# Patient Record
Sex: Female | Born: 1985 | Race: White | Hispanic: No | Marital: Married | Smoking: Never smoker
Health system: Southern US, Community
[De-identification: ages and names within clinical notes are randomized; demographics above are authoritative.]

## PROBLEM LIST (undated history)

## (undated) ENCOUNTER — Inpatient Hospital Stay (HOSPITAL_COMMUNITY): Payer: Self-pay

## (undated) DIAGNOSIS — Z8639 Personal history of other endocrine, nutritional and metabolic disease: Secondary | ICD-10-CM

## (undated) DIAGNOSIS — Z9882 Breast implant status: Secondary | ICD-10-CM

## (undated) DIAGNOSIS — Z9089 Acquired absence of other organs: Secondary | ICD-10-CM

## (undated) DIAGNOSIS — Z8249 Family history of ischemic heart disease and other diseases of the circulatory system: Secondary | ICD-10-CM

## (undated) DIAGNOSIS — I7300 Raynaud's syndrome without gangrene: Secondary | ICD-10-CM

## (undated) DIAGNOSIS — Z818 Family history of other mental and behavioral disorders: Secondary | ICD-10-CM

## (undated) DIAGNOSIS — Z833 Family history of diabetes mellitus: Secondary | ICD-10-CM

## (undated) DIAGNOSIS — R7303 Prediabetes: Secondary | ICD-10-CM

## (undated) DIAGNOSIS — Z9049 Acquired absence of other specified parts of digestive tract: Secondary | ICD-10-CM

## (undated) DIAGNOSIS — F419 Anxiety disorder, unspecified: Secondary | ICD-10-CM

## (undated) DIAGNOSIS — Z8742 Personal history of other diseases of the female genital tract: Secondary | ICD-10-CM

## (undated) DIAGNOSIS — Z124 Encounter for screening for malignant neoplasm of cervix: Secondary | ICD-10-CM

## (undated) DIAGNOSIS — M109 Gout, unspecified: Secondary | ICD-10-CM

## (undated) DIAGNOSIS — Z9889 Other specified postprocedural states: Secondary | ICD-10-CM

## (undated) HISTORY — DX: Acquired absence of other specified parts of digestive tract: Z90.49

## (undated) HISTORY — DX: Family history of ischemic heart disease and other diseases of the circulatory system: Z82.49

## (undated) HISTORY — PX: BREAST ENHANCEMENT SURGERY: SHX7

## (undated) HISTORY — DX: Raynaud's syndrome without gangrene: I73.00

## (undated) HISTORY — DX: Encounter for screening for malignant neoplasm of cervix: Z12.4

## (undated) HISTORY — DX: Prediabetes: R73.03

## (undated) HISTORY — DX: Gout, unspecified: M10.9

## (undated) HISTORY — DX: Acquired absence of other organs: Z90.89

## (undated) HISTORY — PX: AUGMENTATION MAMMAPLASTY: SUR837

## (undated) HISTORY — DX: Personal history of other endocrine, nutritional and metabolic disease: Z86.39

## (undated) HISTORY — PX: OTHER SURGICAL HISTORY: SHX169

## (undated) HISTORY — PX: CHOLECYSTECTOMY: SHX55

## (undated) HISTORY — DX: Family history of diabetes mellitus: Z83.3

## (undated) HISTORY — DX: Anxiety disorder, unspecified: F41.9

## (undated) HISTORY — DX: Breast implant status: Z98.82

## (undated) HISTORY — DX: Personal history of other diseases of the female genital tract: Z87.42

## (undated) HISTORY — DX: Other specified postprocedural states: Z98.890

## (undated) HISTORY — DX: Family history of other mental and behavioral disorders: Z81.8

## (undated) HISTORY — PX: TONSILLECTOMY: SUR1361

---

## 2015-04-10 ENCOUNTER — Encounter: Payer: Self-pay | Admitting: Osteopathic Medicine

## 2015-04-10 ENCOUNTER — Ambulatory Visit (INDEPENDENT_AMBULATORY_CARE_PROVIDER_SITE_OTHER): Payer: 59 | Admitting: Osteopathic Medicine

## 2015-04-10 VITALS — BP 121/85 | HR 86 | Ht 70.0 in | Wt 253.0 lb

## 2015-04-10 DIAGNOSIS — Z9089 Acquired absence of other organs: Secondary | ICD-10-CM

## 2015-04-10 DIAGNOSIS — Z9049 Acquired absence of other specified parts of digestive tract: Secondary | ICD-10-CM

## 2015-04-10 DIAGNOSIS — Z8639 Personal history of other endocrine, nutritional and metabolic disease: Secondary | ICD-10-CM

## 2015-04-10 DIAGNOSIS — Z818 Family history of other mental and behavioral disorders: Secondary | ICD-10-CM

## 2015-04-10 DIAGNOSIS — F321 Major depressive disorder, single episode, moderate: Secondary | ICD-10-CM

## 2015-04-10 DIAGNOSIS — Z9882 Breast implant status: Secondary | ICD-10-CM

## 2015-04-10 DIAGNOSIS — Z833 Family history of diabetes mellitus: Secondary | ICD-10-CM

## 2015-04-10 DIAGNOSIS — F411 Generalized anxiety disorder: Secondary | ICD-10-CM

## 2015-04-10 DIAGNOSIS — Z124 Encounter for screening for malignant neoplasm of cervix: Secondary | ICD-10-CM

## 2015-04-10 DIAGNOSIS — Z9889 Other specified postprocedural states: Secondary | ICD-10-CM

## 2015-04-10 DIAGNOSIS — Z8742 Personal history of other diseases of the female genital tract: Secondary | ICD-10-CM

## 2015-04-10 DIAGNOSIS — Z8249 Family history of ischemic heart disease and other diseases of the circulatory system: Secondary | ICD-10-CM

## 2015-04-10 HISTORY — DX: Breast implant status: Z98.82

## 2015-04-10 HISTORY — DX: Encounter for screening for malignant neoplasm of cervix: Z12.4

## 2015-04-10 HISTORY — DX: Acquired absence of other organs: Z90.89

## 2015-04-10 HISTORY — DX: Personal history of other diseases of the female genital tract: Z87.42

## 2015-04-10 HISTORY — DX: Personal history of other endocrine, nutritional and metabolic disease: Z86.39

## 2015-04-10 HISTORY — DX: Other specified postprocedural states: Z98.890

## 2015-04-10 HISTORY — DX: Acquired absence of other specified parts of digestive tract: Z90.49

## 2015-04-10 HISTORY — DX: Family history of diabetes mellitus: Z83.3

## 2015-04-10 HISTORY — DX: Family history of other mental and behavioral disorders: Z81.8

## 2015-04-10 HISTORY — DX: Family history of ischemic heart disease and other diseases of the circulatory system: Z82.49

## 2015-04-10 MED ORDER — HYDROXYZINE HCL 25 MG PO TABS: 25.0000 mg | ORAL_TABLET | Freq: Three times a day (TID) | ORAL | Status: DC | PRN

## 2015-04-10 MED ORDER — PAROXETINE HCL 30 MG PO TABS: 15.0000 mg | ORAL_TABLET | Freq: Every day | ORAL | Status: DC

## 2015-04-10 NOTE — Progress Notes (Signed)
HPI: Regina Andrews is a 30 y.o. female who presents to [facility] today for chief complaint of:  Chief Complaint  Patient presents with  . Establish Care  . Anxiety     New patient here to establish care, seeking medication to help with significant anxiety issues.  Previous medications - Wellbutrin a year and a half ago. Has tried [employer] Wort, GABA, herbal tea, aromatherapy, breathing techniques. Also taking Melatonin or Z-quil tohelp sleep. Possible "slight ADHD" diagnosis as a child. Agoraphobia. Patient unable to perform daily tasks such as going to the grocery store, taking her son to doctor's appointments.  Recently moved from [place]. Had better support system with family in [place], has been now is supportive and encouraging.  Generalized Anxiety Evaluation:  Excessive anxiety/worry 6 mos+: Yes  Home and work/school: Yes  Difficulty to control: Yes  Associated symptoms (3/adult, 1/child):     Restlessness/on edge Yes      Fatigue Yes      Concentration Yes      Irritable Yes      Muscle tension No      Sleep disturbance Yes  Imparled social/occupational functioning: Yes   Any secondary cause or other concerns?   Major depression Concern  Personality d/o No concern  Social anxiety d/o Concern  Obsessive Compulsive d/o No concern  PTSD No concern  Eating d/o or Body Dysmorphic d/o No concern   Major Depression Evaluation: One of the following for 2wk, most of the day on most days?  Depression: Yes    Anhedonia/reduced interest: Yes  Four of the following, most of the day on most days?  Weight change unintentional: No   Sleep pattern change: Yes   Psychomotor symptom: Yes   Fatigue: Yes   Worthlessness/guilt: Yes   Concentration problems: Yes   Thoughts of death/suicide: No  Concern for other causes?  Drug-induced problems: No   Bipolar disorder: No   Delusional: No   Hallucinations: No   Bereavement/grief: No    Hypothyroid: await labs  Alcohol: No    Past medical, social and family history reviewed: Past Medical History  Diagnosis Date  . Cervical cancer screening 04/10/2015    Hx abn Pap 05/2014   . Family history of depression 04/10/2015    both parents   . Family history of diabetes mellitus in first degree relative 04/10/2015    mother   . Family history of heart disease 04/10/2015  . History of abdominoplasty 04/10/2015  . History of breast augmentation 04/10/2015  . History of cholecystectomy 04/10/2015  . History of goiter 04/10/2015  . History of PCOS 04/10/2015  . History of tonsillectomy 04/10/2015   No past surgical history on file. Social History  Substance Use Topics  . Smoking status: Never Smoker   . Smokeless tobacco: Not on file  . Alcohol Use: Not on file   No family history on file.  No current outpatient prescriptions on file.   No current facility-administered medications for this visit.   Not on File    Review of Systems: CONSTITUTIONAL:  No  fever, no chills, No  unintentional weight changes HEAD/EYES/EARS/NOSE/THROAT: (+) headache, no vision change, no hearing change, No  sore throat, No  sinus pressure, (+) ringing in ears/difficulty hearing  CARDIAC: No  chest pain, No  pressure, No palpitations, No  orthopnea RESPIRATORY: No  cough, No  shortness of breath/wheeze GASTROINTESTINAL: (+) nausea, no vomiting, no abdominal pain, (+) bright blood in stool, no  diarrhea, No  constipation  MUSCULOSKELETAL: No  myalgia/arthralgia GENITOURINARY: No  incontinence, No  abnormal genital bleeding/discharge SKIN: No  rash/wounds/concerning lesions HEM/ONC: No  easy bruising/bleeding, No  abnormal lymph node ENDOCRINE: No polyuria/polydipsia/polyphagia, No  heat/cold intolerance  NEUROLOGIC: No  weakness, No  dizziness, No  slurred speech PSYCHIATRIC: (+) concerns with depression, (+) concerns with anxiety, (+) sleep problems  Exam:  BP 121/85 mmHg  Pulse 86  Ht 5'  10" (1.778 m)  Wt 253 lb (114.76 kg)  BMI 36.30 kg/m2 Constitutional: VS see above. General Appearance: alert, well-developed, well-nourished, NAD Eyes: Normal lids and conjunctive, non-icteric sclera,  Ears, Nose, Mouth, Throat: MMM, Normal external inspection ears/nares/mouth/lips/gums Neck: No masses, trachea midline. No thyroid enlargement/tenderness/mass appreciated. No lymphadenopathy Respiratory: Normal respiratory effort. no wheeze, no rhonchi, no rales Cardiovascular: S1/S2 normal, no murmur, no rub/gallop auscultated. RRR.  No lower extremity edema. Skin: warm, dry, intact. No rash/ulcer. No concerning nevi or subq nodules on limited exam.   Psychiatric: Normal judgment/insight. Anxious mood and affect. Oriented x3.     ASSESSMENT/PLAN: Generalized anxiety disorder with significant impaired social and personal functioning. Her depressive disorder. Panic attacks. We'll start low-dose medicine as below, patient to call/message in 2 weeks and we can discuss increasing doses if she is tolerating the medicines well, versus switching if not. Call sooner if any concerning side effects. She is advised to stop St. John's wort and over-the-counter diet pill. ER/RTC precautions reviewed.   At next visit, will further address history of abnormal Pap once a reviewed records from GYN, also address other GI issues.   Generalized anxiety disorder - Plan: PARoxetine (PAXIL) 30 MG tablet, hydrOXYzine (ATARAX/VISTARIL) 25 MG tablet, Ambulatory referral to Psychology  Major depressive disorder, single episode, moderate (HCC)  Cervical cancer screening - Hx abn Pap 05/2014  History of PCOS  History of goiter  Family history of depression - both parents  Family history of diabetes mellitus in first degree relative - mother  Family history of heart disease  History of breast augmentation  History of abdominoplasty  History of cholecystectomy  History of tonsillectomy   Return in about 4  weeks (around 05/08/2015) for MEDICATION MANAGEMENT - OV30.

## 2015-04-10 NOTE — Patient Instructions (Signed)
You should expect a call within one week about the following:  After Dr. Lyn Hollingshead has reviewed records from previous PCP and OB/GYN, we'll see if we need to update any labs, will also give further instructions about follow-up for abnormal Pap.  You should also receive a call about scheduling an appointment with behavioral health for counseling  Please let us know right away, either by calling the office or sending a secure message through my chart, if you are experiencing any side effects that you are unable to tolerate, keeping in mind that most side effects from the new medicine will go away on their own within a week or 2. The most common problems are nausea or abdominal discomfort. If you are experiencing thoughts of hurting herself or others, call 911 or go to an emergency room right away.  Please let us know, either by calling the office or sending a secure message through my chart, after 2 weeks on the medicines to let us know how you're doing. At that point we can talk about possibly increasing the dose or switching to a different medication if you're having problems with side effects. Again, please let us know ASAP if you have a serious concern about side effect, do not wait for 2 weeks.   Please see below for further information on the medications. The list of possible side effects is provided with each, please let us know if you have any concerns about these, recognizing that is because a side effect is listed as a possibility does not guarantee that it will happen. Every patient needs to work with her doctor to discuss side effects versus benefits of any medicine.   Take care, and let us know if there is anything else we can do for you! If everything is going okay, let's plan to meet up again in the office in 4 weeks!  -Dr. Mervyn Skeeters.      Paroxetine tablets What is this medicine? PAROXETINE (pa ROX e teen) is used to treat depression. It may also be used to treat anxiety disorders,  obsessive compulsive disorder, panic attacks, post traumatic stress, and premenstrual dysphoric disorder (PMDD). This medicine may be used for other purposes; ask your health care provider or pharmacist if you have questions. How should I use this medicine? Take this medicine by mouth with a glass of water. Follow the directions on the prescription label. You can take it with or without food. Take your medicine at regular intervals. Do not take your medicine more often than directed. Do not stop taking this medicine suddenly except upon the advice of your doctor. Stopping this medicine too quickly may cause serious side effects or your condition may worsen. A special MedGuide will be given to you by the pharmacist with each prescription and refill. Be sure to read this information carefully each time. Talk to your pediatrician regarding the use of this medicine in children. Special care may be needed. Overdosage: If you think you have taken too much of this medicine contact a poison control center or emergency room at once. NOTE: This medicine is only for you. Do not share this medicine with others. What if I miss a dose? If you miss a dose, take it as soon as you can. If it is almost time for your next dose, take only that dose. Do not take double or extra doses. What may interact with this medicine? Do not take this medicine with any of the following medications: -linezolid -MAOIs like Carbex, Eldepryl,  Marplan, Nardil, and Parnate -methylene blue (injected into a vein) -pimozide -thioridazine This medicine may also interact with the following medications: -alcohol -aspirin and aspirin-like medicines -atomoxetine -certain medicines for depression, anxiety, or psychotic disturbances -certain medicines for irregular heart beat like propafenone, flecainide, encainide, and quinidine -certain medicines for migraine headache like almotriptan, eletriptan, frovatriptan, naratriptan, rizatriptan,  sumatriptan, zolmitriptan -cimetidine -digoxin -diuretics -fentanyl -fosamprenavir/ritonavir -furazolidone -isoniazid -lithium -medicines that treat or prevent blood clots like warfarin, enoxaparin, and dalteparin -medicines for sleep -metoprolol -NSAIDs, medicines for pain and inflammation, like ibuprofen or naproxen -phenobarbital -phenytoin -procarbazine -procyclidine -rasagiline -supplements like St. John's wort, kava kava, valerian -tamoxifen -theophylline -tramadol -tryptophan This list may not describe all possible interactions. Give your health care provider a list of all the medicines, herbs, non-prescription drugs, or dietary supplements you use. Also tell them if you smoke, drink alcohol, or use illegal drugs. Some items may interact with your medicine. What should I watch for while using this medicine? Tell your doctor if your symptoms do not get better or if they get worse. Visit your doctor or health care professional for regular checks on your progress. Because it may take several weeks to see the full effects of this medicine, it is important to continue your treatment as prescribed by your doctor. Patients and their families should watch out for new or worsening thoughts of suicide or depression. Also watch out for sudden changes in feelings such as feeling anxious, agitated, panicky, irritable, hostile, aggressive, impulsive, severely restless, overly excited and hyperactive, or not being able to sleep. If this happens, especially at the beginning of treatment or after a change in dose, call your health care professional. Bonita Quin may get drowsy or dizzy. Do not drive, use machinery, or do anything that needs mental alertness until you know how this medicine affects you. Do not stand or sit up quickly, especially if you are an older patient. This reduces the risk of dizzy or fainting spells. Alcohol may interfere with the effect of this medicine. Avoid alcoholic drinks. Your  mouth may get dry. Chewing sugarless gum or sucking hard candy, and drinking plenty of water will help. Contact your doctor if the problem does not go away or is severe. What side effects may I notice from receiving this medicine? Side effects that you should report to your doctor or health care professional as soon as possible: -allergic reactions like skin rash, itching or hives, swelling of the face, lips, or tongue -black or bloody stools, blood in the urine or vomit -fast, irregular heartbeat -hallucination, loss of contact with reality -painful or prolonged erection (men) -seizures -suicidal thoughts or other mood changes -trouble passing urine or change in the amount of urine -unusual bleeding or bruising -unusually weak or tired -vomiting Side effects that usually do not require medical attention (report to your doctor or health care professional if they continue or are bothersome): -change in appetite, weight -change in sex drive or performance -constipation or diarrhea -difficulty sleeping -drowsy -headache -increased sweating -muscle pain or weakness -tremors This list may not describe all possible side effects. Call your doctor for medical advice about side effects. You may report side effects to FDA at 1-800-FDA-1088. Where should I keep my medicine? Keep out of the reach of children. Store at room temperature between 15 and 30 degrees C (59 and 86 degrees F). Keep container tightly closed. Throw away any unused medicine after the expiration date. NOTE: This sheet is a summary. It may not cover all possible  information. If you have questions about this medicine, talk to your doctor, pharmacist, or health care provider.    2016, Elsevier/Gold Standard. (2011-08-27 18:10:02)   Hydroxyzine capsules or tablets What is this medicine? HYDROXYZINE (hye DROX i zeen) is an antihistamine. This medicine is used to treat allergy symptoms. It is also used to treat anxiety and  tension. This medicine can be used with other medicines to induce sleep before surgery. This medicine may be used for other purposes; ask your health care provider or pharmacist if you have questions. How should I use this medicine? Take this medicine by mouth with a full glass of water. Follow the directions on the prescription label. You may take this medicine with food or on an empty stomach. Take your medicine at regular intervals. Do not take your medicine more often than directed. Overdosage: If you think you have taken too much of this medicine contact a poison control center or emergency room at once. NOTE: This medicine is only for you. Do not share this medicine with others. What if I miss a dose? If you miss a dose, take it as soon as you can. If it is almost time for your next dose, take only that dose. Do not take double or extra doses. What may interact with this medicine? -alcohol -barbiturate medicines for sleep or seizures -medicines for colds, allergies -medicines for depression, anxiety, or emotional disturbances -medicines for pain -medicines for sleep -muscle relaxants This list may not describe all possible interactions. Give your health care provider a list of all the medicines, herbs, non-prescription drugs, or dietary supplements you use. Also tell them if you smoke, drink alcohol, or use illegal drugs. Some items may interact with your medicine. What should I watch for while using this medicine? Tell your doctor or health care professional if your symptoms do not improve. You may get drowsy or dizzy. Do not drive, use machinery, or do anything that needs mental alertness until you know how this medicine affects you. Do not stand or sit up quickly, especially if you are an older patient. This reduces the risk of dizzy or fainting spells. Alcohol may interfere with the effect of this medicine. Avoid alcoholic drinks. Your mouth may get dry. Chewing sugarless gum or sucking  hard candy, and drinking plenty of water may help. Contact your doctor if the problem does not go away or is severe. This medicine may cause dry eyes and blurred vision. If you wear contact lenses you may feel some discomfort. Lubricating drops may help. See your eye doctor if the problem does not go away or is severe. If you are receiving skin tests for allergies, tell your doctor you are using this medicine. What side effects may I notice from receiving this medicine? Side effects that you should report to your doctor or health care professional as soon as possible: -fast or irregular heartbeat -difficulty passing urine -seizures -slurred speech or confusion -tremor Side effects that usually do not require medical attention (report to your doctor or health care professional if they continue or are bothersome): -constipation -drowsiness -fatigue -headache -stomach upset This list may not describe all possible side effects. Call your doctor for medical advice about side effects. You may report side effects to FDA at 1-800-FDA-1088. Where should I keep my medicine? Keep out of the reach of children. Store at room temperature between 15 and 30 degrees C (59 and 86 degrees F). Keep container tightly closed. Throw away any unused medicine after the  expiration date. NOTE: This sheet is a summary. It may not cover all possible information. If you have questions about this medicine, talk to your doctor, pharmacist, or health care provider.    2016, Elsevier/Gold Standard. (2007-05-27 14:50:59)

## 2015-04-10 NOTE — Progress Notes (Signed)
GAD 7 : Generalized Anxiety Score 04/10/2015  Nervous, Anxious, on Edge 3  Control/stop worrying 3  Worry too much - different things 3  Trouble relaxing 3  Restless 3  Easily annoyed or irritable 3  Afraid - awful might happen 3  Total GAD 7 Score 21  Anxiety Difficulty Extremely difficult     Depression screen PHQ 2/9 04/10/2015  Decreased Interest 3  Down, Depressed, Hopeless 3  PHQ - 2 Score 6  Altered sleeping 3  Tired, decreased energy 3  Change in appetite 3  Feeling bad or failure about yourself  3  Trouble concentrating 2  Moving slowly or fidgety/restless 2  Suicidal thoughts 0  PHQ-9 Score 22  Difficult doing work/chores Very difficult

## 2015-04-16 ENCOUNTER — Encounter: Payer: Self-pay | Admitting: Osteopathic Medicine

## 2015-04-16 ENCOUNTER — Ambulatory Visit (INDEPENDENT_AMBULATORY_CARE_PROVIDER_SITE_OTHER): Payer: 59 | Admitting: Osteopathic Medicine

## 2015-04-16 VITALS — BP 109/75 | HR 75 | Ht 70.0 in | Wt 257.0 lb

## 2015-04-16 DIAGNOSIS — N926 Irregular menstruation, unspecified: Secondary | ICD-10-CM | POA: Diagnosis not present

## 2015-04-16 DIAGNOSIS — Z8742 Personal history of other diseases of the female genital tract: Secondary | ICD-10-CM

## 2015-04-16 DIAGNOSIS — F411 Generalized anxiety disorder: Secondary | ICD-10-CM | POA: Diagnosis not present

## 2015-04-16 LAB — POCT URINE PREGNANCY: PREG TEST UR: NEGATIVE

## 2015-04-16 NOTE — Progress Notes (Signed)
HPI: Regina Andrews is a 30 y.o. female who [facility] to [facility] today for chief complaint of:  Chief Complaint  Patient presents with  . (+) pregnancy test at home    Recently seen in office, Dx GAD and started Paxil and Hydroxyzine, referred to counseling. Pt had (+) pregnancy test at home and is concerned that the new medications caused this to happen. Patient's last menstrual period was 03/13/2015 (exact date). Last had sex 04/01/15. Periods fairly regular since losing some weight. Hx PCOS "borderline" in the past.   Past medical, social and family history reviewed: Past Medical History  Diagnosis Date  . Cervical cancer screening 04/10/2015    Hx abn Pap 05/2014   . Family history of depression 04/10/2015    both parents   . Family history of diabetes mellitus in first degree relative 04/10/2015    mother   . Family history of heart disease 04/10/2015  . History of abdominoplasty 04/10/2015  . History of breast augmentation 04/10/2015  . History of cholecystectomy 04/10/2015  . History of goiter 04/10/2015  . History of PCOS 04/10/2015  . History of tonsillectomy 04/10/2015   Past Surgical History  Procedure Laterality Date  . Breast enhancement surgery    . Tummy tuck    . Cholecystectomy    . Tonsillectomy     Social History  Substance Use Topics  . Smoking status: Never Smoker   . Smokeless tobacco: Not on file  . Alcohol Use: Not on file   Family History  Problem Relation Age of Onset  . Depression Mother   . Diabetes Mother   . Depression Father   . Hyperlipidemia Father   . Hypertension Father   . Cancer Paternal Aunt     Current Outpatient Prescriptions  Medication Sig Dispense Refill  . 5-Hydroxytryptophan (5-HTP MAXIMUM STRENGTH) 200 MG CAPS Take 200 mg by mouth daily.    . Calcium Carb-Cholecalciferol (CALCIUM + D3) 600-200 MG-UNIT TABS Take by mouth daily. Take 1 tablet daily    . Cholecalciferol (VITAMIN D3) 2000 units TABS  Take by mouth daily.    . Coenzyme Q10 (CO Q-10) 100 MG CAPS Take 100 mg by mouth daily.    . Ferrous Sulfate (IRON) 325 (65 Fe) MG TABS Take by mouth daily.    . folic acid (FOLVITE) 800 MCG tablet Take 400 mcg by mouth daily.    . hydrOXYzine (ATARAX/VISTARIL) 25 MG tablet Take 1-2 tablets (25-50 mg total) by mouth 3 (three) times daily as needed for anxiety (insomnia). 90 tablet 1  . L-Glutamine 500 MG TABS Take by mouth.    . Lutein 20 MG TABS Take 20 mg by mouth daily. TAKE 1 TABLET DAILY    . Magnesium 400 MG TABS Take 400 mg by mouth daily.    . Melatonin 5 MG TABS Take 5 mg by mouth daily.    . Omega-3 Fatty Acids (FISH OIL) 1000 MG CAPS Take 1,000 mg by mouth daily.    [name]. PARoxetine (PAXIL) 30 MG tablet Take 0.5 tablets (15 mg total) by mouth daily. Can increase to 1 tablet (30 mg total) after 2 (two) weeks if you are tolerating the medicine well but not getting much relief of symptoms 30 tablet 1  . Selenium (SELENICAPS-200 PO) Take 200 mg by mouth daily.    . Zinc 50 MG TABS Take 50 mg by mouth daily.     No current facility-administered medications for this visit.   No  Known Allergies    Review of Systems: CONSTITUTIONAL:  No  fever, no chills, No  unintentional weight changes CARDIAC: No  chest pain GASTROINTESTINAL: No  nausea, No  vomiting, No  abdominal pain,  GENITOURINARY: No  incontinence, No  abnormal genital bleeding/discharge, period a bit last we per HPI PSYCHIATRIC: No  concerns with depression, (+) concerns with anxiety, No sleep problems  Exam:  BP 109/75 mmHg  Pulse 75  Ht  (1.778 m)  Wt 257 lb (116.574 kg)  BMI 36.88 kg/m2 Constitutional: VS see above. General Appearance: alert, well-developed, well-nourished, NAD Psychiatric: Normal judgment/insight. Normal mood and affect. Oriented x3.    Results for orders placed or performed in visit on 04/16/15 (from the past 72 hour(s))  POCT urine pregnancy     Status: None   Collection Time: 04/16/15   9:47 AM  Result Value Ref Range   Preg Test, Ur Negative Negative      ASSESSMENT/PLAN: UPT neg, pt reassured, if no period in one week will get serum hcg, advised current meds relatively safe for pregnancy but I cannot guarantee safety of herbal OTC meds. Birth control importance discussed - pt opts for condom use/abstinence, will think about pills.   Missed period - Plan: POCT urine pregnancy  Generalized anxiety disorder  History of PCOS     No Follow-up on file.

## 2015-04-23 ENCOUNTER — Ambulatory Visit (INDEPENDENT_AMBULATORY_CARE_PROVIDER_SITE_OTHER): Payer: 59 | Admitting: Osteopathic Medicine

## 2015-04-23 ENCOUNTER — Encounter: Payer: Self-pay | Admitting: Osteopathic Medicine

## 2015-04-23 ENCOUNTER — Telehealth: Payer: Self-pay | Admitting: Osteopathic Medicine

## 2015-04-23 VITALS — BP 117/80 | HR 103 | Ht 70.0 in | Wt 257.0 lb

## 2015-04-23 DIAGNOSIS — N926 Irregular menstruation, unspecified: Secondary | ICD-10-CM | POA: Diagnosis not present

## 2015-04-23 DIAGNOSIS — F411 Generalized anxiety disorder: Secondary | ICD-10-CM | POA: Diagnosis not present

## 2015-04-23 DIAGNOSIS — Z3481 Encounter for supervision of other normal pregnancy, first trimester: Secondary | ICD-10-CM

## 2015-04-23 LAB — POCT URINE PREGNANCY: PREG TEST UR: POSITIVE — AB

## 2015-04-23 MED ORDER — SERTRALINE HCL 50 MG PO TABS: 50.0000 mg | ORAL_TABLET | Freq: Every day | ORAL | Status: DC

## 2015-04-23 NOTE — Patient Instructions (Signed)
We will set up referral for OB/GYN in this building - you can walk in to the office and schedule an appointment if you would like.  We will look into the referral for behavioral health/mental health counseling - there are offices on the first floor of this building, he can also walk into that office and inquire about the referral today if you would like.   We have placed orders for blood tests to confirm pregnancy hormone levels and ultrasound for accurate dating of pregnancy. We can get this blood work done at [facility]. Ultrasound will call you to schedule an appointment.   We are switching to Zoloft, discontinue the Paxil. Start Zoloft at one half tablet daily for a week before going up to a full tablet. Any problems with this medicine, please let us know right away. Any thoughts of hurting yourself or others, call 911 or go to the nearest emergency department.  Any questions or concerns, please let us know! Left plan to follow-up in another month or so to address medications, sooner if needed or if any problems.

## 2015-04-23 NOTE — Telephone Encounter (Signed)
Received message patient is scheduled with Regina Andrews [facility] and is aware of the appointment . - CF

## 2015-04-23 NOTE — Progress Notes (Signed)
HPI: Regina Andrews is a 30 y.o. female who presents to [facility] today for chief complaint of: No chief complaint on file.   Preg test was neg alst week, plan was to get serum hcg if still no periods, pt came in today and preg test here (+). Plans to continue w/ pregnancy.   ANXIETY -    Past medical, social and family history reviewed: Past Medical History  Diagnosis Date  . Cervical cancer screening 04/10/2015    Hx abn Pap 05/2014   . Family history of depression 04/10/2015    both parents   . Family history of diabetes mellitus in first degree relative 04/10/2015    mother   . Family history of heart disease 04/10/2015  . History of abdominoplasty 04/10/2015  . History of breast augmentation 04/10/2015  . History of cholecystectomy 04/10/2015  . History of goiter 04/10/2015  . History of PCOS 04/10/2015  . History of tonsillectomy 04/10/2015   Past Surgical History  Procedure Laterality Date  . Breast enhancement surgery    . Tummy tuck    . Cholecystectomy    . Tonsillectomy     Social History  Substance Use Topics  . Smoking status: Never Smoker   . Smokeless tobacco: Not on file  . Alcohol Use: Not on file   Family History  Problem Relation Age of Onset  . Depression Mother   . Diabetes Mother   . Depression Father   . Hyperlipidemia Father   . Hypertension Father   . Cancer Paternal Aunt     Current Outpatient Prescriptions  Medication Sig Dispense Refill  . 5-Hydroxytryptophan (5-HTP MAXIMUM STRENGTH) 200 MG CAPS Take 200 mg by mouth daily.    . Calcium Carb-Cholecalciferol (CALCIUM + D3) 600-200 MG-UNIT TABS Take by mouth daily. Take 1 tablet daily    . Cholecalciferol (VITAMIN D3) 2000 units TABS Take by mouth daily.    . Coenzyme Q10 (CO Q-10) 100 MG CAPS Take 100 mg by mouth daily.    . Ferrous Sulfate (IRON) 325 (65 Fe) MG TABS Take by mouth daily.    . folic acid (FOLVITE) 800 MCG tablet Take 400 mcg by mouth daily.    .  hydrOXYzine (ATARAX/VISTARIL) 25 MG tablet Take 1-2 tablets (25-50 mg total) by mouth 3 (three) times daily as needed for anxiety (insomnia). 90 tablet 1  . L-Glutamine 500 MG TABS Take by mouth.    . Lutein 20 MG TABS Take 20 mg by mouth daily. TAKE 1 TABLET DAILY    . Magnesium 400 MG TABS Take 400 mg by mouth daily.    . Melatonin 5 MG TABS Take 5 mg by mouth daily.    . Omega-3 Fatty Acids (FISH OIL) 1000 MG CAPS Take 1,000 mg by mouth daily.    [name] PARoxetine (PAXIL) 30 MG tablet Take 0.5 tablets (15 mg total) by mouth daily. Can increase to 1 tablet (30 mg total) after 2 (two) weeks if you are tolerating the medicine well but not getting much relief of symptoms 30 tablet 1  . Selenium (SELENICAPS-200 PO) Take 200 mg by mouth daily.    . Zinc 50 MG TABS Take 50 mg by mouth daily.     No current facility-administered medications for this visit.   No Known Allergies    Review of Systems: CONSTITUTIONAL:  No  fever, no chills, No  unintentional weight changes GENITOURINARY:  No  abnormal genital bleeding/discharge PSYCHIATRIC: No  concerns with  depression, (+) concerns with anxiety, (+) sleep problems  Exam:  BP 117/80 mmHg  Pulse 103  Ht 5\' 10"  (1.778 m)  Wt 257 lb (116.574 kg)  BMI 36.88 kg/m2  LMP 03/13/2015 (Exact Date) Constitutional: VS see above. General Appearance: alert, well-developed, well-nourished, NAD Psychiatric: Normal judgment/insight. Anxiousmood and affect. Oriented x3.    Results for orders placed or performed in visit on 04/23/15 (from the past 72 hour(s))  POCT urine pregnancy     Status: Abnormal   Collection Time: 04/23/15  9:18 AM  Result Value Ref Range   Preg Test, Ur Positive (A) Negative      ASSESSMENT/PLAN:  Missed period - Plan: POCT urine pregnancy  Generalized anxiety disorder - Plan: sertraline (ZOLOFT) 50 MG tablet  Encounter for supervision of other normal pregnancy in first trimester - Plan: Ambulatory referral to Obstetrics /  Gynecology, hCG, serum, qualitative, US OB Comp Less 14 Wks   Return in about 4 weeks (around 05/21/2015), or sooner if needed, for MEDICATION MANAGEMENT.

## 2015-04-24 ENCOUNTER — Encounter: Payer: Self-pay | Admitting: Osteopathic Medicine

## 2015-04-24 ENCOUNTER — Telehealth: Payer: Self-pay | Admitting: Osteopathic Medicine

## 2015-04-24 LAB — HCG, SERUM, QUALITATIVE: Preg, Serum: POSITIVE — ABNORMAL HIGH

## 2015-04-24 NOTE — Telephone Encounter (Signed)
error 

## 2015-04-25 ENCOUNTER — Ambulatory Visit: Payer: 59 | Admitting: Licensed Clinical Social Worker

## 2015-04-26 ENCOUNTER — Other Ambulatory Visit: Payer: Self-pay | Admitting: Osteopathic Medicine

## 2015-04-26 ENCOUNTER — Ambulatory Visit (INDEPENDENT_AMBULATORY_CARE_PROVIDER_SITE_OTHER): Payer: 59

## 2015-04-26 DIAGNOSIS — Z3490 Encounter for supervision of normal pregnancy, unspecified, unspecified trimester: Secondary | ICD-10-CM

## 2015-04-26 DIAGNOSIS — Z3481 Encounter for supervision of other normal pregnancy, first trimester: Secondary | ICD-10-CM

## 2015-04-26 DIAGNOSIS — O3481 Maternal care for other abnormalities of pelvic organs, first trimester: Secondary | ICD-10-CM | POA: Diagnosis not present

## 2015-04-26 DIAGNOSIS — Z3A01 Less than 8 weeks gestation of pregnancy: Secondary | ICD-10-CM

## 2015-04-26 DIAGNOSIS — R58 Hemorrhage, not elsewhere classified: Secondary | ICD-10-CM | POA: Diagnosis not present

## 2015-05-08 ENCOUNTER — Ambulatory Visit: Payer: 59 | Admitting: Osteopathic Medicine

## 2015-05-09 ENCOUNTER — Ambulatory Visit (INDEPENDENT_AMBULATORY_CARE_PROVIDER_SITE_OTHER): Payer: 59 | Admitting: Licensed Clinical Social Worker

## 2015-05-09 DIAGNOSIS — F410 Panic disorder [episodic paroxysmal anxiety] without agoraphobia: Secondary | ICD-10-CM

## 2015-05-13 ENCOUNTER — Ambulatory Visit (INDEPENDENT_AMBULATORY_CARE_PROVIDER_SITE_OTHER): Payer: 59 | Admitting: Advanced Practice Midwife

## 2015-05-13 ENCOUNTER — Encounter: Payer: Self-pay | Admitting: Advanced Practice Midwife

## 2015-05-13 VITALS — BP 112/75 | HR 82 | Wt 249.0 lb

## 2015-05-13 DIAGNOSIS — Z3481 Encounter for supervision of other normal pregnancy, first trimester: Secondary | ICD-10-CM

## 2015-05-13 DIAGNOSIS — Z124 Encounter for screening for malignant neoplasm of cervix: Secondary | ICD-10-CM | POA: Diagnosis not present

## 2015-05-13 DIAGNOSIS — Z1151 Encounter for screening for human papillomavirus (HPV): Secondary | ICD-10-CM | POA: Diagnosis not present

## 2015-05-13 DIAGNOSIS — Z113 Encounter for screening for infections with a predominantly sexual mode of transmission: Secondary | ICD-10-CM

## 2015-05-13 DIAGNOSIS — Z36 Encounter for antenatal screening of mother: Secondary | ICD-10-CM | POA: Diagnosis not present

## 2015-05-13 DIAGNOSIS — E049 Nontoxic goiter, unspecified: Secondary | ICD-10-CM

## 2015-05-13 DIAGNOSIS — Z3480 Encounter for supervision of other normal pregnancy, unspecified trimester: Secondary | ICD-10-CM

## 2015-05-13 DIAGNOSIS — Z3491 Encounter for supervision of normal pregnancy, unspecified, first trimester: Secondary | ICD-10-CM

## 2015-05-13 HISTORY — DX: Encounter for supervision of other normal pregnancy, unspecified trimester: Z34.80

## 2015-05-13 NOTE — Patient Instructions (Signed)
First Trimester of Pregnancy The first trimester of pregnancy is from week 1 until the end of week 12 (months 1 through 3). A week after a sperm fertilizes an egg, the egg will implant on the wall of the uterus. This embryo will begin to develop into a baby. Genes from you and your partner are forming the baby. The female genes determine whether the baby is a boy or a girl. At 6-8 weeks, the eyes and face are formed, and the heartbeat can be seen on ultrasound. At the end of 12 weeks, all the baby's organs are formed.  Now that you are pregnant, you will want to do everything you can to have a healthy baby. Two of the most important things are to get good prenatal care and to follow your health care provider's instructions. Prenatal care is all the medical care you receive before the baby's birth. This care will help prevent, find, and treat any problems during the pregnancy and childbirth. BODY CHANGES Your body goes through many changes during pregnancy. The changes vary from woman to woman.   You may gain or lose a couple of pounds at first.  You may feel sick to your stomach (nauseous) and throw up (vomit). If the vomiting is uncontrollable, call your health care provider.  You may tire easily.  You may develop headaches that can be relieved by medicines approved by your health care provider.  You may urinate more often. Painful urination may mean you have a bladder infection.  You may develop heartburn as a result of your pregnancy.  You may develop constipation because certain hormones are causing the muscles that push waste through your intestines to slow down.  You may develop hemorrhoids or swollen, bulging veins (varicose veins).  Your breasts may begin to grow larger and become tender. Your nipples may stick out more, and the tissue that surrounds them (areola) may become darker.  Your gums may bleed and may be sensitive to brushing and flossing.  Dark spots or blotches (chloasma,  mask of pregnancy) may develop on your face. This will likely fade after the baby is born.  Your menstrual periods will stop.  You may have a loss of appetite.  You may develop cravings for certain kinds of food.  You may have changes in your emotions from day to day, such as being excited to be pregnant or being concerned that something may go wrong with the pregnancy and baby.  You may have more vivid and strange dreams.  You may have changes in your hair. These can include thickening of your hair, rapid growth, and changes in texture. Some women also have hair loss during or after pregnancy, or hair that feels dry or thin. Your hair will most likely return to normal after your baby is born. WHAT TO EXPECT AT YOUR PRENATAL VISITS During a routine prenatal visit:  You will be weighed to make sure you and the baby are growing normally.  Your blood pressure will be taken.  Your abdomen will be measured to track your baby's growth.  The fetal heartbeat will be listened to starting around week 10 or 12 of your pregnancy.  Test results from any previous visits will be discussed. Your health care provider may ask you:  How you are feeling.  If you are feeling the baby move.  If you have had any abnormal symptoms, such as leaking fluid, bleeding, severe headaches, or abdominal cramping.  If you are using any tobacco products,   including cigarettes, chewing tobacco, and electronic cigarettes.  If you have any questions. Other tests that may be performed during your first trimester include:  Blood tests to find your blood type and to check for the presence of any previous infections. They will also be used to check for low iron levels (anemia) and Rh antibodies. Later in the pregnancy, blood tests for diabetes will be done along with other tests if problems develop.  Urine tests to check for infections, diabetes, or protein in the urine.  An ultrasound to confirm the proper growth  and development of the baby.  An amniocentesis to check for possible genetic problems.  Fetal screens for spina bifida and Down syndrome.  You may need other tests to make sure you and the baby are doing well.  HIV (human immunodeficiency virus) testing. Routine prenatal testing includes screening for HIV, unless you choose not to have this test. HOME CARE INSTRUCTIONS  Medicines  Follow your health care provider's instructions regarding medicine use. Specific medicines may be either safe or unsafe to take during pregnancy.  Take your prenatal vitamins as directed.  If you develop constipation, try taking a stool softener if your health care provider approves. Diet  Eat regular, well-balanced meals. Choose a variety of foods, such as meat or vegetable-based protein, fish, milk and low-fat dairy products, vegetables, fruits, and whole grain breads and cereals. Your health care provider will help you determine the amount of weight gain that is right for you.  Avoid raw meat and uncooked cheese. These carry germs that can cause birth defects in the baby.  Eating four or five small meals rather than three large meals a day may help relieve nausea and vomiting. If you start to feel nauseous, eating a few soda crackers can be helpful. Drinking liquids between meals instead of during meals also seems to help nausea and vomiting.  If you develop constipation, eat more high-fiber foods, such as fresh vegetables or fruit and whole grains. Drink enough fluids to keep your urine clear or pale yellow. Activity and Exercise  Exercise only as directed by your health care provider. Exercising will help you:  Control your weight.  Stay in shape.  Be prepared for labor and delivery.  Experiencing pain or cramping in the lower abdomen or low back is a good sign that you should stop exercising. Check with your health care provider before continuing normal exercises.  Try to avoid standing for long  periods of time. Move your legs often if you must stand in one place for a long time.  Avoid heavy lifting.  Wear low-heeled shoes, and practice good posture.  You may continue to have sex unless your health care provider directs you otherwise. Relief of Pain or Discomfort  Wear a good support bra for breast tenderness.   Take warm sitz baths to soothe any pain or discomfort caused by hemorrhoids. Use hemorrhoid cream if your health care provider approves.   Rest with your legs elevated if you have leg cramps or low back pain.  If you develop varicose veins in your legs, wear support hose. Elevate your feet for 15 minutes, 3-4 times a day. Limit salt in your diet. Prenatal Care  Schedule your prenatal visits by the twelfth week of pregnancy. They are usually scheduled monthly at first, then more often in the last 2 months before delivery.  Write down your questions. Take them to your prenatal visits.  Keep all your prenatal visits as directed by your   health care provider. Safety  Wear your seat belt at all times when driving.  Make a list of emergency phone numbers, including numbers for family, friends, the hospital, and police and fire departments. General Tips  Ask your health care provider for a referral to a local prenatal education class. Begin classes no later than at the beginning of month 6 of your pregnancy.  Ask for help if you have counseling or nutritional needs during pregnancy. Your health care provider can offer advice or refer you to specialists for help with various needs.  Do not use hot tubs, steam rooms, or saunas.  Do not douche or use tampons or scented sanitary pads.  Do not cross your legs for long periods of time.  Avoid cat litter boxes and soil used by cats. These carry germs that can cause birth defects in the baby and possibly loss of the fetus by miscarriage or stillbirth.  Avoid all smoking, herbs, alcohol, and medicines not prescribed by  your health care provider. Chemicals in these affect the formation and growth of the baby.  Do not use any tobacco products, including cigarettes, chewing tobacco, and electronic cigarettes. If you need help quitting, ask your health care provider. You may receive counseling support and other resources to help you quit.  Schedule a dentist appointment. At home, brush your teeth with a soft toothbrush and be gentle when you floss. SEEK MEDICAL CARE IF:   You have dizziness.  You have mild pelvic cramps, pelvic pressure, or nagging pain in the abdominal area.  You have persistent nausea, vomiting, or diarrhea.  You have a bad smelling vaginal discharge.  You have pain with urination.  You notice increased swelling in your face, hands, legs, or ankles. SEEK IMMEDIATE MEDICAL CARE IF:   You have a fever.  You are leaking fluid from your vagina.  You have spotting or bleeding from your vagina.  You have severe abdominal cramping or pain.  You have rapid weight gain or loss.  You vomit blood or material that looks like coffee grounds.  You are exposed to German measles and have never had them.  You are exposed to fifth disease or chickenpox.  You develop a severe headache.  You have shortness of breath.  You have any kind of trauma, such as from a fall or a car accident.   This information is not intended to replace advice given to you by your health care provider. Make sure you discuss any questions you have with your health care provider.   Document Released: 01/06/2001 Document Revised: 02/02/2014 Document Reviewed: 11/22/2012 Elsevier Interactive Patient Education 2016 [employer]  

## 2015-05-13 NOTE — Progress Notes (Signed)
New OB   See SmartSet  Subjective:    Regina Andrews is a G2P1001 7225w5d being seen today for her first obstetrical visit.  Her obstetrical history is significant for obesity and Hx abnormal pap, Von Willebrands Type 2.  Also has goiter and anxiety (on Zoloft)   Patient does intend to breast feed. Pregnancy history fully reviewed.  Patient reports no complaints.  Filed Vitals:   05/13/15 1007  BP: 112/75  Pulse: 82  Weight: 112.946 kg (249 lb)    HISTORY: OB History  Gravida Para Term Preterm AB SAB TAB Ectopic Multiple Living  2 1 1       1     # Outcome Date GA Lbr Len/2nd Weight Sex Delivery Anes PTL Lv  2 Current           1 Term 10/03/09 202w0d   M Vag-Spont   Y     Past Medical History  Diagnosis Date  . Cervical cancer screening 04/10/2015    Hx abn Pap 05/2014   . Family history of depression 04/10/2015    both parents   . Family history of diabetes mellitus in first degree relative 04/10/2015    mother   . Family history of heart disease 04/10/2015  . History of abdominoplasty 04/10/2015  . History of breast augmentation 04/10/2015  . History of cholecystectomy 04/10/2015  . History of goiter 04/10/2015  . History of PCOS 04/10/2015  . History of tonsillectomy 04/10/2015  . 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 Anxiety    Past Surgical History  Procedure Laterality Date  . Breast enhancement surgery    . Tummy tuck    . Cholecystectomy    . Tonsillectomy     Family History  Problem Relation Age of Onset  . Depression Mother   . Diabetes Mother   . Depression Father   . Hyperlipidemia Father   . Hypertension Father   . Cancer Paternal Aunt      Exam    Uterus:  Fundal Height: 8 cm  Pelvic Exam:    Perineum: No Hemorrhoids, Normal Perineum   Vulva: Bartholin's, Urethra, Skene's normal   Vagina:  normal mucosa, normal discharge    pH:    Cervix: multiparous appearance and no lesions   Adnexa: no mass, fullness, tenderness   Bony Pelvis: gynecoid  System: Breast:  normal appearance, no masses or tenderness   Skin: normal coloration and turgor, no rashes    Neurologic: oriented, grossly non-focal   Extremities: normal strength, tone, and muscle mass   HEENT neck supple with midline trachea   Mouth/Teeth mucous membranes moist, pharynx normal without lesions   Neck supple   Cardiovascular: regular rate and rhythm   Respiratory:  appears well, vitals normal, no respiratory distress, acyanotic, normal RR, ear and throat exam is normal, neck free of mass or lymphadenopathy, chest clear, no wheezing, crepitations, rhonchi, normal symmetric air entry   Abdomen: soft, non-tender; bowel sounds normal; no masses,  no organomegaly   Urinary: urethral meatus normal      Assessment:    Pregnancy: G2P1001 Patient Active Problem List   Diagnosis Date Noted  . Supervision of normal subsequent pregnancy 05/13/2015  . Generalized anxiety disorder 04/16/2015  . Anxiety state 04/10/2015  . Cervical cancer screening 04/10/2015  . History of PCOS 04/10/2015  . History of goiter 04/10/2015  .  Family history of depression 04/10/2015  . Family history of diabetes mellitus in first degree relative 04/10/2015  . Family history of heart disease 04/10/2015  . History of breast augmentation 04/10/2015  . History of abdominoplasty 04/10/2015  . History of cholecystectomy 04/10/2015  . History of tonsillectomy 04/10/2015        Plan:     Initial labs drawn. Prenatal vitamins. Problem list reviewed and updated. Genetic Screening discussed First Screen: ordered.  Ultrasound discussed; fetal survey: requested.  Follow up in 4 weeks. 50% of 30 min visit spent on counseling and coordination of care.   Consulted Dr Adrian Blackwater, no tests needed for Von Willebrands, may need consult Heme TSH for hx goiter Routines  reviewed   [facility] 05/13/2015

## 2015-05-13 NOTE — Progress Notes (Signed)
Von willebrand Type 2 per pt Bedside [place] shows single IUP measuring 8024w3d and FHR 176bpm

## 2015-05-14 LAB — PRENATAL PROFILE (SOLSTAS)
Antibody Screen: NEGATIVE
BASOS PCT: 0 %
Basophils Absolute: 0 cells/uL (ref 0–200)
EOS ABS: 86 {cells}/uL (ref 15–500)
Eosinophils Relative: 1 %
HEMATOCRIT: 38.8 % (ref 35.0–45.0)
HEP B S AG: NEGATIVE
HIV: NONREACTIVE
Hemoglobin: 13 g/dL (ref 11.7–15.5)
LYMPHS ABS: 1290 {cells}/uL (ref 850–3900)
LYMPHS PCT: 15 %
MCH: 31 pg (ref 27.0–33.0)
MCHC: 33.5 g/dL (ref 32.0–36.0)
MCV: 92.4 fL (ref 80.0–100.0)
MPV: 10 fL (ref 7.5–12.5)
Monocytes Absolute: 602 cells/uL (ref 200–950)
Monocytes Relative: 7 %
NEUTROS ABS: 6622 {cells}/uL (ref 1500–7800)
Neutrophils Relative %: 77 %
PLATELETS: 305 10*3/uL (ref 140–400)
RBC: 4.2 MIL/uL (ref 3.80–5.10)
RDW: 12.5 % (ref 11.0–15.0)
RH TYPE: POSITIVE
Rubella: 2.05 Index — ABNORMAL HIGH (ref <0.90)
WBC: 8.6 10*3/uL (ref 3.8–10.8)

## 2015-05-14 LAB — CYTOLOGY - PAP

## 2015-05-14 LAB — TSH: TSH: 1.09 m[IU]/L

## 2015-05-14 LAB — GC/CHLAMYDIA PROBE AMP (~~LOC~~) NOT AT ARMC
Chlamydia: NEGATIVE
Neisseria Gonorrhea: NEGATIVE

## 2015-05-15 ENCOUNTER — Ambulatory Visit (INDEPENDENT_AMBULATORY_CARE_PROVIDER_SITE_OTHER): Payer: 59 | Admitting: Osteopathic Medicine

## 2015-05-15 ENCOUNTER — Encounter: Payer: Self-pay | Admitting: Osteopathic Medicine

## 2015-05-15 VITALS — BP 122/79 | HR 86 | Ht 70.0 in | Wt 254.0 lb

## 2015-05-15 DIAGNOSIS — F411 Generalized anxiety disorder: Secondary | ICD-10-CM

## 2015-05-15 LAB — HEMOGLOBINOPATHY EVALUATION
HEMOGLOBIN OTHER: 0 %
HGB A: 97.2 % (ref 96.8–97.8)
HGB S QUANTITAION: 0 %
Hgb A2 Quant: 2.8 % (ref 2.2–3.2)
Hgb F Quant: 0 % (ref 0.0–2.0)

## 2015-05-15 LAB — CULTURE, OB URINE

## 2015-05-15 MED ORDER — SERTRALINE HCL 50 MG PO TABS: 50.0000 mg | ORAL_TABLET | Freq: Every day | ORAL | Status: DC

## 2015-05-15 NOTE — Progress Notes (Signed)
HPI: Regina Andrews is a 30 y.o. female who presents to [facility] today for chief complaint of:  Chief Complaint  Patient presents with  . Anxiety    ANXIETY . Quality: waking up feeling panic, falling feeling. She is worried about the side effects possible in pregnancy.  . Severity: not getting worse, but not much better  . Duration: many years . Modifying factors: Zoloft recently, paxil prior to pregnancy, hydroxyzine prn but she isn't taking this, she is going to counseling, has had 1 appt so far . Assoc signs/symptoms: no SI/HI   Past medical, social and family history reviewed:no changes needed .   Current Outpatient Prescriptions  Medication Sig Dispense Refill  . Ascorbic Acid (VITAMIN C) 100 MG tablet Take 100 mg by mouth daily.    . Calcium Carb-Cholecalciferol (CALCIUM + D3) 600-200 MG-UNIT TABS Take by mouth daily. Take 1 tablet daily    . Cholecalciferol (VITAMIN D3) 2000 units TABS Take by mouth daily.    [name] doxylamine, Sleep, (UNISOM) 25 MG tablet Take 25 mg by mouth at bedtime as needed.    . Ferrous Sulfate (IRON) 325 (65 Fe) MG TABS Take by mouth daily.    . folic acid (FOLVITE) 400 MCG tablet Take 400 mcg by mouth daily.    . hydrOXYzine (ATARAX/VISTARIL) 25 MG tablet Take 1-2 tablets (25-50 mg total) by mouth 3 (three) times daily as needed for anxiety (insomnia). 90 tablet 1  . Prenatal Vit-Fe Fumarate-FA (MULTIVITAMIN-PRENATAL) 27-0.8 MG TABS tablet Take 1 tablet by mouth daily at 12 noon.    . pyridoxine (B-6) 100 MG tablet Take 100 mg by mouth daily.    . sertraline (ZOLOFT) 50 MG tablet Take 1 tablet (50 mg total) by mouth daily. Start with 1/2 tablet daily (25 mg total) for first week, then increase to 1 tablet. 30 tablet 1  . Zinc 50 MG TABS Take 50 mg by mouth daily.     No current facility-administered medications for this visit.   No Known Allergies    Review of Systems: CONSTITUTIONAL: no recet  illness HEAD/EYES/EARS/NOSE/THROAT: No  headache, no vision change, CARDIAC: No  chest pain, No  pressure, No palpitations RESPIRATORY: No  cough, No  shortness of breath/wheeze PSYCHIATRIC: No  concerns with depression, (+) concerns with anxiety, No sleep problems  Exam:  BP 122/79 mmHg  Pulse 86  Ht '5\' 10"'  (1.778 m)  Wt 254 lb (115.214 kg)  BMI 36.45 kg/m2  LMP 03/13/2015 (Exact Date) Constitutional: VS see above. General Appearance: alert, well-developed, well-nourished, NAD Psychiatric: Normal judgment/insight. Anxious mood and affect. Oriented x3. No SI/HI, no thought disorder.    Results for orders placed or performed in visit on 05/13/15 (from the past 72 hour(s))  Cytology - PAP     Status: None   Collection Time: 05/13/15 12:00 AM  Result Value Ref Range   CYTOLOGY - PAP PAP RESULT   GC/Chlamydia probe amp ([place])not at [facility]     Status: None   Collection Time: 05/13/15 12:00 AM  Result Value Ref Range   Chlamydia Negative     Comment: Normal Reference Range - Negative   Neisseria gonorrhea Negative     Comment: Normal Reference Range - Negative  Prenatal Profile     Status: Abnormal   Collection Time: 05/13/15 11:06 AM  Result Value Ref Range   WBC 8.6 3.8 - 10.8 K/uL   RBC 4.20 3.80 - 5.10 MIL/uL   Hemoglobin 13.0 11.7 -  15.5 g/dL   HCT 38.8 35.0 - 45.0 %   MCV 92.4 80.0 - 100.0 fL   MCH 31.0 27.0 - 33.0 pg   MCHC 33.5 32.0 - 36.0 g/dL   RDW 12.5 11.0 - 15.0 %   Platelets 305 140 - 400 K/uL   MPV 10.0 7.5 - 12.5 fL   Neutro Abs 6622 1500 - 7800 cells/uL   Lymphs Abs 1290 850 - 3900 cells/uL   Monocytes Absolute 602 200 - 950 cells/uL   Eosinophils Absolute 86 15 - 500 cells/uL   Basophils Absolute 0 0 - 200 cells/uL   Neutrophils Relative % 77 %   Lymphocytes Relative 15 %   Monocytes Relative 7 %   Eosinophils Relative 1 %   Basophils Relative 0 %   Smear Review Criteria for review not met    Hepatitis B Surface Ag NEGATIVE NEGATIVE   HIV 1&2 Ab,  4th Generation NONREACTIVE NONREACTIVE    Comment:   HIV-1 antigen and HIV-1/HIV-2 antibodies were not detected.  There is no laboratory evidence of HIV infection.   HIV-1/2 Antibody Diff        Not indicated. HIV-1 RNA, Qual TMA          Not indicated.      RPR Ser Ql NON REAC NON REAC   Rubella 2.05 (H) <0.90 Index    Comment:                  Index          Interpretation                =====          ==============                <0.90          Not consistent with immunity                0.90-0.99      Equivocal                >=1.00         Consistent with immunity   The presence of Rubella IgG antibody suggests immunization or past or current infection with Rubella virus.    ABO Grouping O    Rh Type POS    Antibody Screen NEG NEGATIVE    Comment:   PLEASE NOTE: This information has been disclosed to you from records whose confidentiality may be protected by state law. If your state requires such protection, then the state law prohibits you from making any further disclosure of the information without the specific written consent of the person to whom it pertains, or as otherwise permitted by law. A general authorization for the release of medical or other information is NOT sufficient for this purpose.   The performance of this assay has not been clinically validated in patients less than 14 years old.   For additional information please refer to http://education.quest[employer].com/faq/FAQ106.  (This link is being provided for informational/educational purposes only.)   ** Please note change in unit of measure and reference range(s). **   Culture, OB Urine     Status: None   Collection Time: 05/13/15 11:06 AM  Result Value Ref Range   Colony Count 15,000 COLONIES/ML    Organism ID, Bacteria Multiple bacterial morphotypes present, none    Organism ID, Bacteria predominant. Suggest appropriate recollection if     Organism ID, Bacteria clinically indicated.  Hemoglobinopathy evaluation     Status: None (Preliminary result)   Collection Time: 05/13/15 11:06 AM  Result Value Ref Range   Hgb A  96.8 - 97.8 %   Hgb A2 Quant  2.2 - 3.2 %   Hgb F Quant  0.0 - 2.0 %   Hgb S Quant  0.0 %   Hemoglobin Other  0.0 %  TSH     Status: None   Collection Time: 05/13/15 12:25 PM  Result Value Ref Range   TSH 1.09 mIU/L    Comment:   Reference Range   > or = 20 Years  0.40-4.50   Pregnancy Range First trimester  0.26-2.66 Second trimester 0.55-2.73 Third trimester  0.43-2.91         ASSESSMENT/PLAN: Would continue Zoloft to allow 6 weeksof therapy and hpoefully will note improvement/stabilization, at that point we can think abotu switching Rx or increasing dose, use Hydroxyzine in the meantime as bridge, continue counseling. If she is really worried about the risks of the medicine, which were reviewed to her as small but not nonexistent and more concerning in 3rd TM use, she can stop medications or have a discussion with her OBGYN for second opinion - this is her decision. She opts to conitnue and recheck in office here. ER/RTC precautions reviewed.   Generalized anxiety disorder - Plan: sertraline (ZOLOFT) 50 MG tablet   Return in about 4 weeks (around 06/11/2015), or soonerif needed, for ANXIETY FOLLOWUP.  Total time spent 25 minutes, greater than 50% of the visit was face-to-face counseling and coordinating care for diagnosis of anxiety and medication management.

## 2015-05-21 ENCOUNTER — Ambulatory Visit: Payer: 59 | Admitting: Osteopathic Medicine

## 2015-05-23 ENCOUNTER — Ambulatory Visit (INDEPENDENT_AMBULATORY_CARE_PROVIDER_SITE_OTHER): Payer: 59 | Admitting: Licensed Clinical Social Worker

## 2015-05-23 DIAGNOSIS — F410 Panic disorder [episodic paroxysmal anxiety] without agoraphobia: Secondary | ICD-10-CM

## 2015-05-28 ENCOUNTER — Encounter (HOSPITAL_COMMUNITY): Payer: Self-pay | Admitting: Advanced Practice Midwife

## 2015-06-06 ENCOUNTER — Ambulatory Visit (INDEPENDENT_AMBULATORY_CARE_PROVIDER_SITE_OTHER): Payer: 59 | Admitting: Licensed Clinical Social Worker

## 2015-06-06 DIAGNOSIS — F410 Panic disorder [episodic paroxysmal anxiety] without agoraphobia: Secondary | ICD-10-CM | POA: Diagnosis not present

## 2015-06-10 ENCOUNTER — Encounter: Payer: Self-pay | Admitting: Advanced Practice Midwife

## 2015-06-11 ENCOUNTER — Encounter (HOSPITAL_COMMUNITY): Payer: Self-pay

## 2015-06-11 ENCOUNTER — Ambulatory Visit (HOSPITAL_COMMUNITY)
Admission: RE | Admit: 2015-06-11 | Discharge: 2015-06-11 | Disposition: A | Discharge status: Home / Self Care | Payer: 59 | Source: Ambulatory Visit | Attending: Family Medicine | Admitting: Family Medicine

## 2015-06-11 ENCOUNTER — Encounter: Payer: Self-pay | Admitting: Osteopathic Medicine

## 2015-06-11 ENCOUNTER — Ambulatory Visit (INDEPENDENT_AMBULATORY_CARE_PROVIDER_SITE_OTHER): Payer: 59 | Admitting: Osteopathic Medicine

## 2015-06-11 ENCOUNTER — Other Ambulatory Visit: Payer: Self-pay | Admitting: Advanced Practice Midwife

## 2015-06-11 VITALS — BP 116/81 | HR 90 | Ht 70.0 in | Wt 255.0 lb

## 2015-06-11 DIAGNOSIS — Z369 Encounter for antenatal screening, unspecified: Secondary | ICD-10-CM

## 2015-06-11 DIAGNOSIS — R110 Nausea: Secondary | ICD-10-CM

## 2015-06-11 DIAGNOSIS — F411 Generalized anxiety disorder: Secondary | ICD-10-CM | POA: Diagnosis not present

## 2015-06-11 DIAGNOSIS — Z3A12 12 weeks gestation of pregnancy: Secondary | ICD-10-CM

## 2015-06-11 DIAGNOSIS — O99211 Obesity complicating pregnancy, first trimester: Secondary | ICD-10-CM | POA: Insufficient documentation

## 2015-06-11 DIAGNOSIS — F321 Major depressive disorder, single episode, moderate: Secondary | ICD-10-CM | POA: Diagnosis not present

## 2015-06-11 DIAGNOSIS — Z36 Encounter for antenatal screening of mother: Secondary | ICD-10-CM | POA: Insufficient documentation

## 2015-06-11 DIAGNOSIS — R55 Syncope and collapse: Secondary | ICD-10-CM

## 2015-06-11 DIAGNOSIS — Z3491 Encounter for supervision of normal pregnancy, unspecified, first trimester: Secondary | ICD-10-CM

## 2015-06-11 MED ORDER — ONDANSETRON 8 MG PO TBDP: 8.0000 mg | ORAL_TABLET | Freq: Three times a day (TID) | ORAL | Status: DC | PRN

## 2015-06-11 NOTE — Progress Notes (Signed)
HPI: Regina Andrews is a 30 y.o. female who presents to [facility] today for chief complaint of:  No chief complaint on file.   ANXIETY . Quality: anxiety overall better but still present  . Duration: many years . Modifying factors: At last visit, opted to continue Zoloft to allow 6 weeks of therapy to hopefully note improvement/stabilization, feels like sometimes it's helping, able to leave the house more often, using Hydroxyzine in the meantime as bridge, continuing counseling, has had 3 appt so far.  . Assoc signs/symptoms: no SI/HI  DENTAL WORK RECENTLY - given hydrocodone/acetaminophen and penicillin, asks if these are okay to take with pregnancy. Dentist did notice she was  'PASSED OUT'  . Quality: Lightheaded, no fall . Timing: See below . Context: Has history of similar vasovagal reactions, particularly with blood work. This passing out occurrence happened once at the dentist office, happened once yesterday when home from dentist office - felt like was going to vomit and when she bent down she felt like she was going to pass out again so ran to bedroom and lied down  . Assoc signs/symptoms: No head trauma, no fall  Past medical, social and family history reviewed:no changes needed .   Current Outpatient Prescriptions  Medication Sig Dispense Refill  . Ascorbic Acid (VITAMIN C) 100 MG tablet Take 100 mg by mouth daily.    . Calcium Carb-Cholecalciferol (CALCIUM + D3) 600-200 MG-UNIT TABS Take by mouth daily. Take 1 tablet daily    . Cholecalciferol (VITAMIN D3) 2000 units TABS Take by mouth daily.    [name] doxylamine, Sleep, (UNISOM) 25 MG tablet Take 25 mg by mouth at bedtime as needed.    . Ferrous Sulfate (IRON) 325 (65 Fe) MG TABS Take by mouth daily.    . folic acid (FOLVITE) 800 MCG tablet Take 400 mcg by mouth daily.    . hydrOXYzine (ATARAX/VISTARIL) 25 MG tablet Take 1-2 tablets (25-50 mg total) by mouth 3 (three) times daily as needed for  anxiety (insomnia). (Patient not taking: Reported on 05/15/2015) 90 tablet 1  . Prenatal Vit-Fe Fumarate-FA (MULTIVITAMIN-PRENATAL) 27-0.8 MG TABS tablet Take 1 tablet by mouth daily at 12 noon.    . pyridoxine (B-6) 100 MG tablet Take 100 mg by mouth daily.    . sertraline (ZOLOFT) 50 MG tablet Take 1 tablet (50 mg total) by mouth daily. 30 tablet 1  . Zinc 50 MG TABS Take 50 mg by mouth daily.     No current facility-administered medications for this visit.   No Known Allergies    Review of Systems: CONSTITUTIONAL: no recet illness HEAD/EYES/EARS/NOSE/THROAT: No  headache, no vision change, CARDIAC: No  chest pain, No  pressure, No palpitations RESPIRATORY: No  cough, No  shortness of breath/wheeze PSYCHIATRIC: No  concerns with depression, (+) concerns with anxiety, No sleep problems  Exam:  BP 116/81 mmHg  Pulse 90  Ht  (1.778 m)  Wt 255 lb (115.667 kg)  BMI 36.59 kg/m2  LMP 03/13/2015 (Exact Date) Constitutional: VS see above. General Appearance: alert, well-developed, well-nourished, NAD Psychiatric: Normal judgment/insight. Anxious mood and affect. Oriented x3. No SI/HI, no thought disorder.    No results found for this or any previous visit (from the past 72 hour(s)).    ASSESSMENT/PLAN:   If she is really worried about the risks of the medicine, which were reviewed to her as small but not nonexistent and more concerning in 3rd TM use, she can stop medications or  have a discussion with her OBGYN for second opinion - this is her decision. She is opting to stay on current dose of Zoloft, advised we can consider increasing or she can talk to her OB more about this. Would recommend continued counseling. Regarding her "passing out" episodes yesterday, sounds like vasovagal reaction, patient declines further blood work or work up, she just wanted to let me know as an Financial plannerYI. I advised that we could at least get a blood count and EKG to make sure everything is good there but she  declines. ER/RTC precautions reviewed.   Major depressive disorder, single episode, moderate (HCC)  Generalized anxiety disorder  Nausea without vomiting - Plan: ondansetron (ZOFRAN-ODT) 8 MG disintegrating tablet  Vasovagal near syncope   Return in about 8 weeks (around 08/06/2015) for ANXIETY FOLLOWUP.  Total time spent 25 minutes, greater than 50% of the visit was face-to-face counseling and coordinating care for diagnosis of anxiety and medication management.

## 2015-06-14 ENCOUNTER — Ambulatory Visit (INDEPENDENT_AMBULATORY_CARE_PROVIDER_SITE_OTHER): Payer: 59 | Admitting: Family

## 2015-06-14 VITALS — BP 113/78 | HR 88 | Wt 256.0 lb

## 2015-06-14 DIAGNOSIS — Z3481 Encounter for supervision of other normal pregnancy, first trimester: Secondary | ICD-10-CM

## 2015-06-14 DIAGNOSIS — R55 Syncope and collapse: Secondary | ICD-10-CM

## 2015-06-14 NOTE — Progress Notes (Signed)
Subjective:  Regina Andrews is a 30 y.o. G2P1001 at 3479w2d being seen today for ongoing prenatal care.  She is currently monitored for the following issues for this high-risk pregnancy and has Anxiety state; Cervical cancer screening; History of PCOS; History of goiter; Family history of depression; Family history of diabetes mellitus in first degree relative; Family history of heart disease; History of breast augmentation; History of abdominoplasty; History of cholecystectomy; History of tonsillectomy; Generalized anxiety disorder; Supervision of normal subsequent pregnancy; and Vasovagal near syncope on her problem list.  Patient reports increased concern regarding weight gain.  Ganied 60 lbs with son.  Contractions: Not present. Vag. Bleeding: None.  Movement: Absent. Denies leaking of fluid.   The following portions of the patient's history were reviewed and updated as appropriate: allergies, current medications, past family history, past medical history, past social history, past surgical history and problem list. Problem list updated.  Objective:   Filed Vitals:   06/14/15 1009  BP: 113/78  Pulse: 88  Weight: 256 lb (116.121 kg)    Fetal Status: Fetal Heart Rate (bpm): 165 Fundal Height: 14 cm Movement: Absent     General:  Alert, oriented and cooperative. Patient is in no acute distress.  Skin: Skin is warm and dry. No rash noted.   Cardiovascular: Normal heart rate noted  Respiratory: Normal respiratory effort, no problems with respiration noted  Abdomen: Soft, gravid, appropriate for gestational age. Pain/Pressure: Present     Pelvic: Vag. Bleeding: None Vag D/C Character: Thin   Cervical exam deferred        Extremities: Normal range of motion.  Edema: None  Mental Status: Normal mood and affect. Normal behavior. Normal judgment and thought content.   Urinalysis: Urine Protein: Negative Urine Glucose: Negative  Assessment and Plan:  Pregnancy: G2P1001 at 3179w2d  1. Encounter  for supervision of other normal pregnancy in first trimester - Discussed normal weight gain for her starting BMI - Reviewed nutrition strategies - AFP next visit - Reviewed NT results  General obstetric precautions including but not limited to vaginal bleeding and pelvic pain reviewed in detail with the patient. Please refer to After Visit Summary for other counseling recommendations.  Return in about 4 weeks (around 07/12/2015).   Eino FarberWalidah Kennith GainN Karim, CNM

## 2015-06-17 ENCOUNTER — Encounter: Payer: Self-pay | Admitting: *Deleted

## 2015-06-18 ENCOUNTER — Other Ambulatory Visit (HOSPITAL_COMMUNITY): Payer: Self-pay

## 2015-06-20 ENCOUNTER — Encounter: Payer: Self-pay | Admitting: Osteopathic Medicine

## 2015-06-20 ENCOUNTER — Ambulatory Visit (INDEPENDENT_AMBULATORY_CARE_PROVIDER_SITE_OTHER): Payer: 59 | Admitting: Licensed Clinical Social Worker

## 2015-06-20 DIAGNOSIS — F410 Panic disorder [episodic paroxysmal anxiety] without agoraphobia: Secondary | ICD-10-CM

## 2015-06-20 DIAGNOSIS — Z87898 Personal history of other specified conditions: Secondary | ICD-10-CM

## 2015-06-20 DIAGNOSIS — Z8719 Personal history of other diseases of the digestive system: Secondary | ICD-10-CM | POA: Insufficient documentation

## 2015-06-20 HISTORY — DX: Personal history of other specified conditions: Z87.898

## 2015-07-02 ENCOUNTER — Other Ambulatory Visit: Payer: Self-pay | Admitting: Osteopathic Medicine

## 2015-07-02 DIAGNOSIS — F411 Generalized anxiety disorder: Secondary | ICD-10-CM

## 2015-07-02 MED ORDER — SERTRALINE HCL 50 MG PO TABS: 50.0000 mg | ORAL_TABLET | Freq: Every day | ORAL | Status: DC

## 2015-07-04 ENCOUNTER — Ambulatory Visit (INDEPENDENT_AMBULATORY_CARE_PROVIDER_SITE_OTHER): Payer: 59 | Admitting: Licensed Clinical Social Worker

## 2015-07-04 DIAGNOSIS — F410 Panic disorder [episodic paroxysmal anxiety] without agoraphobia: Secondary | ICD-10-CM

## 2015-07-12 ENCOUNTER — Ambulatory Visit (INDEPENDENT_AMBULATORY_CARE_PROVIDER_SITE_OTHER): Payer: 59

## 2015-07-12 ENCOUNTER — Ambulatory Visit (INDEPENDENT_AMBULATORY_CARE_PROVIDER_SITE_OTHER): Payer: 59 | Admitting: Family

## 2015-07-12 VITALS — BP 103/78 | HR 93 | Wt 257.0 lb

## 2015-07-12 DIAGNOSIS — Z3482 Encounter for supervision of other normal pregnancy, second trimester: Secondary | ICD-10-CM

## 2015-07-12 DIAGNOSIS — M799 Soft tissue disorder, unspecified: Secondary | ICD-10-CM

## 2015-07-12 DIAGNOSIS — Z36 Encounter for antenatal screening of mother: Secondary | ICD-10-CM | POA: Diagnosis not present

## 2015-07-12 DIAGNOSIS — D680 Von Willebrand's disease: Secondary | ICD-10-CM

## 2015-07-12 DIAGNOSIS — M7989 Other specified soft tissue disorders: Secondary | ICD-10-CM

## 2015-07-12 DIAGNOSIS — Z3492 Encounter for supervision of normal pregnancy, unspecified, second trimester: Secondary | ICD-10-CM

## 2015-07-12 DIAGNOSIS — D68020 Von Willebrand disease, type 2a: Secondary | ICD-10-CM

## 2015-07-12 DIAGNOSIS — Z9889 Other specified postprocedural states: Secondary | ICD-10-CM

## 2015-07-12 NOTE — Progress Notes (Signed)
Subjective:  Regina Andrews is a [age] y.o. G2P1001 at 6266w2d being seen today for ongoing prenatal care.  She is currently monitored for the following issues for this high-risk pregnancy and has Anxiety state; Cervical cancer screening; History of PCOS; History of goiter; Family history of depression; Family history of diabetes mellitus in first degree relative; Family history of heart disease; History of breast augmentation; History of abdominoplasty; History of cholecystectomy; History of tonsillectomy; Generalized anxiety disorder; Supervision of normal subsequent pregnancy; Vasovagal near syncope; History of dysphagia; and Von Willebrand disease, type IIa (HCC) on her problem list.  Patient reports soft tissue mass above pelvic bone, nontender. May be going to [place] towards end of pregnancy until baby 366 weeks old.   Contractions: Not present. Vag. Bleeding: None.  Movement: Absent. Denies leaking of fluid.   The following portions of the patient's history were reviewed and updated as appropriate: allergies, current medications, past family history, past medical history, past social history, past surgical history and problem list. Problem list updated.  Objective:   Filed Vitals:   07/12/15 0903  BP: 103/78  Pulse: 93  Weight: 257 lb (116.574 kg)    Fetal Status: Fetal Heart Rate (bpm): 155 Fundal Height: 18 cm Movement: Absent     General:  Alert, oriented and cooperative. Patient is in no acute distress.  Skin: Skin is warm and dry. No rash noted.   Cardiovascular: Normal heart rate noted  Respiratory: Normal respiratory effort, no problems with respiration noted  Abdomen: Soft, gravid, appropriate for gestational age. Pain/Pressure: Absent     Pelvic: Cervical exam deferred        Extremities: Normal range of motion.  Edema: None  Mental Status: Normal mood and affect. Normal behavior. Normal judgment and thought content.   Urinalysis: Urine Protein: Negative Urine Glucose:  Negative  Assessment and Plan:  Pregnancy: G2P1001 at 4666w2d  1. Normal pregnancy, second trimester - Alpha fetoprotein, maternal - US MFM OB COMP + 14 WK; Future  2. . Von Willebrand disease, type IIa (HCC) - Close observation in labor  3. Soft tissue mass - Soft tissue ultrasound ordered - US Misc Soft Tissue; Future  5. History of abdominoplasty - Recommend periodic growth ultrasound  Preterm labor symptoms and general obstetric precautions including but not limited to vaginal bleeding, contractions, leaking of fluid and fetal movement were reviewed in detail with the patient. Please refer to After Visit Summary for other counseling recommendations.  Return in about 4 weeks (around 08/09/2015).   Eino FarberWalidah Kennith GainN Karim, CNM

## 2015-07-17 LAB — ALPHA FETOPROTEIN, MATERNAL
AFP: 28.2 ng/mL
CURR GEST AGE: 17.3 wk
MoM for AFP: 1.01
Open Spina bifida: NEGATIVE

## 2015-07-24 ENCOUNTER — Ambulatory Visit (HOSPITAL_COMMUNITY)
Admission: RE | Admit: 2015-07-24 | Discharge: 2015-07-24 | Disposition: A | Discharge status: Home / Self Care | Payer: 59 | Source: Ambulatory Visit | Attending: Family | Admitting: Family

## 2015-07-24 DIAGNOSIS — O99212 Obesity complicating pregnancy, second trimester: Secondary | ICD-10-CM | POA: Insufficient documentation

## 2015-07-24 DIAGNOSIS — Z3A19 19 weeks gestation of pregnancy: Secondary | ICD-10-CM | POA: Diagnosis not present

## 2015-07-24 DIAGNOSIS — Z3492 Encounter for supervision of normal pregnancy, unspecified, second trimester: Secondary | ICD-10-CM

## 2015-07-24 DIAGNOSIS — Z3482 Encounter for supervision of other normal pregnancy, second trimester: Secondary | ICD-10-CM

## 2015-07-24 DIAGNOSIS — Z36 Encounter for antenatal screening of mother: Secondary | ICD-10-CM | POA: Diagnosis not present

## 2015-07-25 ENCOUNTER — Ambulatory Visit (INDEPENDENT_AMBULATORY_CARE_PROVIDER_SITE_OTHER): Payer: 59 | Admitting: Licensed Clinical Social Worker

## 2015-07-25 DIAGNOSIS — F410 Panic disorder [episodic paroxysmal anxiety] without agoraphobia: Secondary | ICD-10-CM

## 2015-08-06 ENCOUNTER — Ambulatory Visit (INDEPENDENT_AMBULATORY_CARE_PROVIDER_SITE_OTHER): Payer: 59 | Admitting: Osteopathic Medicine

## 2015-08-06 ENCOUNTER — Encounter: Payer: Self-pay | Admitting: Osteopathic Medicine

## 2015-08-06 VITALS — BP 104/71 | HR 87 | Ht 70.0 in | Wt 261.0 lb

## 2015-08-06 DIAGNOSIS — F411 Generalized anxiety disorder: Secondary | ICD-10-CM | POA: Diagnosis not present

## 2015-08-06 DIAGNOSIS — F321 Major depressive disorder, single episode, moderate: Secondary | ICD-10-CM | POA: Diagnosis not present

## 2015-08-06 NOTE — Assessment & Plan Note (Signed)
Has been on Zoloft and Hydroxyzine

## 2015-08-06 NOTE — Progress Notes (Signed)
HPI: Regina Andrews is a 30 y.o. Not Hispanic or Latino female  who presents to [facility] today, 08/06/2015,  for chief complaint of:  Chief Complaint  Patient presents with  . Follow-up    ANXIETY    ANXIETY/DEPRESSION . Context: pregnant and has been very nervous about taking medications but is doing well on Zoloft after sticking it out to get to a steady state. Following with OBGYN.  . Quality: anxious leaving the house and coming to the doctor.  . Severity: not as bad as it was. Zoloft 50 mg dong well.  . Modifying factors: Zoloft daily. Counseling is helping.    Past medical, surgical, social and family history reviewed: Past Medical History  Diagnosis Date  . Cervical cancer screening 04/10/2015    Hx abn Pap 05/2014   . Family history of depression 04/10/2015    both parents   . Family history of diabetes mellitus in first degree relative 04/10/2015    mother   . Family history of heart disease 04/10/2015  . History of abdominoplasty 04/10/2015  . History of breast augmentation 04/10/2015  . History of cholecystectomy 04/10/2015  . History of goiter 04/10/2015  . History of PCOS 04/10/2015  . History of tonsillectomy 04/10/2015  . 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 Anxiety    Past Surgical History  Procedure Laterality Date  . Breast enhancement surgery    . Tummy tuck    . Cholecystectomy    . Tonsillectomy     Social History  Substance Use Topics  . Smoking status: Never Smoker   . Smokeless tobacco: Not on file  . Alcohol Use: Not on file   Family History  Problem Relation Age of Onset  . Depression Mother   . Diabetes Mother   . Depression Father   . Hyperlipidemia Father   . Hypertension Father   . Cancer Paternal Aunt      Current medication list and allergy/intolerance information  reviewed:   Current Outpatient Prescriptions  Medication Sig Dispense Refill  . Ascorbic Acid (VITAMIN C) 100 MG tablet Take 100 mg by mouth daily.    . Calcium Carb-Cholecalciferol (CALCIUM + D3) 600-200 MG-UNIT TABS Take by mouth daily. Take 1 tablet daily    . chlorhexidine (PERIDEX) 0.12 % solution     . Cholecalciferol (VITAMIN D3) 2000 units TABS Take by mouth daily.    [name] doxylamine, Sleep, (UNISOM) 25 MG tablet Take 25 mg by mouth at bedtime as needed.    . Ferrous Sulfate (IRON) 325 (65 Fe) MG TABS Take by mouth daily.    . folic acid (FOLVITE) 800 MCG tablet Take 400 mcg by mouth daily.    . hydrOXYzine (ATARAX/VISTARIL) 25 MG tablet Take 1-2 tablets (25-50 mg total) by mouth 3 (three) times daily as needed for anxiety (insomnia). 90 tablet 1  . ondansetron (ZOFRAN-ODT) 8 MG disintegrating tablet Take 1 tablet (8 mg total) by mouth every 8 (eight) hours as needed for nausea. 20 tablet 3  . Prenatal Vit-Fe Fumarate-FA (MULTIVITAMIN-PRENATAL) 27-0.8 MG TABS tablet Take 1 tablet by mouth daily at 12 noon.    . pyridoxine (B-6) 100 MG tablet Take 100 mg by mouth daily.    . sertraline (ZOLOFT) 50 MG tablet Take 1 tablet (50 mg total) by mouth daily. 90 tablet 1  .  Zinc 50 MG TABS Take 50 mg by mouth daily.     No current facility-administered medications for this visit.   No Known Allergies    Review of Systems:  Constitutional:  No  fever, no chills, No recent illness  GYN: No cramping/bleeding  Psychiatric: No  concerns with depression, No  concerns with anxiety, No sleep problems, No mood problems  Exam:  LMP 03/13/2015 (Exact Date)  Constitutional: VS see above. General Appearance: alert, well-developed, well-nourished, NAD  Psychiatric: Normal judgment/insight. Normal mood and affect. Oriented x3. No SI/HI. No thought disorder.     ASSESSMENT/PLAN:   Doing well, plan to continue Zoloft through pregnancy and delivery as long as it's ok with OBGYN. Followup here as  needed / if any changes. She has 6 mos of meds, should last through December this year but planning to go to [place] in the fall, deliver there, stay for awhile after.   Generalized anxiety disorder  Major depressive disorder, single episode, moderate (HCC)     Visit summary with medication list and pertinent instructions was printed for patient to review. All questions at time of visit were answered - patient instructed to contact office with any additional concerns. ER/RTC precautions were reviewed with the patient. Follow-up plan: Return in about 6 months (around 02/06/2016), or as needed if anxiety/depression changesor gets worse, otherwise continue Zoloft, for ANXIETY FOLLOW-UP.  Note: Total time spent 15 minutes, greater than 50% of the visit was spent face-to-face counseling and coordinating care for the following: The primary encounter diagnosis was Generalized anxiety disorder. A diagnosis of Major depressive disorder, single episode, moderate (HCC) was also pertinent to this visit..[name]

## 2015-08-09 ENCOUNTER — Encounter: Payer: Self-pay | Admitting: Advanced Practice Midwife

## 2015-08-12 ENCOUNTER — Ambulatory Visit (INDEPENDENT_AMBULATORY_CARE_PROVIDER_SITE_OTHER): Payer: 59 | Admitting: Advanced Practice Midwife

## 2015-08-12 VITALS — BP 109/72 | HR 78 | Wt 259.0 lb

## 2015-08-12 DIAGNOSIS — IMO0002 Reserved for concepts with insufficient information to code with codable children: Secondary | ICD-10-CM

## 2015-08-12 DIAGNOSIS — Z3482 Encounter for supervision of other normal pregnancy, second trimester: Secondary | ICD-10-CM

## 2015-08-12 DIAGNOSIS — Z1389 Encounter for screening for other disorder: Secondary | ICD-10-CM

## 2015-08-12 DIAGNOSIS — Z3A23 23 weeks gestation of pregnancy: Secondary | ICD-10-CM

## 2015-08-12 DIAGNOSIS — Z363 Encounter for antenatal screening for malformations: Secondary | ICD-10-CM

## 2015-08-12 DIAGNOSIS — Z0489 Encounter for examination and observation for other specified reasons: Secondary | ICD-10-CM

## 2015-08-12 NOTE — Progress Notes (Addendum)
Subjective:  Regina Andrews is a 30 y.o. G2P1001 at 2128w5d being seen today for ongoing prenatal care.  She is currently monitored for the following issues for this low-risk pregnancy and has Anxiety state; Cervical cancer screening; History of PCOS; History of goiter; Family history of depression; Family history of diabetes mellitus in first degree relative; Family history of heart disease; History of breast augmentation; History of abdominoplasty; History of cholecystectomy; History of tonsillectomy; Generalized anxiety disorder; Supervision of normal subsequent pregnancy; Vasovagal near syncope; History of dysphagia; and Von Willebrand disease, type IIa (HCC) on her problem list.  Patient reports dizziness after lunch some days, worse yesterday. No near-syncope, heart palpitations, CP or SOB. Worked outside most of the day. Is having protein and complex carbs w/ each meal. Not having snacks. Very concerned abotu weight gain due to excessive weight gain w/ last pregnancy.  Contractions: Not present. Vag. Bleeding: None.  Movement: Present. Denies leaking of fluid.   The following portions of the patient's history were reviewed and updated as appropriate: allergies, current medications, past family history, past medical history, past social history, past surgical history and problem list. Problem list updated.  Objective:   Filed Vitals:   08/12/15 0830  BP: 109/72  Pulse: 78  Weight: 259 lb (117.482 kg)    Fetal Status: Fetal Heart Rate (bpm): 160 Fundal Height: 22 cm Movement: Present     General:  Alert, oriented and cooperative. Patient is in no acute distress. Anxious.   Skin: Skin is warm and dry. No rash noted.   Cardiovascular: Normal heart rate noted  Respiratory: Normal respiratory effort, no problems with respiration noted  Abdomen: Soft, gravid, appropriate for gestational age. Pain/Pressure: Absent     Pelvic:  Cervical exam deferred        Extremities: Normal range of motion.   Edema: None  Mental Status: Normal mood and affect. Normal behavior. Normal judgment and thought content.   Urinalysis: Urine Protein: Trace Urine Glucose: Negative  Assessment and Plan:  Pregnancy: G2P1001 at 4128w5d  1. Encounter for supervision of other normal pregnancy in second trimester  - US MFM OB FOLLOW UP; Future  2. Encounter for routine screening for malformation using ultrasonics  - US MFM OB FOLLOW UP; Future  3. [redacted] weeks gestation of pregnancy  - US MFM OB FOLLOW UP; Future  4. Evaluate anatomy not seen on prior sonogram  - US MFM OB FOLLOW UP; Future  Preterm labor symptoms and general obstetric precautions including but not limited to vaginal bleeding, contractions, leaking of fluid and fetal movement were reviewed in detail with the patient. Please refer to After Visit Summary for other counseling recommendations.  Recommend increasing water intake when outside or sweating a lot. Have healthy snack w/ protein in it when feeling dizzy.  Return in about 4 weeks (around 09/09/2015).   Dorathy KinsmanVirginia Bram Hottel, CNM

## 2015-08-12 NOTE — Patient Instructions (Signed)

## 2015-08-15 ENCOUNTER — Ambulatory Visit: Payer: 59 | Admitting: Licensed Clinical Social Worker

## 2015-08-26 ENCOUNTER — Encounter (HOSPITAL_COMMUNITY): Payer: Self-pay

## 2015-08-26 ENCOUNTER — Ambulatory Visit (HOSPITAL_COMMUNITY)
Admission: RE | Admit: 2015-08-26 | Discharge: 2015-08-26 | Disposition: A | Discharge status: Home / Self Care | Payer: 59 | Source: Ambulatory Visit | Attending: Advanced Practice Midwife | Admitting: Advanced Practice Midwife

## 2015-08-26 ENCOUNTER — Inpatient Hospital Stay (HOSPITAL_COMMUNITY)
Admission: AD | Admit: 2015-08-26 | Discharge: 2015-08-26 | Disposition: A | Discharge status: Home / Self Care | Payer: 59 | Source: Ambulatory Visit | Attending: Obstetrics and Gynecology | Admitting: Obstetrics and Gynecology

## 2015-08-26 DIAGNOSIS — Z3A23 23 weeks gestation of pregnancy: Secondary | ICD-10-CM | POA: Insufficient documentation

## 2015-08-26 DIAGNOSIS — O99112 Other diseases of the blood and blood-forming organs and certain disorders involving the immune mechanism complicating pregnancy, second trimester: Secondary | ICD-10-CM | POA: Insufficient documentation

## 2015-08-26 DIAGNOSIS — Z3482 Encounter for supervision of other normal pregnancy, second trimester: Secondary | ICD-10-CM

## 2015-08-26 DIAGNOSIS — Z0489 Encounter for examination and observation for other specified reasons: Secondary | ICD-10-CM

## 2015-08-26 DIAGNOSIS — O99212 Obesity complicating pregnancy, second trimester: Secondary | ICD-10-CM | POA: Insufficient documentation

## 2015-08-26 DIAGNOSIS — Z36 Encounter for antenatal screening of mother: Secondary | ICD-10-CM | POA: Insufficient documentation

## 2015-08-26 DIAGNOSIS — D680 Von Willebrand's disease: Secondary | ICD-10-CM | POA: Insufficient documentation

## 2015-08-26 DIAGNOSIS — Z1389 Encounter for screening for other disorder: Secondary | ICD-10-CM

## 2015-08-26 DIAGNOSIS — Z8249 Family history of ischemic heart disease and other diseases of the circulatory system: Secondary | ICD-10-CM | POA: Insufficient documentation

## 2015-08-26 DIAGNOSIS — R51 Headache: Secondary | ICD-10-CM | POA: Diagnosis present

## 2015-08-26 DIAGNOSIS — Z363 Encounter for antenatal screening for malformations: Secondary | ICD-10-CM

## 2015-08-26 DIAGNOSIS — D68020 Von Willebrand disease, type 2a: Secondary | ICD-10-CM

## 2015-08-26 DIAGNOSIS — G43109 Migraine with aura, not intractable, without status migrainosus: Secondary | ICD-10-CM

## 2015-08-26 DIAGNOSIS — IMO0002 Reserved for concepts with insufficient information to code with codable children: Secondary | ICD-10-CM

## 2015-08-26 LAB — URINALYSIS, ROUTINE W REFLEX MICROSCOPIC
Bilirubin Urine: NEGATIVE
GLUCOSE, UA: NEGATIVE mg/dL
Hgb urine dipstick: NEGATIVE
KETONES UR: NEGATIVE mg/dL
NITRITE: NEGATIVE
PH: 5.5 (ref 5.0–8.0)
Protein, ur: NEGATIVE mg/dL
Specific Gravity, Urine: 1.01 (ref 1.005–1.030)

## 2015-08-26 LAB — URINE MICROSCOPIC-ADD ON: RBC / HPF: NONE SEEN RBC/hpf (ref 0–5)

## 2015-08-26 MED ORDER — METOCLOPRAMIDE HCL 5 MG/ML IJ SOLN
10.0000 mg | Freq: Once | INTRAMUSCULAR | Status: AC
  Administered 2015-08-26: 10 mg via INTRAVENOUS
  Filled 2015-08-26: qty 2

## 2015-08-26 MED ORDER — LACTATED RINGERS IV BOLUS (SEPSIS)
1000.0000 mL | Freq: Once | INTRAVENOUS | Status: AC
  Administered 2015-08-26: 1000 mL via INTRAVENOUS

## 2015-08-26 MED ORDER — DIPHENHYDRAMINE HCL 50 MG/ML IJ SOLN
25.0000 mg | Freq: Once | INTRAMUSCULAR | Status: AC
  Administered 2015-08-26: 25 mg via INTRAVENOUS
  Filled 2015-08-26: qty 1

## 2015-08-26 MED ORDER — DEXAMETHASONE SODIUM PHOSPHATE 10 MG/ML IJ SOLN
10.0000 mg | Freq: Once | INTRAMUSCULAR | Status: AC
  Administered 2015-08-26: 10 mg via INTRAVENOUS
  Filled 2015-08-26: qty 1

## 2015-08-26 NOTE — MAU Provider Note (Signed)
History     CSN: [id number]  Arrival date & time 08/26/15  1125   First Provider Initiated Contact with Patient 08/26/15 1216      Chief Complaint  Patient presents with  . Headache    HPI: Regina Andrews is a 30 y.o. G2P1001 with hx of chronic headaches, vWd type IIa and anxiety who presents with one day of headache. Started yesterday around 3 pm and is described as pounding with flucuations between 5/10 to 9/10 in serverity on the left side of her head. Along with this headache, yesterday afternoon, pt experienced numbing and tingling in her right arm that was followed by blurry vision and seeing spots. This ended after 45 min. Headache has persisted throughout with no alleviating remedies. Pt has not tried any medications for this. Denies nausea/vomitting, fever, edema. Was sent this morning from anatomy US to MAU due to mom not looking well. Pt has history of migraine and used to see a neurologist, but has not had migraine managed by a specialist in over 6 years. Usually takins excedrin to prevent headaches, but has not during this pregnancy.  Pt new to area from [place]. Currently gets prenatal care in [place]. No hx of complications during pregnancy.   Past Medical History:  Diagnosis Date  . Anxiety   . Cervical cancer screening 04/10/2015   Hx abn Pap 05/2014   . 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 Family history of depression 04/10/2015   both parents   . Family history of diabetes mellitus in first degree relative 04/10/2015   mother   . Family history of heart disease 04/10/2015  . History of abdominoplasty 04/10/2015  . History of breast augmentation 04/10/2015  . History of cholecystectomy 04/10/2015  . History of goiter 04/10/2015  . History of PCOS 04/10/2015  . History of tonsillectomy 04/10/2015    Past Surgical History:  Procedure Laterality  Date  . BREAST ENHANCEMENT SURGERY    . CHOLECYSTECTOMY    . TONSILLECTOMY    . TUMMY TUCK      Family History  Problem Relation Age of Onset  . Depression Mother   . Diabetes Mother   . Depression Father   . Hyperlipidemia Father   . Hypertension Father   . Cancer Paternal Aunt     Social History  Substance Use Topics  . Smoking status: Never Smoker  . Smokeless tobacco: Never Used  . Alcohol use Not on file    OB History    Gravida Para Term Preterm AB Living   SAB TAB Ectopic Multiple Live Births           1      Review of Systems  Constitutional: Positive for fever.  Eyes: Positive for blurred vision.  Cardiovascular: Negative for leg swelling.  Gastrointestinal: Negative for abdominal pain, nausea and vomiting.  Genitourinary: Negative for dysuria and hematuria.  Neurological: Positive for dizziness, tingling, sensory change and headaches. Negative for weakness.  Psychiatric/Behavioral: The patient is nervous/anxious.     Review of Systems  Constitutional: Positive for fever.  Eyes: Positive for blurred vision.  Cardiovascular: Negative for leg swelling.  Gastrointestinal: Negative for abdominal pain, nausea and vomiting.  Genitourinary: Negative for dysuria and hematuria.  Neurological: Positive for dizziness, tingling, sensory change and headaches. Negative for weakness.  Psychiatric/Behavioral: The patient is nervous/anxious.    Allergies  Review of patient's allergies indicates no known allergies.  Home Medications    BP 121/74 (BP Location: Right Arm)   Pulse 78   Temp 98.2 F (36.8 C) (Oral)   Resp 18   LMP 03/13/2015 (Exact Date)   Physical Exam  Constitutional: She is oriented to person, place, and time. She appears well-developed and well-nourished. No distress.  HENT:  Head: Normocephalic and atraumatic.  Eyes: EOM are normal. No scleral icterus.  Pulmonary/Chest: Effort normal. No respiratory distress.  Abdominal:  There is tenderness. There is no rebound and no guarding.  Genitourinary:  Genitourinary Comments: deferred  Musculoskeletal: Normal range of motion.  Neurological: She is alert and oriented to person, place, and time. She has normal strength. She displays tremor. No cranial nerve deficit or sensory deficit. Coordination and gait normal.  Psychiatric: She has a normal mood and affect. Her behavior is normal.    MAU Course  Procedures (including critical care time)  Labs Reviewed  URINALYSIS, ROUTINE W REFLEX MICROSCOPIC (NOT AT [facility]) - Abnormal; Notable for the following:       Result Value   Color, Urine STRAW (*)    APPearance HAZY (*)    Leukocytes, UA SMALL (*)    All other components within normal limits  URINE MICROSCOPIC-ADD ON - Abnormal; Notable for the following:    Squamous Epithelial / LPF 0-5 (*)    Bacteria, UA RARE (*)    All other components within normal limits   [place] Mfm Ob Follow Up  Result Date: 08/26/2015 OBSTETRICAL ULTRASOUND: This exam was performed within a [place]. The OB US report was generated in the [facility] system, and faxed to the ordering physician.  This report is available in the [employer]. See the [facility] Obstetric US report via the Image Link.    1. Von Willebrand disease, type IIa (HCC)   2. Encounter for supervision of other normal pregnancy in second trimester       MDM  Given HPI and hx of headache, pt most likely has a migraine. Current and recent BP's rule of gHTN or pre-eclampsia at this time. Lack of persistent neurological deficits, no nausea  symptoms make a hemorraghic or ischemic brain event unlikely.  Assesement 1. Migraine  Plan Headache management with IV fluids, Reglan, benadryl, and decadron. Pt declined fioriset to take at home if symptoms reoccur. She says will reestablish care with Neurology after pregnancy.  Follow-up with prenatal Ob/Gyn as scheduled. Return to MAU is symptoms worsen or develop a  fever over 100.4 F.  Meds ordered this encounter  Medications  . lactated ringers bolus 1,000 mL  . diphenhydrAMINE (BENADRYL) injection 25 mg  . metoCLOPramide (REGLAN) injection 10 mg  . dexamethasone (DECADRON) injection 10 mg  . FOLIC ACID PO    Sig: Take 1 tablet by mouth daily. Pt takes a folic acid tablet daily, but does not know the strength.    OB FELLOW MAU ATTESTATION  I have seen and examined this patient; I agree with above documentation in the students's note.    Ernestina Penna 08/26/2015, 2:24 PM

## 2015-08-26 NOTE — MAU Note (Signed)
No visual problems or numbness today.

## 2015-08-26 NOTE — Discharge Instructions (Signed)

## 2015-08-26 NOTE — MAU Note (Signed)
Started yesterday, was texting and eyes couldn't focus on the words.  Stood up, rt arm numb.  Started feeling like was going to pass out.  Vision was distorted, seeing squiggles.  HA started yesterday- did not take anything because Tylenol doesn't ever work.  Office told her to come here.  Hx of migraines.

## 2015-08-29 ENCOUNTER — Ambulatory Visit (INDEPENDENT_AMBULATORY_CARE_PROVIDER_SITE_OTHER): Payer: 59 | Admitting: Licensed Clinical Social Worker

## 2015-08-29 DIAGNOSIS — F410 Panic disorder [episodic paroxysmal anxiety] without agoraphobia: Secondary | ICD-10-CM | POA: Diagnosis not present

## 2015-09-09 ENCOUNTER — Ambulatory Visit (INDEPENDENT_AMBULATORY_CARE_PROVIDER_SITE_OTHER): Payer: 59 | Admitting: Certified Nurse Midwife

## 2015-09-09 VITALS — BP 116/76 | HR 89 | Wt 263.0 lb

## 2015-09-09 DIAGNOSIS — O26899 Other specified pregnancy related conditions, unspecified trimester: Secondary | ICD-10-CM | POA: Insufficient documentation

## 2015-09-09 DIAGNOSIS — Z3482 Encounter for supervision of other normal pregnancy, second trimester: Secondary | ICD-10-CM

## 2015-09-09 DIAGNOSIS — O99342 Other mental disorders complicating pregnancy, second trimester: Secondary | ICD-10-CM | POA: Diagnosis not present

## 2015-09-09 DIAGNOSIS — F411 Generalized anxiety disorder: Secondary | ICD-10-CM

## 2015-09-09 DIAGNOSIS — O26892 Other specified pregnancy related conditions, second trimester: Secondary | ICD-10-CM

## 2015-09-09 DIAGNOSIS — R51 Headache: Secondary | ICD-10-CM

## 2015-09-09 DIAGNOSIS — R519 Headache, unspecified: Secondary | ICD-10-CM | POA: Insufficient documentation

## 2015-09-09 LAB — POCT URINALYSIS DIPSTICK
BILIRUBIN UA: NEGATIVE
Glucose, UA: NEGATIVE
KETONES UA: NEGATIVE
Nitrite, UA: NEGATIVE
PH UA: 7
Spec Grav, UA: 1.015
Urobilinogen, UA: 0.2

## 2015-09-09 MED ORDER — BUTALBITAL-APAP-CAFFEINE 50-325-40 MG PO TABS: 1.0000 | ORAL_TABLET | Freq: Four times a day (QID) | ORAL | 0 refills | Status: DC | PRN

## 2015-09-09 NOTE — Progress Notes (Signed)
Subjective:  Regina Andrews is a 30 y.o. G2P1001 at 146w5d being seen today for ongoing prenatal care.  She is currently monitored for the following issues for this low-risk pregnancy and has Anxiety state; Cervical cancer screening; History of PCOS; History of goiter; Family history of depression; Family history of diabetes mellitus in first degree relative; Family history of heart disease; History of breast augmentation; History of abdominoplasty; History of cholecystectomy; History of tonsillectomy; Generalized anxiety disorder; Supervision of normal subsequent pregnancy; Vasovagal near syncope; History of dysphagia; Von Willebrand disease, type IIa (HCC); and Headache in pregnancy, antepartum on her problem list.  Patient reports headache and migraines. She was seen in MAU about 2 weeks ago, given HA protocol and declined Fioricet Rx. She reports HA left temporal almost every day with little response from Tylenol. HA causes N/V at times and photosensitivity. She has hx of migraine prior to pregnancy for which she took Excedrin Migraine. She also admits to eating infrequently in order to control her weight gain. She is very concerned about the amt of weight she has gained to date. She hadn't been able to loose all of her weight from the previous pregnancy and this also concerns her.. She reports walking for exercise several times per week until the HA became a problem. She states "I'm always up and moving unless sitting I'm down to eat meals". She is also worried about whether she should vaccinate the baby, she is concerned about a link of vaccinations to autism. Her other child is vaccinated and has no problems. She reports that she has many important decisions to make and feeling pressured to make the right ones. Of note, she is planning to move back to [place] in October to deliver there and have the help of family for the first 6 weeks. Contractions: Irregular. Vag. Bleeding: None.  Movement: Present. Denies  leaking of fluid.   The following portions of the patient's history were reviewed and updated as appropriate: allergies, current medications, past family history, past medical history, past social history, past surgical history and problem list. Problem list updated.  Objective:   Vitals:   09/09/15 0905  BP: 116/76  Pulse: 89  Weight: 263 lb (119.3 kg)    Fetal Status: Fetal Heart Rate (bpm): 150 Fundal Height: 25 cm Movement: Present  Presentation: Vertex  General:  Alert, oriented and cooperative. Patient is in no acute distress.  Skin: Skin is warm and dry. No rash noted.   Cardiovascular: Normal heart rate noted  Respiratory: Normal respiratory effort, no problems with respiration noted  Abdomen: Soft, gravid, appropriate for gestational age. Pain/Pressure: Present     Pelvic: Vag. Bleeding: None Vag D/C Character: White   Cervical exam deferred        Extremities: Normal range of motion.  Edema: None  Mental Status: Normal mood and affect. Normal behavior. Normal judgment and thought content.   Urinalysis: Urine Protein: Trace Urine Glucose: Negative  Assessment and Plan:  Pregnancy: G2P1001 at 336w5d  1. Encounter for supervision of other normal pregnancy in second trimester  - POCT Urinalysis Dipstick - CULTURE, URINE COMPREHENSIVE - US MFM OB FOLLOW UP; Future- for incomplete heart views  2. Anxiety state -stable on Zoloft, she reports improvement since med started - some anxiety with weight gain, reassured that her weight gain is appropriate at this point in pregnancy, recommend smaller frequent meals and snacks (add protein), and regular cardio exercise to avoid excess gain - consider behavioral health assessment for worsening anxiety sx  3. Headache in pregnancy, antepartum, second trimester -recommend frequent meals and snacks, and increase water intake as this may help HA - Fioricet 1-2 q 6 hrs prn #20, no refill   Preterm labor symptoms and general  obstetric precautions including but not limited to vaginal bleeding, contractions, leaking of fluid and fetal movement were reviewed in detail with the patient. Please refer to After Visit Summary for other counseling recommendations.  Return in about 3 weeks (around 09/30/2015).   Donette LarryMelanie Dymir Neeson, CNM

## 2015-09-09 NOTE — Progress Notes (Signed)
Blood And Leuks on UA - will send culture Currently complains of White thick vaginal discharge. She does not wish to have vaginal exam done today.

## 2015-09-12 ENCOUNTER — Telehealth: Payer: Self-pay | Admitting: *Deleted

## 2015-09-12 ENCOUNTER — Ambulatory Visit (INDEPENDENT_AMBULATORY_CARE_PROVIDER_SITE_OTHER): Payer: 59 | Admitting: Licensed Clinical Social Worker

## 2015-09-12 DIAGNOSIS — F410 Panic disorder [episodic paroxysmal anxiety] without agoraphobia: Secondary | ICD-10-CM | POA: Diagnosis not present

## 2015-09-12 DIAGNOSIS — N390 Urinary tract infection, site not specified: Secondary | ICD-10-CM

## 2015-09-12 LAB — CULTURE, URINE COMPREHENSIVE: Colony Count: >=100000

## 2015-09-12 MED ORDER — CEPHALEXIN 500 MG PO CAPS: 500.0000 mg | ORAL_CAPSULE | Freq: Two times a day (BID) | ORAL | 0 refills | Status: DC

## 2015-09-12 NOTE — Telephone Encounter (Signed)
LM on voicemail of Urine culture results and RX for Keflex was sent to her pharmacy per [place],CNM

## 2015-09-26 ENCOUNTER — Ambulatory Visit (INDEPENDENT_AMBULATORY_CARE_PROVIDER_SITE_OTHER): Payer: 59 | Admitting: Licensed Clinical Social Worker

## 2015-09-26 DIAGNOSIS — F410 Panic disorder [episodic paroxysmal anxiety] without agoraphobia: Secondary | ICD-10-CM

## 2015-10-02 ENCOUNTER — Ambulatory Visit (INDEPENDENT_AMBULATORY_CARE_PROVIDER_SITE_OTHER): Payer: 59 | Admitting: Family Medicine

## 2015-10-02 VITALS — BP 105/67 | HR 84 | Wt 265.0 lb

## 2015-10-02 DIAGNOSIS — D68020 Von Willebrand disease, type 2a: Secondary | ICD-10-CM

## 2015-10-02 DIAGNOSIS — Z3482 Encounter for supervision of other normal pregnancy, second trimester: Secondary | ICD-10-CM

## 2015-10-02 DIAGNOSIS — Z3483 Encounter for supervision of other normal pregnancy, third trimester: Secondary | ICD-10-CM | POA: Diagnosis not present

## 2015-10-02 DIAGNOSIS — Z23 Encounter for immunization: Secondary | ICD-10-CM | POA: Diagnosis not present

## 2015-10-02 DIAGNOSIS — D680 Von Willebrand's disease: Secondary | ICD-10-CM

## 2015-10-02 DIAGNOSIS — N898 Other specified noninflammatory disorders of vagina: Secondary | ICD-10-CM

## 2015-10-02 LAB — CBC
HEMATOCRIT: 33.4 % — AB (ref 35.0–45.0)
HEMOGLOBIN: 11.4 g/dL — AB (ref 11.7–15.5)
MCH: 30.9 pg (ref 27.0–33.0)
MCHC: 34.1 g/dL (ref 32.0–36.0)
MCV: 90.5 fL (ref 80.0–100.0)
MPV: 10.1 fL (ref 7.5–12.5)
Platelets: 288 10*3/uL (ref 140–400)
RBC: 3.69 MIL/uL — AB (ref 3.80–5.10)
RDW: 13.4 % (ref 11.0–15.0)
WBC: 9.7 10*3/uL (ref 3.8–10.8)

## 2015-10-02 MED ORDER — TERCONAZOLE 0.8 % VA CREA: 1.0000 | TOPICAL_CREAM | Freq: Every day | VAGINAL | 0 refills | Status: DC

## 2015-10-02 NOTE — Progress Notes (Signed)
Poss yeast infection as she was recently on Keflex

## 2015-10-02 NOTE — Patient Instructions (Signed)
Third Trimester of Pregnancy The third trimester is from week 29 through week 42, months 7 through 9. The third trimester is a time when the fetus is growing rapidly. At the end of the ninth month, the fetus is about 20 inches in length and weighs 6-10 pounds.  BODY CHANGES Your body goes through many changes during pregnancy. The changes vary from woman to woman.   Your weight will continue to increase. You can expect to gain 25-35 pounds (11-16 kg) by the end of the pregnancy.  You may begin to get stretch marks on your hips, abdomen, and breasts.  You may urinate more often because the fetus is moving lower into your pelvis and pressing on your bladder.  You may develop or continue to have heartburn as a result of your pregnancy.  You may develop constipation because certain hormones are causing the muscles that push waste through your intestines to slow down.  You may develop hemorrhoids or swollen, bulging veins (varicose veins).  You may have pelvic pain because of the weight gain and pregnancy hormones relaxing your joints between the bones in your pelvis. Backaches may result from overexertion of the muscles supporting your posture.  You may have changes in your hair. These can include thickening of your hair, rapid growth, and changes in texture. Some women also have hair loss during or after pregnancy, or hair that feels dry or thin. Your hair will most likely return to normal after your baby is born.  Your breasts will continue to grow and be tender. A yellow discharge may leak from your breasts called colostrum.  Your belly button may stick out.  You may feel short of breath because of your expanding uterus.  You may notice the fetus "dropping," or moving lower in your abdomen.  You may have a bloody mucus discharge. This usually occurs a few days to a week before labor begins.  Your cervix becomes thin and soft (effaced) near your due date. WHAT TO EXPECT AT YOUR  PRENATAL EXAMS  You will have prenatal exams every 2 weeks until week 36. Then, you will have weekly prenatal exams. During a routine prenatal visit:  You will be weighed to make sure you and the fetus are growing normally.  Your blood pressure is taken.  Your abdomen will be measured to track your baby's growth.  The fetal heartbeat will be listened to.  Any test results from the previous visit will be discussed.  You may have a cervical check near your due date to see if you have effaced. At around 36 weeks, your caregiver will check your cervix. At the same time, your caregiver will also perform a test on the secretions of the vaginal tissue. This test is to determine if a type of bacteria, Group B streptococcus, is present. Your caregiver will explain this further. Your caregiver may ask you:  What your birth plan is.  How you are feeling.  If you are feeling the baby move.  If you have had any abnormal symptoms, such as leaking fluid, bleeding, severe headaches, or abdominal cramping.  If you are using any tobacco products, including cigarettes, chewing tobacco, and electronic cigarettes.  If you have any questions. Other tests or screenings that may be performed during your third trimester include:  Blood tests that check for low iron levels (anemia).  Fetal testing to check the health, activity level, and growth of the fetus. Testing is done if you have certain medical conditions or if   there are problems during the pregnancy.  HIV (human immunodeficiency virus) testing. If you are at high risk, you may be screened for HIV during your third trimester of pregnancy. FALSE LABOR You may feel small, irregular contractions that eventually go away. These are called Braxton Hicks contractions, or false labor. Contractions may last for hours, days, or even weeks before true labor sets in. If contractions come at regular intervals, intensify, or become painful, it is best to be seen  by your caregiver.  SIGNS OF LABOR   Menstrual-like cramps.  Contractions that are 5 minutes apart or less.  Contractions that start on the top of the uterus and spread down to the lower abdomen and back.  A sense of increased pelvic pressure or back pain.  A watery or bloody mucus discharge that comes from the vagina. If you have any of these signs before the 37th week of pregnancy, call your caregiver right away. You need to go to the hospital to get checked immediately. HOME CARE INSTRUCTIONS   Avoid all smoking, herbs, alcohol, and unprescribed drugs. These chemicals affect the formation and growth of the baby.  Do not use any tobacco products, including cigarettes, chewing tobacco, and electronic cigarettes. If you need help quitting, ask your health care provider. You may receive counseling support and other resources to help you quit.  Follow your caregiver's instructions regarding medicine use. There are medicines that are either safe or unsafe to take during pregnancy.  Exercise only as directed by your caregiver. Experiencing uterine cramps is a good sign to stop exercising.  Continue to eat regular, healthy meals.  Wear a good support bra for breast tenderness.  Do not use hot tubs, steam rooms, or saunas.  Wear your seat belt at all times when driving.  Avoid raw meat, uncooked cheese, cat litter boxes, and soil used by cats. These carry germs that can cause birth defects in the baby.  Take your prenatal vitamins.  Take 1500-2000 mg of calcium daily starting at the 20th week of pregnancy until you deliver your baby.  Try taking a stool softener (if your caregiver approves) if you develop constipation. Eat more high-fiber foods, such as fresh vegetables or fruit and whole grains. Drink plenty of fluids to keep your urine clear or pale yellow.  Take warm sitz baths to soothe any pain or discomfort caused by hemorrhoids. Use hemorrhoid cream if your caregiver  approves.  If you develop varicose veins, wear support hose. Elevate your feet for 15 minutes, 3-4 times a day. Limit salt in your diet.  Avoid heavy lifting, wear low heal shoes, and practice good posture.  Rest a lot with your legs elevated if you have leg cramps or low back pain.  Visit your dentist if you have not gone during your pregnancy. Use a soft toothbrush to brush your teeth and be gentle when you floss.  A sexual relationship may be continued unless your caregiver directs you otherwise.  Do not travel far distances unless it is absolutely necessary and only with the approval of your caregiver.  Take prenatal classes to understand, practice, and ask questions about the labor and delivery.  Make a trial run to the hospital.  Pack your hospital bag.  Prepare the baby's nursery.  Continue to go to all your prenatal visits as directed by your caregiver. SEEK MEDICAL CARE IF:  You are unsure if you are in labor or if your water has broken.  You have dizziness.  You have   mild pelvic cramps, pelvic pressure, or nagging pain in your abdominal area.  You have persistent nausea, vomiting, or diarrhea.  You have a bad smelling vaginal discharge.  You have pain with urination. SEEK IMMEDIATE MEDICAL CARE IF:   You have a fever.  You are leaking fluid from your vagina.  You have spotting or bleeding from your vagina.  You have severe abdominal cramping or pain.  You have rapid weight loss or gain.  You have shortness of breath with chest pain.  You notice sudden or extreme swelling of your face, hands, ankles, feet, or legs.  You have not felt your baby move in over an hour.  You have severe headaches that do not go away with medicine.  You have vision changes.   This information is not intended to replace advice given to you by your health care provider. Make sure you discuss any questions you have with your health care provider.   Document Released:  01/06/2001 Document Revised: 02/02/2014 Document Reviewed: 03/15/2012 [employer] Interactive Patient Education 2016 [employer]  Breastfeeding Deciding to breastfeed is one of the best choices you can make for you and your baby. A change in hormones during pregnancy causes your breast tissue to grow and increases the number and size of your milk ducts. These hormones also allow proteins, sugars, and fats from your blood supply to make breast milk in your milk-producing glands. Hormones prevent breast milk from being released before your baby is born as well as prompt milk flow after birth. Once breastfeeding has begun, thoughts of your baby, as well as his or her sucking or crying, can stimulate the release of milk from your milk-producing glands.  BENEFITS OF BREASTFEEDING For Your Baby  Your first milk (colostrum) helps your baby's digestive system function better.  There are antibodies in your milk that help your baby fight off infections.  Your baby has a lower incidence of asthma, allergies, and sudden infant death syndrome.  The nutrients in breast milk are better for your baby than infant formulas and are designed uniquely for your baby's needs.  Breast milk improves your baby's brain development.  Your baby is less likely to develop other conditions, such as childhood obesity, asthma, or type 2 diabetes mellitus. For You  Breastfeeding helps to create a very special bond between you and your baby.  Breastfeeding is convenient. Breast milk is always available at the correct temperature and costs nothing.  Breastfeeding helps to burn calories and helps you lose the weight gained during pregnancy.  Breastfeeding makes your uterus contract to its prepregnancy size faster and slows bleeding (lochia) after you give birth.   Breastfeeding helps to lower your risk of developing type 2 diabetes mellitus, osteoporosis, and breast or ovarian cancer later in life. SIGNS THAT YOUR BABY IS  HUNGRY Early Signs of Hunger  Increased alertness or activity.  Stretching.  Movement of the head from side to side.  Movement of the head and opening of the mouth when the corner of the mouth or cheek is stroked (rooting).  Increased sucking sounds, smacking lips, cooing, sighing, or squeaking.  Hand-to-mouth movements.  Increased sucking of fingers or hands. Late Signs of Hunger  Fussing.  Intermittent crying. Extreme Signs of Hunger Signs of extreme hunger will require calming and consoling before your baby will be able to breastfeed successfully. Do not wait for the following signs of extreme hunger to occur before you initiate breastfeeding:  Restlessness.  A loud, strong cry.  Screaming.   BREASTFEEDING BASICS Breastfeeding Initiation  Find a comfortable place to sit or lie down, with your neck and back well supported.  Place a pillow or rolled up blanket under your baby to bring him or her to the level of your breast (if you are seated). Nursing pillows are specially designed to help support your arms and your baby while you breastfeed.  Make sure that your baby's abdomen is facing your abdomen.  Gently massage your breast. With your fingertips, massage from your chest wall toward your nipple in a circular motion. This encourages milk flow. You may need to continue this action during the feeding if your milk flows slowly.  Support your breast with 4 fingers underneath and your thumb above your nipple. Make sure your fingers are well away from your nipple and your baby's mouth.  Stroke your baby's lips gently with your finger or nipple.  When your baby's mouth is open wide enough, quickly bring your baby to your breast, placing your entire nipple and as much of the colored area around your nipple (areola) as possible into your baby's mouth.  More areola should be visible above your baby's upper lip than below the lower lip.  Your baby's tongue should be between his  or her lower gum and your breast.  Ensure that your baby's mouth is correctly positioned around your nipple (latched). Your baby's lips should create a seal on your breast and be turned out (everted).  It is common for your baby to suck about 2-3 minutes in order to start the flow of breast milk. Latching Teaching your baby how to latch on to your breast properly is very important. An improper latch can cause nipple pain and decreased milk supply for you and poor weight gain in your baby. Also, if your baby is not latched onto your nipple properly, he or she may swallow some air during feeding. This can make your baby fussy. Burping your baby when you switch breasts during the feeding can help to get rid of the air. However, teaching your baby to latch on properly is still the best way to prevent fussiness from swallowing air while breastfeeding. Signs that your baby has successfully latched on to your nipple:  Silent tugging or silent sucking, without causing you pain.  Swallowing heard between every 3-4 sucks.  Muscle movement above and in front of his or her ears while sucking. Signs that your baby has not successfully latched on to nipple:  Sucking sounds or smacking sounds from your baby while breastfeeding.  Nipple pain. If you think your baby has not latched on correctly, slip your finger into the corner of your baby's mouth to break the suction and place it between your baby's gums. Attempt breastfeeding initiation again. Signs of Successful Breastfeeding Signs from your baby:  A gradual decrease in the number of sucks or complete cessation of sucking.  Falling asleep.  Relaxation of his or her body.  Retention of a small amount of milk in his or her mouth.  Letting go of your breast by himself or herself. Signs from you:  Breasts that have increased in firmness, weight, and size 1-3 hours after feeding.  Breasts that are softer immediately after  breastfeeding.  Increased milk volume, as well as a change in milk consistency and color by the fifth day of breastfeeding.  Nipples that are not sore, cracked, or bleeding. Signs That Your Baby is Getting Enough Milk  Wetting at least 3 diapers in a 24-hour period.   The urine should be clear and pale yellow by age 5 days.  At least 3 stools in a 24-hour period by age 5 days. The stool should be soft and yellow.  At least 3 stools in a 24-hour period by age 7 days. The stool should be seedy and yellow.  No loss of weight greater than 10% of birth weight during the first 3 days of age.  Average weight gain of 4-7 ounces (113-198 g) per week after age 4 days.  Consistent daily weight gain by age 5 days, without weight loss after the age of 2 weeks. After a feeding, your baby may spit up a small amount. This is common. BREASTFEEDING FREQUENCY AND DURATION Frequent feeding will help you make more milk and can prevent sore nipples and breast engorgement. Breastfeed when you feel the need to reduce the fullness of your breasts or when your baby shows signs of hunger. This is called "breastfeeding on demand." Avoid introducing a pacifier to your baby while you are working to establish breastfeeding (the first 4-6 weeks after your baby is born). After this time you may choose to use a pacifier. Research has shown that pacifier use during the first year of a baby's life decreases the risk of sudden infant death syndrome (SIDS). Allow your baby to feed on each breast as long as he or she wants. Breastfeed until your baby is finished feeding. When your baby unlatches or falls asleep while feeding from the first breast, offer the second breast. Because newborns are often sleepy in the first few weeks of life, you may need to awaken your baby to get him or her to feed. Breastfeeding times will vary from baby to baby. However, the following rules can serve as a guide to help you ensure that your baby is  properly fed:  Newborns (babies 4 weeks of age or younger) may breastfeed every 1-3 hours.  Newborns should not go longer than 3 hours during the day or 5 hours during the night without breastfeeding.  You should breastfeed your baby a minimum of 8 times in a 24-hour period until you begin to introduce solid foods to your baby at around 6 months of age. BREAST MILK PUMPING Pumping and storing breast milk allows you to ensure that your baby is exclusively fed your breast milk, even at times when you are unable to breastfeed. This is especially important if you are going back to work while you are still breastfeeding or when you are not able to be present during feedings. Your lactation consultant can give you guidelines on how long it is safe to store breast milk. A breast pump is a machine that allows you to pump milk from your breast into a sterile bottle. The pumped breast milk can then be stored in a refrigerator or freezer. Some breast pumps are operated by hand, while others use electricity. Ask your lactation consultant which type will work best for you. Breast pumps can be purchased, but some hospitals and breastfeeding support groups lease breast pumps on a monthly basis. A lactation consultant can teach you how to hand express breast milk, if you prefer not to use a pump. CARING FOR YOUR BREASTS WHILE YOU BREASTFEED Nipples can become dry, cracked, and sore while breastfeeding. The following recommendations can help keep your breasts moisturized and healthy:  Avoid using soap on your nipples.  Wear a supportive bra. Although not required, special nursing bras and tank tops are designed to allow access to your   breasts for breastfeeding without taking off your entire bra or top. Avoid wearing underwire-style bras or extremely tight bras.  Air dry your nipples for 3-4minutes after each feeding.  Use only cotton bra pads to absorb leaked breast milk. Leaking of breast milk between feedings  is normal.  Use lanolin on your nipples after breastfeeding. Lanolin helps to maintain your skin's normal moisture barrier. If you use pure lanolin, you do not need to wash it off before feeding your baby again. Pure lanolin is not toxic to your baby. You may also hand express a few drops of breast milk and gently massage that milk into your nipples and allow the milk to air dry. In the first few weeks after giving birth, some women experience extremely full breasts (engorgement). Engorgement can make your breasts feel heavy, warm, and tender to the touch. Engorgement peaks within 3-5 days after you give birth. The following recommendations can help ease engorgement:  Completely empty your breasts while breastfeeding or pumping. You may want to start by applying warm, moist heat (in the shower or with warm water-soaked hand towels) just before feeding or pumping. This increases circulation and helps the milk flow. If your baby does not completely empty your breasts while breastfeeding, pump any extra milk after he or she is finished.  Wear a snug bra (nursing or regular) or tank top for 1-2 days to signal your body to slightly decrease milk production.  Apply ice packs to your breasts, unless this is too uncomfortable for you.  Make sure that your baby is latched on and positioned properly while breastfeeding. If engorgement persists after 48 hours of following these recommendations, contact your health care provider or a lactation consultant. OVERALL HEALTH CARE RECOMMENDATIONS WHILE BREASTFEEDING  Eat healthy foods. Alternate between meals and snacks, eating 3 of each per day. Because what you eat affects your breast milk, some of the foods may make your baby more irritable than usual. Avoid eating these foods if you are sure that they are negatively affecting your baby.  Drink milk, fruit juice, and water to satisfy your thirst (about 10 glasses a day).  Rest often, relax, and continue to take  your prenatal vitamins to prevent fatigue, stress, and anemia.  Continue breast self-awareness checks.  Avoid chewing and smoking tobacco. Chemicals from cigarettes that pass into breast milk and exposure to secondhand smoke may harm your baby.  Avoid alcohol and drug use, including marijuana. Some medicines that may be harmful to your baby can pass through breast milk. It is important to ask your health care provider before taking any medicine, including all over-the-counter and prescription medicine as well as vitamin and herbal supplements. It is possible to become pregnant while breastfeeding. If birth control is desired, ask your health care provider about options that will be safe for your baby. SEEK MEDICAL CARE IF:  You feel like you want to stop breastfeeding or have become frustrated with breastfeeding.  You have painful breasts or nipples.  Your nipples are cracked or bleeding.  Your breasts are red, tender, or warm.  You have a swollen area on either breast.  You have a fever or chills.  You have nausea or vomiting.  You have drainage other than breast milk from your nipples.  Your breasts do not become full before feedings by the fifth day after you give birth.  You feel sad and depressed.  Your baby is too sleepy to eat well.  Your baby is having trouble sleeping.     Your baby is wetting less than 3 diapers in a 24-hour period.  Your baby has less than 3 stools in a 24-hour period.  Your baby's skin or the white part of his or her eyes becomes yellow.   Your baby is not gaining weight by 5 days of age. SEEK IMMEDIATE MEDICAL CARE IF:  Your baby is overly tired (lethargic) and does not want to wake up and feed.  Your baby develops an unexplained fever.   This information is not intended to replace advice given to you by your health care provider. Make sure you discuss any questions you have with your health care provider.   Document Released: 01/12/2005  Document Revised: 10/03/2014 Document Reviewed: 07/06/2012 [employer] Interactive Patient Education 2016 [employer]  

## 2015-10-02 NOTE — Progress Notes (Signed)
   PRENATAL VISIT NOTE  Subjective:  Regina Andrews is a 30 y.o. G2P1001 at 3118w0d being seen today for ongoing prenatal care.  She is currently monitored for the following issues for this high-risk pregnancy and has Anxiety state; Cervical cancer screening; History of PCOS; History of goiter; Family history of depression; Family history of diabetes mellitus in first degree relative; Family history of heart disease; History of breast augmentation; History of abdominoplasty; History of cholecystectomy; History of tonsillectomy; Generalized anxiety disorder; Supervision of normal subsequent pregnancy; Vasovagal near syncope; History of dysphagia; Von Willebrand disease, type IIa (HCC); and Headache in pregnancy, antepartum on her problem list.  Patient reports backache, no bleeding, no contractions, no cramping, no leaking and vaginal irritation.  Contractions: Not present. Vag. Bleeding: None.  Movement: (!) Decreased. Denies leaking of fluid.   The following portions of the patient's history were reviewed and updated as appropriate: allergies, current medications, past family history, past medical history, past social history, past surgical history and problem list. Problem list updated.  Objective:   Vitals:   10/02/15 0904  BP: 105/67  Pulse: 84  Weight: 265 lb (120.2 kg)    Fetal Status: Fetal Heart Rate (bpm): 147 Fundal Height: 29 cm Movement: (!) Decreased     General:  Alert, oriented and cooperative. Patient is in no acute distress.  Skin: Skin is warm and dry. No rash noted.   Cardiovascular: Normal heart rate noted  Respiratory: Normal respiratory effort, no problems with respiration noted  Abdomen: Soft, gravid, appropriate for gestational age. Pain/Pressure: Present     Pelvic:  Cervical exam performed Dilation: Closed Effacement (%): Thick Station: Ballotable  Extremities: Normal range of motion.  Edema: None  Mental Status: Normal mood and affect. Normal behavior. Normal  judgment and thought content.   Urinalysis: Urine Protein: Trace Urine Glucose: Negative  Assessment and Plan:  Pregnancy: G2P1001 at 7118w0d  1. Normal pregnancy in multigravida in second trimester - Tdap vaccine greater than or equal to 7yo IM - patient still considering this Reports decreased FM--wants Fluid check-bedside u/s reveals normal movement and normal fluid Discomforts of pregnancy--recommend maternity belt.  2. Encounter for supervision of other normal pregnancy in third trimester For f/u anatomy tomorrow - CBC - RPR - Glucose Tolerance, 1 HR (50g) - HIV antibody  3. Von Willebrand disease, type IIa (HCC) Have sent a message to Dr. Rochele PagesGranfortuna--suspect she would need DDAVP with delivery  4. Vaginal discharge Treat presumptively with Terazol given h/o abx use and vaginal irritation - Wet prep, genital - terconazole (TERAZOL 3) 0.8 % vaginal cream; Place 1 applicator vaginally at bedtime.  Dispense: 20 g; Refill: 0  Preterm labor symptoms and general obstetric precautions including but not limited to vaginal bleeding, contractions, leaking of fluid and fetal movement were reviewed in detail with the patient. Please refer to After Visit Summary for other counseling recommendations.  Return in 2 weeks (on 10/16/2015).  Reva Boresanya S Inola Lisle, MD

## 2015-10-03 ENCOUNTER — Other Ambulatory Visit: Payer: Self-pay | Admitting: Certified Nurse Midwife

## 2015-10-03 ENCOUNTER — Ambulatory Visit (HOSPITAL_COMMUNITY)
Admission: RE | Admit: 2015-10-03 | Discharge: 2015-10-03 | Disposition: A | Discharge status: Home / Self Care | Payer: 59 | Source: Ambulatory Visit | Attending: Certified Nurse Midwife | Admitting: Certified Nurse Midwife

## 2015-10-03 ENCOUNTER — Ambulatory Visit: Payer: 59 | Admitting: Licensed Clinical Social Worker

## 2015-10-03 DIAGNOSIS — Z0489 Encounter for examination and observation for other specified reasons: Secondary | ICD-10-CM

## 2015-10-03 DIAGNOSIS — O99213 Obesity complicating pregnancy, third trimester: Secondary | ICD-10-CM

## 2015-10-03 DIAGNOSIS — Z36 Encounter for antenatal screening of mother: Secondary | ICD-10-CM | POA: Insufficient documentation

## 2015-10-03 DIAGNOSIS — Z3A29 29 weeks gestation of pregnancy: Secondary | ICD-10-CM

## 2015-10-03 DIAGNOSIS — IMO0002 Reserved for concepts with insufficient information to code with codable children: Secondary | ICD-10-CM

## 2015-10-03 DIAGNOSIS — E669 Obesity, unspecified: Secondary | ICD-10-CM | POA: Insufficient documentation

## 2015-10-03 DIAGNOSIS — Z3482 Encounter for supervision of other normal pregnancy, second trimester: Secondary | ICD-10-CM

## 2015-10-03 LAB — WET PREP, GENITAL
TRICH WET PREP: NONE SEEN
YEAST WET PREP: NONE SEEN

## 2015-10-03 LAB — HIV ANTIBODY (ROUTINE TESTING W REFLEX): HIV: NONREACTIVE

## 2015-10-03 LAB — GLUCOSE TOLERANCE, 1 HOUR (50G) W/O FASTING: GLUCOSE, 1 HR, GESTATIONAL: 92 mg/dL (ref <140)

## 2015-10-04 LAB — RPR

## 2015-10-14 ENCOUNTER — Other Ambulatory Visit: Payer: Self-pay | Admitting: Osteopathic Medicine

## 2015-10-14 DIAGNOSIS — F411 Generalized anxiety disorder: Secondary | ICD-10-CM

## 2015-10-18 ENCOUNTER — Telehealth: Payer: Self-pay | Admitting: Osteopathic Medicine

## 2015-10-18 ENCOUNTER — Encounter: Payer: Self-pay | Admitting: Osteopathic Medicine

## 2015-10-18 ENCOUNTER — Ambulatory Visit (INDEPENDENT_AMBULATORY_CARE_PROVIDER_SITE_OTHER): Payer: 59 | Admitting: Obstetrics and Gynecology

## 2015-10-18 ENCOUNTER — Encounter: Payer: Self-pay | Admitting: Obstetrics and Gynecology

## 2015-10-18 VITALS — BP 102/73 | HR 79 | Wt 262.0 lb

## 2015-10-18 DIAGNOSIS — O26893 Other specified pregnancy related conditions, third trimester: Secondary | ICD-10-CM

## 2015-10-18 DIAGNOSIS — O2693 Pregnancy related conditions, unspecified, third trimester: Secondary | ICD-10-CM

## 2015-10-18 DIAGNOSIS — Z3483 Encounter for supervision of other normal pregnancy, third trimester: Secondary | ICD-10-CM

## 2015-10-18 DIAGNOSIS — M545 Low back pain: Secondary | ICD-10-CM

## 2015-10-18 DIAGNOSIS — O99343 Other mental disorders complicating pregnancy, third trimester: Secondary | ICD-10-CM

## 2015-10-18 DIAGNOSIS — F411 Generalized anxiety disorder: Secondary | ICD-10-CM

## 2015-10-18 NOTE — Telephone Encounter (Signed)
Pt came in and was wanting to know if you can write a letter for her to give to the airport stating that she has bad anxiety and would need for her husband to be allowed to walk her to the gate since he is not flying with her. The airlines didn't say what the letter needed but told her she would benefit from having one. Thanks

## 2015-10-18 NOTE — Progress Notes (Signed)
   PRENATAL VISIT NOTE  Subjective:  Regina Andrews is a 30 y.o. G2P1001 at 2318w2d being seen today for ongoing prenatal care.  She is currently monitored for the following issues for this low-risk pregnancy and has Anxiety state; Cervical cancer screening; History of PCOS; History of goiter; Family history of depression; Family history of diabetes mellitus in first degree relative; Family history of heart disease; History of breast augmentation; History of abdominoplasty; History of cholecystectomy; History of tonsillectomy; Generalized anxiety disorder; Supervision of normal subsequent pregnancy; Vasovagal near syncope; History of dysphagia; Von Willebrand disease, type IIa (HCC); and Headache in pregnancy, antepartum on her problem list.  Patient reports backache and anxiety. Fell over onto upper back yesterday while cleaning tub. Struck back and head, no bruising or laceration. Still sore upper back; transient H/AContractions: Not present. Vag. Bleeding: None.  Movement: Present. Denies leaking of fluid. 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   The following portions of the patient's history were reviewed and updated as appropriate: allergies, current medications, past family history, past medical history, past social history, past surgical history and problem list. Problem list updated.  Objective:   Vitals:   10/18/15 0917  BP: 102/73  Pulse: 79  Weight: 262 lb (118.8 kg)    Fetal Status: Fetal Heart Rate (bpm): 145   Movement: Present     General:  Alert, oriented and cooperative. Patient is in no acute distress.  Skin: Skin is warm and dry. No rash noted.   Cardiovascular: Normal heart rate noted  Respiratory: Normal respiratory effort, no problems with respiration noted  Abdomen: Soft, gravid, appropriate for gestational age. Pain/Pressure: Present     Pelvic:  Cervical exam deferred        Extremities: Normal range of motion.   Edema: None  Mental Status: Normal mood and affect. Normal behavior. Normal judgment and thought content.   Urinalysis: Urine Protein: Negative Urine Glucose: Negative  Assessment and Plan:  Pregnancy: G2P1001 at 3118w2d  1. Encounter for supervision of other normal pregnancy in third trimester Plans flight to [place] in 2 wks and will deliver there  2. Anxiety state Situational anxiety discussed at length. Not having panic attacks; on Zoloft.  3. Low back pain during pregnancy, third trimester Relief measures discussed. Also upper back discomfort after sliding onto back while cleaning tub yesterday.   Preterm labor symptoms and general obstetric precautions including but not limited to vaginal bleeding, contractions, leaking of fluid and fetal movement were reviewed in detail with the patient. Please refer to After Visit Summary for other counseling recommendations.  Return in about 22 weeks (around 03/20/2016).  Danae Orleanseirdre C Shaddai Shapley, CNM

## 2015-10-18 NOTE — Telephone Encounter (Signed)
Letter printed and left up front for pt to pick up 

## 2015-10-18 NOTE — Patient Instructions (Signed)
Back Pain in Pregnancy Back pain during pregnancy is common. It happens in about half of all pregnancies. It is important for you and your baby that you remain active during your pregnancy.If you feel that back pain is not allowing you to remain active or sleep well, it is time to see your caregiver. Back pain may be caused by several factors related to changes during your pregnancy.Fortunately, unless you had trouble with your back before your pregnancy, the pain is likely to get better after you deliver. Low back pain usually occurs between the fifth and seventh months of pregnancy. It can, however, happen in the first couple months. Factors that increase the risk of back problems include:   Previous back problems.  Injury to your back.  Having twins or multiple births.  A chronic cough.  Stress.  Job-related repetitive motions.  Muscle or spinal disease in the back.  Family history of back problems, ruptured (herniated) discs, or osteoporosis.  Depression, anxiety, and panic attacks. CAUSES   When you are pregnant, your body produces a hormone called relaxin. This hormonemakes the ligaments connecting the low back and pubic bones more flexible. This flexibility allows the baby to be delivered more easily. When your ligaments are loose, your muscles need to work harder to support your back. Soreness in your back can come from tired muscles. Soreness can also come from back tissues that are irritated since they are receiving less support.  As the baby grows, it puts pressure on the nerves and blood vessels in your pelvis. This can cause back pain.  As the baby grows and gets heavier during pregnancy, the uterus pushes the stomach muscles forward and changes your center of gravity. This makes your back muscles work harder to maintain good posture. SYMPTOMS  Lumbar pain during pregnancy Lumbar pain during pregnancy usually occurs at or above the waist in the center of the back. There  may be pain and numbness that radiates into your leg or foot. This is similar to low back pain experienced by non-pregnant women. It usually increases with sitting for long periods of time, standing, or repetitive lifting. Tenderness may also be present in the muscles along your upper back. Posterior pelvic pain during pregnancy Pain in the back of the pelvis is more common than lumbar pain in pregnancy. It is a deep pain felt in your side at the waistline, or across the tailbone (sacrum), or in both places. You may have pain on one or both sides. This pain can also go into the buttocks and backs of the upper thighs. Pubic and groin pain may also be present. The pain does not quickly resolve with rest, and morning stiffness may also be present. Pelvic pain during pregnancy can be brought on by most activities. A high level of fitness before and during pregnancy may or may not prevent this problem. Labor pain is usually 1 to 2 minutes apart, lasts for about 1 minute, and involves a bearing down feeling or pressure in your pelvis. However, if you are at term with the pregnancy, constant low back pain can be the beginning of early labor, and you should be aware of this. DIAGNOSIS  X-rays of the back should not be done during the first 12 to 14 weeks of the pregnancy and only when absolutely necessary during the rest of the pregnancy. MRIs do not give off radiation and are safe during pregnancy. MRIs also should only be done when absolutely necessary. HOME CARE INSTRUCTIONS  Exercise   as directed by your caregiver. Exercise is the most effective way to prevent or manage back pain. If you have a back problem, it is especially important to avoid sports that require sudden body movements. Swimming and walking are great activities.  Do not stand in one place for long periods of time.  Do not wear high heels.  Sit in chairs with good posture. Use a pillow on your lower back if necessary. Make sure your head  rests over your shoulders and is not hanging forward.  Try sleeping on your side, preferably the left side, with a pillow or two between your legs. If you are sore after a night's rest, your bedmay betoo soft.Try placing a board between your mattress and box spring.  Listen to your body when lifting.If you are experiencing pain, ask for help or try bending yourknees more so you can use your leg muscles rather than your back muscles. Squat down when picking up something from the floor. Do not bend over.  Eat a healthy diet. Try to gain weight within your caregiver's recommendations.  Use heat or cold packs 3 to 4 times a day for 15 minutes to help with the pain.  Only take over-the-counter or prescription medicines for pain, discomfort, or fever as directed by your caregiver. Sudden (acute) back pain  Use bed rest for only the most extreme, acute episodes of back pain. Prolonged bed rest over 48 hours will aggravate your condition.  Ice is very effective for acute conditions.  Put ice in a plastic bag.  Place a towel between your skin and the bag.  Leave the ice on for 10 to 20 minutes every 2 hours, or as needed.  Using heat packs for 30 minutes prior to activities is also helpful. Continued back pain See your caregiver if you have continued problems. Your caregiver can help or refer you for appropriate physical therapy. With conditioning, most back problems can be avoided. Sometimes, a more serious issue may be the cause of back pain. You should be seen right away if new problems seem to be developing. Your caregiver may recommend:  A maternity girdle.  An elastic sling.  A back brace.  A massage therapist or acupuncture. SEEK MEDICAL CARE IF:   You are not able to do most of your daily activities, even when taking the pain medicine you were given.  You need a referral to a physical therapist or chiropractor.  You want to try acupuncture. SEEK IMMEDIATE MEDICAL CARE  IF:  You develop numbness, tingling, weakness, or problems with the use of your arms or legs.  You develop severe back pain that is no longer relieved with medicines.  You have a sudden change in bowel or bladder control.  You have increasing pain in other areas of the body.  You develop shortness of breath, dizziness, or fainting.  You develop nausea, vomiting, or sweating.  You have back pain which is similar to labor pains.  You have back pain along with your water breaking or vaginal bleeding.  You have back pain or numbness that travels down your leg.  Your back pain developed after you fell.  You develop pain on one side of your back. You may have a kidney stone.  You see blood in your urine. You may have a bladder infection or kidney stone.  You have back pain with blisters. You may have shingles. Back pain is fairly common during pregnancy but should not be accepted as just part of   the process. Back pain should always be treated as soon as possible. This will make your pregnancy as pleasant as possible.   This information is not intended to replace advice given to you by your health care provider. Make sure you discuss any questions you have with your health care provider.   Document Released: 04/22/2005 Document Revised: 04/06/2011 Document Reviewed: 06/03/2010 Elsevier Interactive Patient Education [employer] Third Trimester of Pregnancy The third trimester is from week 29 through week 42, months 7 through 9. The third trimester is a time when the fetus is growing rapidly. At the end of the ninth month, the fetus is about 20 inches in length and weighs 6-10 pounds.  BODY CHANGES Your body goes through many changes during pregnancy. The changes vary from woman to woman.   Your weight will continue to increase. You can expect to gain 25-35 pounds (11-16 kg) by the end of the pregnancy.  You may begin to get stretch marks on your hips, abdomen, and  breasts.  You may urinate more often because the fetus is moving lower into your pelvis and pressing on your bladder.  You may develop or continue to have heartburn as a result of your pregnancy.  You may develop constipation because certain hormones are causing the muscles that push waste through your intestines to slow down.  You may develop hemorrhoids or swollen, bulging veins (varicose veins).  You may have pelvic pain because of the weight gain and pregnancy hormones relaxing your joints between the bones in your pelvis. Backaches may result from overexertion of the muscles supporting your posture.  You may have changes in your hair. These can include thickening of your hair, rapid growth, and changes in texture. Some women also have hair loss during or after pregnancy, or hair that feels dry or thin. Your hair will most likely return to normal after your baby is born.  Your breasts will continue to grow and be tender. A yellow discharge may leak from your breasts called colostrum.  Your belly button may stick out.  You may feel short of breath because of your expanding uterus.  You may notice the fetus "dropping," or moving lower in your abdomen.  You may have a bloody mucus discharge. This usually occurs a few days to a week before labor begins.  Your cervix becomes thin and soft (effaced) near your due date. WHAT TO EXPECT AT YOUR PRENATAL EXAMS  You will have prenatal exams every 2 weeks until week 36. Then, you will have weekly prenatal exams. During a routine prenatal visit:  You will be weighed to make sure you and the fetus are growing normally.  Your blood pressure is taken.  Your abdomen will be measured to track your baby's growth.  The fetal heartbeat will be listened to.  Any test results from the previous visit will be discussed.  You may have a cervical check near your due date to see if you have effaced. At around 36 weeks, your caregiver will check your  cervix. At the same time, your caregiver will also perform a test on the secretions of the vaginal tissue. This test is to determine if a type of bacteria, Group B streptococcus, is present. Your caregiver will explain this further. Your caregiver may ask you:  What your birth plan is.  How you are feeling.  If you are feeling the baby move.  If you have had any abnormal symptoms, such as leaking fluid, bleeding, severe headaches, or  abdominal cramping.  If you are using any tobacco products, including cigarettes, chewing tobacco, and electronic cigarettes.  If you have any questions. Other tests or screenings that may be performed during your third trimester include:  Blood tests that check for low iron levels (anemia).  Fetal testing to check the health, activity level, and growth of the fetus. Testing is done if you have certain medical conditions or if there are problems during the pregnancy.  HIV (human immunodeficiency virus) testing. If you are at high risk, you may be screened for HIV during your third trimester of pregnancy. FALSE LABOR You may feel small, irregular contractions that eventually go away. These are called Braxton Hicks contractions, or false labor. Contractions may last for hours, days, or even weeks before true labor sets in. If contractions come at regular intervals, intensify, or become painful, it is best to be seen by your caregiver.  SIGNS OF LABOR   Menstrual-like cramps.  Contractions that are 5 minutes apart or less.  Contractions that start on the top of the uterus and spread down to the lower abdomen and back.  A sense of increased pelvic pressure or back pain.  A watery or bloody mucus discharge that comes from the vagina. If you have any of these signs before the 37th week of pregnancy, call your caregiver right away. You need to go to the hospital to get checked immediately. HOME CARE INSTRUCTIONS   Avoid all smoking, herbs, alcohol, and  unprescribed drugs. These chemicals affect the formation and growth of the baby.  Do not use any tobacco products, including cigarettes, chewing tobacco, and electronic cigarettes. If you need help quitting, ask your health care provider. You may receive counseling support and other resources to help you quit.  Follow your caregiver's instructions regarding medicine use. There are medicines that are either safe or unsafe to take during pregnancy.  Exercise only as directed by your caregiver. Experiencing uterine cramps is a good sign to stop exercising.  Continue to eat regular, healthy meals.  Wear a good support bra for breast tenderness.  Do not use hot tubs, steam rooms, or saunas.  Wear your seat belt at all times when driving.  Avoid raw meat, uncooked cheese, cat litter boxes, and soil used by cats. These carry germs that can cause birth defects in the baby.  Take your prenatal vitamins.  Take 1500-2000 mg of calcium daily starting at the 20th week of pregnancy until you deliver your baby.  Try taking a stool softener (if your caregiver approves) if you develop constipation. Eat more high-fiber foods, such as fresh vegetables or fruit and whole grains. Drink plenty of fluids to keep your urine clear or pale yellow.  Take warm sitz baths to soothe any pain or discomfort caused by hemorrhoids. Use hemorrhoid cream if your caregiver approves.  If you develop varicose veins, wear support hose. Elevate your feet for 15 minutes, 3-4 times a day. Limit salt in your diet.  Avoid heavy lifting, wear low heal shoes, and practice good posture.  Rest a lot with your legs elevated if you have leg cramps or low back pain.  Visit your dentist if you have not gone during your pregnancy. Use a soft toothbrush to brush your teeth and be gentle when you floss.  A sexual relationship may be continued unless your caregiver directs you otherwise.  Do not travel far distances unless it is  absolutely necessary and only with the approval of your caregiver.  Take  prenatal classes to understand, practice, and ask questions about the labor and delivery.  Make a trial run to the hospital.  Pack your hospital bag.  Prepare the baby's nursery.  Continue to go to all your prenatal visits as directed by your caregiver. SEEK MEDICAL CARE IF:  You are unsure if you are in labor or if your water has broken.  You have dizziness.  You have mild pelvic cramps, pelvic pressure, or nagging pain in your abdominal area.  You have persistent nausea, vomiting, or diarrhea.  You have a bad smelling vaginal discharge.  You have pain with urination. SEEK IMMEDIATE MEDICAL CARE IF:   You have a fever.  You are leaking fluid from your vagina.  You have spotting or bleeding from your vagina.  You have severe abdominal cramping or pain.  You have rapid weight loss or gain.  You have shortness of breath with chest pain.  You notice sudden or extreme swelling of your face, hands, ankles, feet, or legs.  You have not felt your baby move in over an hour.  You have severe headaches that do not go away with medicine.  You have vision changes.   This information is not intended to replace advice given to you by your health care provider. Make sure you discuss any questions you have with your health care provider.   Document Released: 01/06/2001 Document Revised: 02/02/2014 Document Reviewed: 03/15/2012 Elsevier Interactive Patient Education [employer]

## 2015-10-18 NOTE — Progress Notes (Signed)
Pt fell yesterday in the bathtub and landed on her back. She has a fetal monitor at home and used it and said everything was normal.

## 2015-10-18 NOTE — Progress Notes (Signed)
Pt. Declined flu shot 

## 2015-10-25 ENCOUNTER — Ambulatory Visit (INDEPENDENT_AMBULATORY_CARE_PROVIDER_SITE_OTHER): Payer: 59 | Admitting: Licensed Clinical Social Worker

## 2015-10-25 DIAGNOSIS — F410 Panic disorder [episodic paroxysmal anxiety] without agoraphobia: Secondary | ICD-10-CM

## 2015-10-29 ENCOUNTER — Ambulatory Visit (INDEPENDENT_AMBULATORY_CARE_PROVIDER_SITE_OTHER): Payer: 59 | Admitting: Obstetrics & Gynecology

## 2015-10-29 VITALS — BP 104/74 | HR 107 | Wt 263.0 lb

## 2015-10-29 DIAGNOSIS — Z3483 Encounter for supervision of other normal pregnancy, third trimester: Secondary | ICD-10-CM | POA: Diagnosis not present

## 2015-10-29 DIAGNOSIS — Z3480 Encounter for supervision of other normal pregnancy, unspecified trimester: Secondary | ICD-10-CM

## 2015-10-29 DIAGNOSIS — D680 Von Willebrand's disease: Secondary | ICD-10-CM

## 2015-10-29 DIAGNOSIS — D68020 Von Willebrand disease, type 2a: Secondary | ICD-10-CM

## 2015-10-29 NOTE — Progress Notes (Signed)
   PRENATAL VISIT NOTE  Subjective:  Edward Jollymber Brownstein is a 30 y.o. G2P1001 at [redacted]w[redacted]d being seen today for ongoing prenatal care.  She is currently monitored for the following issues for this high-risk pregnancy and has Anxiety state; Cervical cancer screening; History of PCOS; History of goiter; Family history of depression; Family history of diabetes mellitus in first degree relative; Family history of heart disease; History of breast augmentation; History of abdominoplasty; History of cholecystectomy; History of tonsillectomy; Generalized anxiety disorder; Encounter for supervision of normal pregnancy in multigravida; Vasovagal near syncope; History of dysphagia; Von Willebrand disease, type IIa (HCC); and Headache in pregnancy, antepartum on her problem list.  Patient reports anxious about moving to [place] today..  Contractions: Not present. Vag. Bleeding: None.  Movement: Present. Denies leaking of fluid.   The following portions of the patient's history were reviewed and updated as appropriate: allergies, current medications, past family history, past medical history, past social history, past surgical history and problem list. Problem list updated.  Objective:   Vitals:   10/29/15 0921  BP: 104/74  Pulse: (!) 107  Weight: 263 lb (119.3 kg)    Fetal Status: Fetal Heart Rate (bpm): 132   Movement: Present     General:  Alert, oriented and cooperative. Patient is in no acute distress.  Skin: Skin is warm and dry. No rash noted.   Cardiovascular: Normal heart rate noted  Respiratory: Normal respiratory effort, no problems with respiration noted  Abdomen: Soft, gravid, appropriate for gestational age. Pain/Pressure: Present     Pelvic:  Cervical exam performed        Extremities: Normal range of motion.  Edema: None  Mental Status: Normal mood and affect. Normal behavior. Normal judgment and thought content.   Urinalysis: Urine Protein: Trace Urine Glucose: Negative  Assessment and  Plan:  Pregnancy: G2P1001 at [redacted]w[redacted]d  1. Encounter for supervision of normal pregnancy in multigravida -medical compression stockings and ambulating every hour on plane -Hydroxyzine is safe in pregnancy (limited use) -Cervix closed, long, with tone, posterior  2. Von Willebrand disease, type IIa (HCC) No bleeding problems  Preterm labor symptoms and general obstetric precautions including but not limited to vaginal bleeding, contractions, leaking of fluid and fetal movement were reviewed in detail with the patient. Please refer to After Visit Summary for other counseling recommendations.  Return in about 2 weeks (around 11/12/2015).  Lesly DukesKelly H Leggett, MD

## 2016-01-29 ENCOUNTER — Telehealth: Payer: Self-pay

## 2016-01-29 DIAGNOSIS — F411 Generalized anxiety disorder: Secondary | ICD-10-CM

## 2016-01-29 MED ORDER — SERTRALINE HCL 50 MG PO TABS: 50.0000 mg | ORAL_TABLET | Freq: Every day | ORAL | 0 refills | Status: DC

## 2016-01-29 NOTE — Telephone Encounter (Signed)
Patient called requested a refill for Zoloft. 50 mg.[facility]. Patient has appt scheduled for 02/06/2016 for follow up. Patient is aware. Tipton Ballow,CMA

## 2016-02-06 ENCOUNTER — Encounter: Payer: Self-pay | Admitting: Osteopathic Medicine

## 2016-02-06 ENCOUNTER — Ambulatory Visit (INDEPENDENT_AMBULATORY_CARE_PROVIDER_SITE_OTHER): Payer: 59 | Admitting: Osteopathic Medicine

## 2016-02-06 VITALS — BP 108/73 | HR 67 | Wt 271.0 lb

## 2016-02-06 DIAGNOSIS — F411 Generalized anxiety disorder: Secondary | ICD-10-CM | POA: Diagnosis not present

## 2016-02-06 DIAGNOSIS — R5383 Other fatigue: Secondary | ICD-10-CM | POA: Diagnosis not present

## 2016-02-06 LAB — CBC WITH DIFFERENTIAL/PLATELET
BASOS PCT: 1 %
Basophils Absolute: 60 cells/uL (ref 0–200)
EOS PCT: 3 %
Eosinophils Absolute: 180 cells/uL (ref 15–500)
HCT: 40.7 % (ref 35.0–45.0)
Hemoglobin: 13.3 g/dL (ref 11.7–15.5)
LYMPHS PCT: 31 %
Lymphs Abs: 1860 cells/uL (ref 850–3900)
MCH: 30.2 pg (ref 27.0–33.0)
MCHC: 32.7 g/dL (ref 32.0–36.0)
MCV: 92.5 fL (ref 80.0–100.0)
MONOS PCT: 7 %
MPV: 9.1 fL (ref 7.5–12.5)
Monocytes Absolute: 420 cells/uL (ref 200–950)
Neutro Abs: 3480 cells/uL (ref 1500–7800)
Neutrophils Relative %: 58 %
PLATELETS: 320 10*3/uL (ref 140–400)
RBC: 4.4 MIL/uL (ref 3.80–5.10)
RDW: 13.5 % (ref 11.0–15.0)
WBC: 6 10*3/uL (ref 3.8–10.8)

## 2016-02-06 LAB — COMPLETE METABOLIC PANEL WITH GFR
ALT: 14 U/L (ref 6–29)
AST: 16 U/L (ref 10–30)
Albumin: 4.3 g/dL (ref 3.6–5.1)
Alkaline Phosphatase: 69 U/L (ref 33–115)
BUN: 19 mg/dL (ref 7–25)
CHLORIDE: 99 mmol/L (ref 98–110)
CO2: 31 mmol/L (ref 20–31)
CREATININE: 0.7 mg/dL (ref 0.50–1.10)
Calcium: 9.5 mg/dL (ref 8.6–10.2)
GFR, Est African American: >89 mL/min (ref >=60)
GFR, Est Non African American: >89 mL/min (ref >=60)
Glucose, Bld: 89 mg/dL (ref 65–99)
POTASSIUM: 4.3 mmol/L (ref 3.5–5.3)
Sodium: 138 mmol/L (ref 135–146)
Total Bilirubin: 0.5 mg/dL (ref 0.2–1.2)
Total Protein: 7.2 g/dL (ref 6.1–8.1)

## 2016-02-06 LAB — TSH: TSH: 1.46 mIU/L

## 2016-02-06 MED ORDER — SERTRALINE HCL 50 MG PO TABS: 75.0000 mg | ORAL_TABLET | Freq: Every day | ORAL | 1 refills | Status: DC

## 2016-02-06 NOTE — Progress Notes (Signed)
HPI: Regina Andrews is a 31 y.o. Not Hispanic or Latino female  who presents to [facility] today, 02/06/16,  for chief complaint of:  Chief Complaint  Patient presents with  . anxiety/depression    ANXIETY/DEPRESSION . Context: Doing fairly well on Zoloft, successful vaginal delivery few months ago in [place], no longer breast-feeding, would like to increase dose of Zoloft if possible . Quality: anxious leaving the house and coming to the doctor - doing a bit better in this regard.  . Severity: not as bad as it was the patient still having some anxiety symptoms. Zoloft 50 mg doing well. See below for patient self reporting symptom scores . Modifying factors: Zoloft daily.   Associated symptoms: Significant fatigue after delivery of baby, not sure if this has more to do with psychological state or being up all night with the baby, would like to get labs done for this   Past medical, surgical, social and family history reviewed: Past Medical History:  Diagnosis Date  . Anxiety   . Cervical cancer screening 04/10/2015   Hx abn Pap 05/2014   . 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 Family history of depression 04/10/2015   both parents   . Family history of diabetes mellitus in first degree relative 04/10/2015   mother   . Family history of heart disease 04/10/2015  . History of abdominoplasty 04/10/2015  . History of breast augmentation 04/10/2015  . History of cholecystectomy 04/10/2015  . History of goiter 04/10/2015  . History of PCOS 04/10/2015  . History of tonsillectomy 04/10/2015   Past Surgical History:  Procedure Laterality Date  . BREAST ENHANCEMENT SURGERY    . CHOLECYSTECTOMY    . TONSILLECTOMY    . TUMMY TUCK     Social History  Substance Use Topics  . Smoking status: Never Smoker  . Smokeless tobacco: Never  Used  . Alcohol use Not on file   Family History  Problem Relation Age of Onset  . Depression Mother   . Diabetes Mother   . Depression Father   . Hyperlipidemia Father   . Hypertension Father   . Cancer Paternal Aunt      Current medication list and allergy/intolerance information reviewed:   Current Outpatient Prescriptions  Medication Sig Dispense Refill  . Ascorbic Acid (VITAMIN C) 100 MG tablet Take 100 mg by mouth daily.    . butalbital-acetaminophen-caffeine (FIORICET) 50-325-40 MG tablet Take 1-2 tablets by mouth every 6 (six) hours as needed for headache. 20 tablet 0  . Calcium Carb-Cholecalciferol (CALCIUM + D3) 600-200 MG-UNIT TABS Take by mouth daily. Take 1 tablet daily    . Cholecalciferol (VITAMIN D3) 2000 units TABS Take by mouth daily.    [name] doxylamine, Sleep, (UNISOM) 25 MG tablet Take 25 mg by mouth at bedtime as needed.    [name] FOLIC ACID PO Take 1 tablet by mouth daily. Pt takes a folic acid tablet daily, but does not know the strength.    . hydrOXYzine (ATARAX/VISTARIL) 25 MG tablet Take 1-2 tablets (25-50 mg total) by mouth 3 (three) times daily as needed for anxiety (insomnia). 90 tablet 1  . ondansetron (ZOFRAN-ODT) 8 MG disintegrating tablet Take 1 tablet (8 mg total) by mouth every 8 (eight) hours as needed for nausea. 20 tablet 3  . Prenatal Vit-Fe Fumarate-FA (MULTIVITAMIN-PRENATAL) 27-0.8 MG TABS  tablet Take 1 tablet by mouth daily at 12 noon.    . pyridoxine (B-6) 100 MG tablet Take 50 mg by mouth daily.     . sertraline (ZOLOFT) 50 MG tablet Take 1 tablet (50 mg total) by mouth daily. 90 tablet 0  . Zinc 50 MG TABS Take 50 mg by mouth daily.     No current facility-administered medications for this visit.    No Known Allergies    Review of Systems:  Constitutional:  No  fever, no chills, No recent illness, +fatigue as per HPI  Psychiatric: No  concerns with depression, +concerns with anxiety, No sleep problems, No mood problems  Exam:  BP 108/73    Pulse 67   Wt 271 lb (122.9 kg)   LMP 03/13/2015 (Exact Date)   BMI 38.88 kg/m   Constitutional: VS see above. General Appearance: alert, well-developed, well-nourished, NAD  Psychiatric: Normal judgment/insight. Normal mood and affect. Oriented x3. No SI/HI. No thought disorder.   GAD 7 : Generalized Anxiety Score 02/06/2016 04/10/2015  Nervous, Anxious, on Edge 1 3  Control/stop worrying 0 3  Worry too much - different things 0 3  Trouble relaxing 1 3  Restless 1 3  Easily annoyed or irritable 1 3  Afraid - awful might happen 0 3  Total GAD 7 Score 4 21  Anxiety Difficulty - Extremely difficult    [facility] 02/06/2016 05/15/2015 04/10/2015  Decreased Interest 0 1 3  Down, Depressed, Hopeless 0 1 3  PHQ - 2 Score 0 2 6  Altered sleeping 0 3 3  Tired, decreased energy 3 3 3   Change in appetite 2 0 3  Feeling bad or failure about yourself  1 1 3   Trouble concentrating 1 1 2   Moving slowly or fidgety/restless 0 0 2  Suicidal thoughts 0 0 0  PHQ-9 Score 7 10 22   Difficult doing work/chores - Somewhat difficult Very difficult     ASSESSMENT/PLAN:   Increase Zoloft to 1.5 tablets daily, 75 mg. Advised patient we had the option to go up to 100 mg that she will try about a month or so on the 1-1/2 dose and see how she does. She has any refills for this and for the hydroxyzine which is working well for as needed anxiety.   Labs as below for fatigue, evaluate for postpartum thyroid abnormality versus anemia versus other.   Generalized anxiety disorder - Plan: sertraline (ZOLOFT) 50 MG tablet  Fatigue, unspecified type - Plan: CBC with Differential/Platelet, COMPLETE METABOLIC PANEL WITH GFR, TSH, VITAMIN D 25 Hydroxy (Vit-D Deficiency, Fractures)     Visit summary with medication list and pertinent instructions was printed for patient to review. All questions at time of visit were answered - patient instructed to contact office with any additional concerns. ER/RTC  precautions were reviewed with the patient. Follow-up plan: Return in about 6 months (around 08/05/2016) for ANNUAL PHYSICAL AND ANXIETY FOLLOWUP, sooner if needed.

## 2016-02-07 LAB — VITAMIN D 25 HYDROXY (VIT D DEFICIENCY, FRACTURES): VIT D 25 HYDROXY: 37 ng/mL (ref 30–100)

## 2016-03-17 ENCOUNTER — Other Ambulatory Visit: Payer: Self-pay | Admitting: Osteopathic Medicine

## 2016-03-17 DIAGNOSIS — F411 Generalized anxiety disorder: Secondary | ICD-10-CM

## 2016-05-12 ENCOUNTER — Other Ambulatory Visit: Payer: Self-pay

## 2016-05-12 DIAGNOSIS — F411 Generalized anxiety disorder: Secondary | ICD-10-CM

## 2016-05-12 MED ORDER — SERTRALINE HCL 50 MG PO TABS: 75.0000 mg | ORAL_TABLET | Freq: Every day | ORAL | 0 refills | Status: DC

## 2016-05-12 NOTE — Telephone Encounter (Signed)
Patient request refill for Zoloft. 50 mg. #90 0refill sent to [facility] pharmacy. Weda Baumgarner,CMA

## 2016-07-22 ENCOUNTER — Other Ambulatory Visit: Payer: Self-pay

## 2016-07-22 DIAGNOSIS — F411 Generalized anxiety disorder: Secondary | ICD-10-CM

## 2016-07-22 MED ORDER — SERTRALINE HCL 50 MG PO TABS: 75.0000 mg | ORAL_TABLET | Freq: Every day | ORAL | 1 refills | Status: DC

## 2016-08-05 ENCOUNTER — Encounter: Payer: Self-pay | Admitting: Osteopathic Medicine

## 2016-08-05 ENCOUNTER — Ambulatory Visit (INDEPENDENT_AMBULATORY_CARE_PROVIDER_SITE_OTHER): Payer: 59 | Admitting: Osteopathic Medicine

## 2016-08-05 ENCOUNTER — Other Ambulatory Visit: Payer: Self-pay | Admitting: Osteopathic Medicine

## 2016-08-05 VITALS — BP 125/75 | HR 67 | Wt 307.0 lb

## 2016-08-05 DIAGNOSIS — Z6841 Body Mass Index (BMI) 40.0 and over, adult: Secondary | ICD-10-CM

## 2016-08-05 DIAGNOSIS — B9789 Other viral agents as the cause of diseases classified elsewhere: Secondary | ICD-10-CM | POA: Diagnosis not present

## 2016-08-05 DIAGNOSIS — F321 Major depressive disorder, single episode, moderate: Secondary | ICD-10-CM

## 2016-08-05 DIAGNOSIS — R635 Abnormal weight gain: Secondary | ICD-10-CM | POA: Diagnosis not present

## 2016-08-05 DIAGNOSIS — J069 Acute upper respiratory infection, unspecified: Secondary | ICD-10-CM | POA: Diagnosis not present

## 2016-08-05 DIAGNOSIS — F411 Generalized anxiety disorder: Secondary | ICD-10-CM

## 2016-08-05 DIAGNOSIS — M79645 Pain in left finger(s): Secondary | ICD-10-CM | POA: Insufficient documentation

## 2016-08-05 DIAGNOSIS — Z3009 Encounter for other general counseling and advice on contraception: Secondary | ICD-10-CM

## 2016-08-05 DIAGNOSIS — Z0000 Encounter for general adult medical examination without abnormal findings: Secondary | ICD-10-CM

## 2016-08-05 LAB — COMPLETE METABOLIC PANEL WITH GFR
ALBUMIN: 4.7 g/dL (ref 3.6–5.1)
ALK PHOS: 78 U/L (ref 33–115)
ALT: 18 U/L (ref 6–29)
AST: 16 U/L (ref 10–30)
BILIRUBIN TOTAL: 0.5 mg/dL (ref 0.2–1.2)
BUN: 15 mg/dL (ref 7–25)
CALCIUM: 9.8 mg/dL (ref 8.6–10.2)
CO2: 26 mmol/L (ref 20–31)
CREATININE: 0.64 mg/dL (ref 0.50–1.10)
Chloride: 97 mmol/L — ABNORMAL LOW (ref 98–110)
GFR, Est Non African American: >89 mL/min (ref >=60)
Glucose, Bld: 100 mg/dL — ABNORMAL HIGH (ref 65–99)
Potassium: 4.1 mmol/L (ref 3.5–5.3)
Sodium: 136 mmol/L (ref 135–146)
TOTAL PROTEIN: 7.6 g/dL (ref 6.1–8.1)

## 2016-08-05 LAB — LIPID PANEL
CHOLESTEROL: 150 mg/dL (ref <200)
HDL: 53 mg/dL (ref >50)
LDL Cholesterol: 81 mg/dL (ref <100)
Total CHOL/HDL Ratio: 2.8 Ratio (ref <5.0)
Triglycerides: 79 mg/dL (ref <150)
VLDL: 16 mg/dL (ref <30)

## 2016-08-05 LAB — CBC
HEMATOCRIT: 43 % (ref 35.0–45.0)
HEMOGLOBIN: 14.4 g/dL (ref 11.7–15.5)
MCH: 31.2 pg (ref 27.0–33.0)
MCHC: 33.5 g/dL (ref 32.0–36.0)
MCV: 93.1 fL (ref 80.0–100.0)
MPV: 10 fL (ref 7.5–12.5)
Platelets: 316 10*3/uL (ref 140–400)
RBC: 4.62 MIL/uL (ref 3.80–5.10)
RDW: 13.1 % (ref 11.0–15.0)
WBC: 7.2 10*3/uL (ref 3.8–10.8)

## 2016-08-05 LAB — TSH: TSH: 1.12 mIU/L

## 2016-08-05 MED ORDER — PHENTERMINE HCL 30 MG PO CAPS: 30.0000 mg | ORAL_CAPSULE | ORAL | 0 refills | Status: DC

## 2016-08-05 NOTE — Progress Notes (Signed)
HPI: Regina Andrews is a 31 y.o. female  who presents to [facility] today, 08/05/16,  for chief complaint of:  Chief Complaint  Patient presents with  . Anxiety   Patient here for annual physical / wellness exam.  See preventive care reviewed as below.  Recent labs reviewed in detail with the patient.   Additional concerns today include:   Recheck anxiety: Has been on Zoloft 50 mg daily as of last visit 6 mos ago.    L thumb pain x8 weeks - located more in wriist but hurts with thumb movement, no injury, some associated carpal tunnel on L as well   Obesity: 40 lb weight gain since 11/2016 - trying to do better with diet, difficult to exercise with the baby, and wants to do one thing at a time so wants to get good with diet first.   Cold vs allergies: cough and runny nose for the past few days, no fever, no sick contacts.     Past medical, surgical, social and family history reviewed: Patient Active Problem List   Diagnosis Date Noted  . Headache in pregnancy, antepartum 09/09/2015  . Von Willebrand disease, type IIa (HCC) 07/12/2015  . History of dysphagia 06/20/2015  . Vasovagal near syncope 06/11/2015  . Encounter for supervision of normal pregnancy in multigravida 05/13/2015  . Generalized anxiety disorder 04/16/2015  . Anxiety state 04/10/2015  . Cervical cancer screening 04/10/2015  . History of PCOS 04/10/2015  . History of goiter 04/10/2015  . Family history of depression 04/10/2015  . Family history of diabetes mellitus in first degree relative 04/10/2015  . Family history of heart disease 04/10/2015  . History of breast augmentation 04/10/2015  . History of abdominoplasty 04/10/2015  . History of cholecystectomy 04/10/2015  . History of tonsillectomy 04/10/2015   Past Surgical History:  Procedure Laterality Date  . BREAST ENHANCEMENT SURGERY    . CHOLECYSTECTOMY    . TONSILLECTOMY    . TUMMY TUCK     Social History   Substance Use Topics  . Smoking status: Never Smoker  . Smokeless tobacco: Never Used  . Alcohol use Not on file   Family History  Problem Relation Age of Onset  . Depression Mother   . Diabetes Mother   . Depression Father   . Hyperlipidemia Father   . Hypertension Father   . Cancer Paternal Aunt      Current medication list and allergy/intolerance information reviewed:   Current Outpatient Prescriptions  Medication Sig Dispense Refill  . AMBULATORY NON FORMULARY MEDICATION Medication Name: Spirulina    . Ascorbic Acid (VITAMIN C) 100 MG tablet Take 100 mg by mouth daily.    . Calcium Carb-Cholecalciferol (CALCIUM + D3) 600-200 MG-UNIT TABS Take by mouth daily. Take 1 tablet daily    . FOLIC ACID PO Take 1 tablet by mouth daily. Pt takes a folic acid tablet daily, but does not know the strength.    . hydrOXYzine (ATARAX/VISTARIL) 25 MG tablet Take 1-2 tablets (25-50 mg total) by mouth 3 (three) times daily as needed for anxiety (insomnia). 90 tablet 1  . MELATONIN PO Take by mouth.    . Multiple Vitamin (MULTIVITAMIN) tablet Take 1 tablet by mouth daily.    [name]. pyridoxine (B-6) 100 MG tablet Take 50 mg by mouth daily.     . sertraline (ZOLOFT) 50 MG tablet Take 1.5 tablets (75 mg total) by mouth daily. 135 tablet 1  . Zinc 50 MG TABS Take  50 mg by mouth daily.     No current facility-administered medications for this visit.    No Known Allergies    Review of Systems:  Constitutional:  No  fever, no chills, +recent illness, +unintentional weight changes. +significant fatigue.   HEENT: No  headache, no vision change, no hearing change, +sore throat, No  sinus pressure  Cardiac: No  chest pain, No  pressure, No palpitations  Respiratory:  No  shortness of breath. +Cough  Gastrointestinal: No  abdominal pain, No  nausea, No  vomiting,  No  blood in stool, No  diarrhea, No  constipation   Musculoskeletal: No new myalgia/arthralgia  Genitourinary: No  incontinence, No   abnormal genital bleeding, No abnormal genital discharge  Skin: No  Rash  Hem/Onc: No  easy bruising/bleeding  Endocrine: No cold intolerance,  No heat intolerance. No polyuria/polydipsia/polyphagia   Neurologic: No  weakness, No  dizziness  Psychiatric: No  concerns with depression, +concerns with anxiety, No sleep problems, No mood problems  Exam:  BP 125/75   Pulse 67   Wt (!) 307 lb (139.3 kg)   BMI 44.05 kg/m   Constitutional: VS see above. General Appearance: alert, well-developed, well-nourished, NAD  Eyes: Normal lids and conjunctive, non-icteric sclera  Ears, Nose, Mouth, Throat: MMM, Normal external inspection ears/nares/mouth/lips/gums. TM normal bilaterally. Pharynx/tonsils no erythema, no exudate. Nasal mucosa normal.   Neck: No masses, trachea midline. No thyroid enlargement. No tenderness/mass appreciated. No lymphadenopathy  Respiratory: Normal respiratory effort. no wheeze, no rhonchi, no rales  Cardiovascular: S1/S2 normal, no murmur, no rub/gallop auscultated. RRR. No lower extremity edema.   Gastrointestinal: Nontender, no masses. No hepatomegaly, no splenomegaly. No hernia appreciated. Bowel sounds normal. Rectal exam deferred.   Musculoskeletal: Gait normal. No clubbing/cyanosis of digits. Neg lateral epicondylar pain, (+)tinel's on L, (+)pain with thumb abduction  Neurological: Normal balance/coordination. No tremor.  Skin: warm, dry, intact.   Psychiatric: Normal judgment/insight. Normal mood and affect. Oriented x3.     ASSESSMENT/PLAN:   Annual physical exam - Plan: CBC, COMPLETE METABOLIC PANEL WITH GFR, Lipid panel, TSH  Generalized anxiety disorder - consider augmentation, consider increase Zoloft dose, pt will think about this  Major depressive disorder, single episode, moderate (HCC)  Pain of left thumb - likely tendinitis, trial splint and OTC antiinflammatories, f/u w/ sports if no better   Weight gain - significant weight gain  past 6 mos - labs and mid-dose phentermine given her hx anxiety don't want higher dose stimulants  - Plan: CBC, COMPLETE METABOLIC PANEL WITH GFR, Lipid panel, TSH, phentermine 30 MG capsule  BMI 40.0-44.9, adult (HCC) - Plan: phentermine 30 MG capsule  Viral URI with cough - supportive care  Encounter for other general counseling or advice on contraception - abstinence currently, advised let me know if would like another method/device/Rx  FEMALE PREVENTIVE CARE Updated 08/05/16   ANNUAL SCREENING/COUNSELING  Diet/Exercise - HEALTHY HABITS DISCUSSED TO DECREASE CV RISK History  Smoking Status  . Never Smoker  Smokeless Tobacco  . Never Used   History  Alcohol use Not on file  rare use  Depression screen [facility] 2/9 08/05/2016  Decreased Interest 0  Down, Depressed, Hopeless 1  PHQ - 2 Score 1  Altered sleeping 0  Tired, decreased energy 1  Change in appetite 2  Feeling bad or failure about yourself  0  Trouble concentrating 0  Moving slowly or fidgety/restless 0  Suicidal thoughts 0  PHQ-9 Score 4  Difficult doing work/chores -  Domestic violence concerns - no  HTN SCREENING - SEE VITALS  SEXUAL HEALTH  Sexually active in the past year - Yes with female.  Need/want STI testing today? - no  Concerns about libido or pain with sex? - no  Plans for pregnancy? - none at this time   INFECTIOUS DISEASE SCREENING  HIV - does not need  GC/CT - does not need  HepC - DOB [birth year] - does not need  TB - does not need  DISEASE SCREENING  Lipid - needs  DM2 - does not need  Osteoporosis - women age 25+ - does not need  CANCER SCREENING  Cervical - NILM & (-)HPV 04/2015 does not need  Breast - does not need  Lung - does not need  Colon - does not need  ADULT VACCINATION  Influenza - annual vaccine recommended  Td - booster every 10 years   Zoster - option at 50, yes at 60+   PCV13 - was not indicated  PPSV23 - was not indicated Immunization  History  Administered Date(s) Administered  . Tdap 10/02/2015      See patient printed instructions!    Visit summary with medication list and pertinent instructions was printed for patient to review. All questions at time of visit were answered - patient instructed to contact office with any additional concerns. ER/RTC precautions were reviewed with the patient. Follow-up plan: Return in about 4 weeks (around 09/02/2016) for NURSE VISIT - WEIGHT CHECK, and see Dr A in 3 months to discuss long-term obesity medications .

## 2016-08-05 NOTE — Patient Instructions (Signed)
Plan:  Labs to check cholesterol, thyroid, liver, others.   Xray thumb and will splint for now  If thumb is not better or if it gets worse, I would recommend follow-up with one of our sports medicine specialists (Dr Denyse Amass or Dr. Cherylann Parr Dr. Karie Schwalbe) for further evaluation in 2-4 weeks. Just call our office and ask for an appointment for sports medicine!   See below for weight loss stuff!     Weight loss: important things to remember  It is hard work! You will have setbacks, but don't get discouraged. The goal is not short-term success, it is long-term health.   Looking at the numbers is important to track your progress and set goals, but how you are feeling and your overall health are the most important things! BMI and pounds and calories and miles logged aren't everything - they are tools to help [place] reach your goals.  You can do this!!!   Things to remember for exercise for weight loss:   Please note - I am not a certified personal trainer. I can present you with ideas and general workout goals, but an exercise program is largely up to you. Find something you can stick with, and something you enjoy!   As you progress in your exercise regimen think about gradually increasing the following, week by week:   intensity (how strenuous is your workout)  frequency (how often you are exercising)  duration (how many minutes at a time you are exercising)  Walking for 20 minutes a day is certainly better than nothing, but more strenuous exercise will develop better cardiovascular fitness.   interval training (high-intensity alternating with low-intensity, think walk/jog rather than just walk)  muscle strengthening exercises (weight lifting, calisthenics, yoga) - this also helps prevent osteoporosis!   Things to remember for diet changes for weight loss:   Please note - I am not a certified dietician. I can present you with ideas and general diet goals, but a meal plan is largely up  to you. I am happy to refer you to a dietician who can give you a detailed meal plan.  Apps/logs are crucial to track how you're eating! It's not realistic to be logging everything you eat forever, but when you're starting a healthy eating lifestyle it's very helpful, and checking in with logs now and then helps you stick to your program!   Calorie restriction with the goal weight loss of no more than one to one and a half pounds per week.   Increase lean protein such as chicken, fish, [place].   Decrease fatty foods such as dairy, butter.   Decrease sugary foods. Avoid sugary drinks such as soda or juice.  Increase fiber found in fruit and vegetables.   Medications approved for long-term use for obesity  Qsymia (Phentermine and Topiramate)  Saxenda (Liraglutide 3 mg/day)  Contrave (Bupropion and Naltrexone)  Lorcaserin (Belviq or Belviq XR)  Orlistat (Xenical, Alli)  Bupropion (Wellbutrin) I recommend that you research the above medications and see which one(s) your insurance may or may not cover: If you call your insurance, ask them specifically what medications are on their formulary that are approved for obesity treatment. They should be able to send you a list or tell you over the phone. Remember, medications aren't magic! You MUST be diligent about lifestyle changes as well!    Over-the-Counter Medications & Home Remedies for Upper Respiratory Illness  Note: the following list assumes no pregnancy, normal liver & kidney function and  no other drug interactions. Dr. Lyn HollingsheadAlexander has highlighted medications which are safe for you to use, but these may not be appropriate for everyone. Always ask a pharmacist or qualified medical provider if you have any questions!   Aches/Pains, Fever, Headache Acetaminophen (Tylenol) 500 mg tablets - take max 2 tablets (1000 mg) every 6 hours (4 times per day)  Ibuprofen (Motrin) 200 mg tablets - take max 4 tablets (800 mg) every 6 hours*  Sinus  Congestion Prescription Atrovent as directed Cromolyn Nasal Spray (NasalCrom) 1 spray each nostril 3-4 times per day, max 6 imes per day Nasal Saline if desired Oxymetolazone (Afrin, others) sparing use due to rebound congestion, NEVER use in kids Phenylephrine (Sudafed) 10 mg tablets every 4 hours (or the 12-hour formulation)* Diphenhydramine (Benadryl) 25 mg tablets - take max 2 tablets every 4 hours  Cough & Sore Throat Prescription cough pills or syrups as directed Dextromethorphan (Robitussin, others) - cough suppressant Guaifenesin (Robitussin, Mucinex, others) - expectorant (helps cough up mucus) (Dextromethorphan and Guaifenesin also come in a combination tablet) Lozenges w/ Benzocaine + Menthol (Cepacol) Honey - as much as you want! Teas which "coat the throat" - look for ingredients Elm Bark, Licorice Root, Marshmallow Root  Other Antibiotics if these are prescribed - take ALL, even if you're feeling better  Zinc Lozenges within 24 hours of symptoms onset - mixed evidence this shortens the duration of the common cold Don't waste your money on Vitamin C or Echinacea  *Caution in patients with high blood pressure     Please note: Preventive care issues were addressed today per annual physical requirements and should be covered under your insurance, however there were other medical issues which were also addressed and insurance may bill you separately for "problem-based visit." Any questions or concerns about charges which may appear on your statement should be directed to your insurance company or to [facility] billing department, please contact our office with any other questions.

## 2016-08-07 LAB — HEMOGLOBIN A1C
Hgb A1c MFr Bld: 5.1 % (ref <5.7)
Mean Plasma Glucose: 100 mg/dL

## 2016-08-17 ENCOUNTER — Ambulatory Visit (INDEPENDENT_AMBULATORY_CARE_PROVIDER_SITE_OTHER): Payer: 59 | Admitting: Licensed Clinical Social Worker

## 2016-08-17 DIAGNOSIS — F419 Anxiety disorder, unspecified: Secondary | ICD-10-CM

## 2016-09-02 ENCOUNTER — Ambulatory Visit: Payer: Self-pay

## 2016-09-08 ENCOUNTER — Ambulatory Visit: Payer: Self-pay

## 2016-09-15 ENCOUNTER — Ambulatory Visit (INDEPENDENT_AMBULATORY_CARE_PROVIDER_SITE_OTHER): Payer: 59 | Admitting: Osteopathic Medicine

## 2016-09-15 DIAGNOSIS — R635 Abnormal weight gain: Secondary | ICD-10-CM

## 2016-09-15 DIAGNOSIS — Z6841 Body Mass Index (BMI) 40.0 and over, adult: Secondary | ICD-10-CM

## 2016-09-15 MED ORDER — PHENTERMINE HCL 30 MG PO CAPS: 30.0000 mg | ORAL_CAPSULE | ORAL | 0 refills | Status: DC

## 2016-09-15 NOTE — Progress Notes (Signed)
OK to refill today  OK to remain on Phentermine x3-4 mos total

## 2016-09-15 NOTE — Progress Notes (Signed)
Patient doing well on appetite suppressant.  Here for nurse visit, weight, BP, HR check.  Denies problems with insomnia, heart palpitations or tremors.  Satisfied with weight loss thus far and is working on [employer] and regular exercise.     Patient has been on the medication for about 3 weeks total because he stopped taking it when she was sick and she sometimes forgets to take the medication. Patient is down 6 lbs.

## 2016-09-16 ENCOUNTER — Ambulatory Visit: Payer: Self-pay | Admitting: Licensed Clinical Social Worker

## 2016-10-13 ENCOUNTER — Ambulatory Visit: Payer: Self-pay | Admitting: Osteopathic Medicine

## 2016-10-13 DIAGNOSIS — Z0189 Encounter for other specified special examinations: Secondary | ICD-10-CM

## 2016-10-18 IMAGING — US US OB TRANSVAGINAL
1 series · 14 of 28 positions shown · non-contrast
Comparison: None.

CLINICAL DATA: Dating.  Positive pregnancy test

EXAM:
OBSTETRIC <14 WK US AND TRANSVAGINAL OB US
TECHNIQUE: Both transabdominal and transvaginal ultrasound examinations were
performed for complete evaluation of the gestation as well as the
maternal uterus, adnexal regions, and pelvic cul-de-sac.
Transvaginal technique was performed to assess early pregnancy.

[Series 1: us ob transvaginal · 0.22mm/px · 14 of 92 slices shown]
[im 4/92]
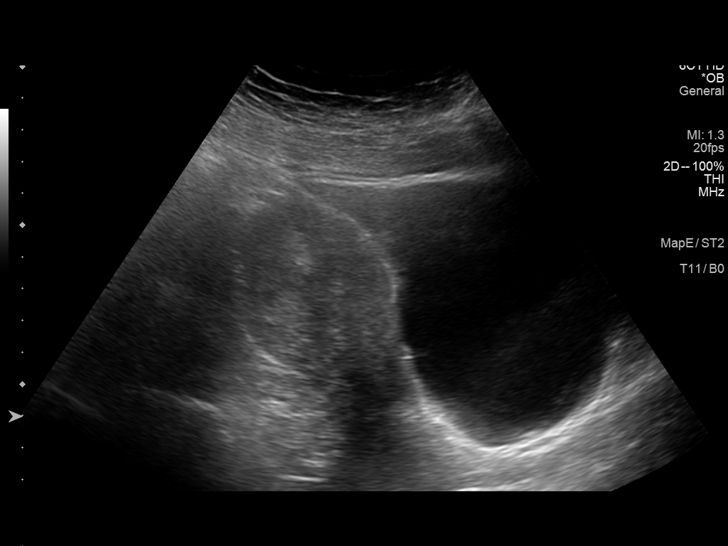
[im 11/92]
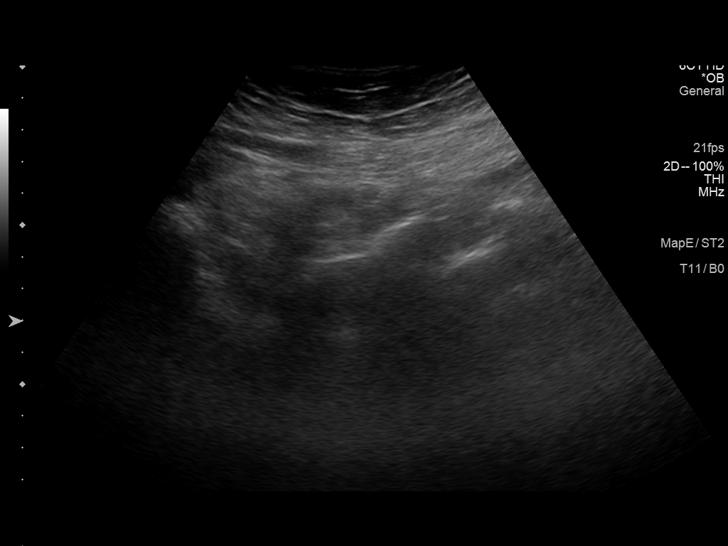
[im 17/92]
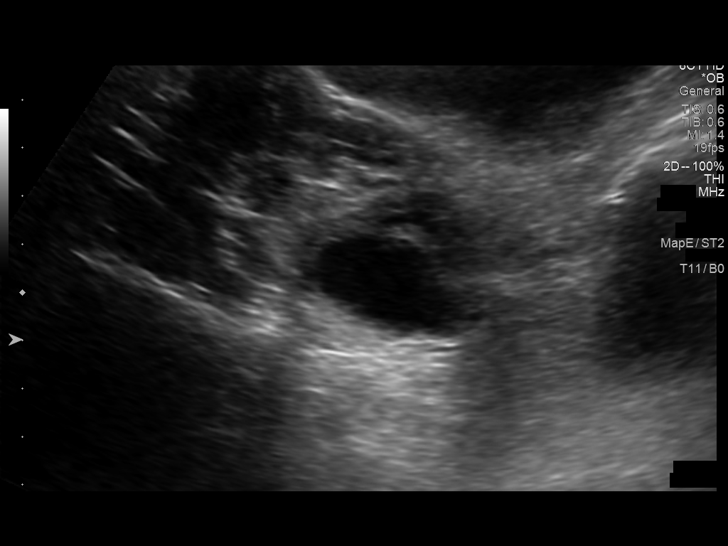
[im 24/92]
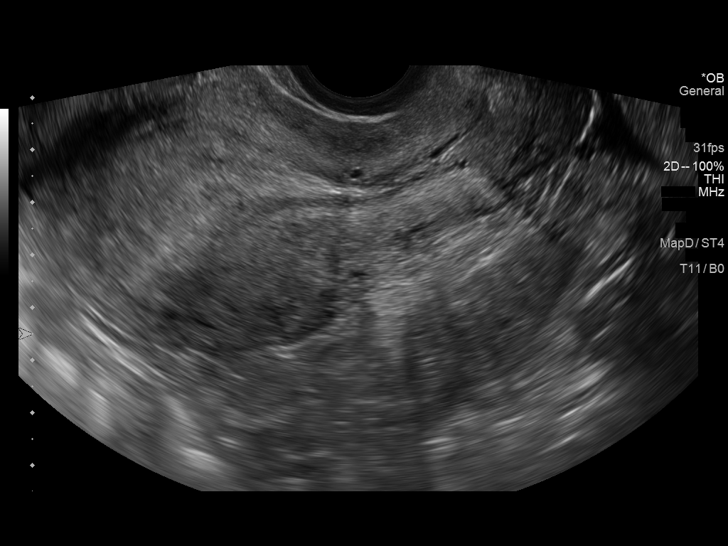
[im 31/92]
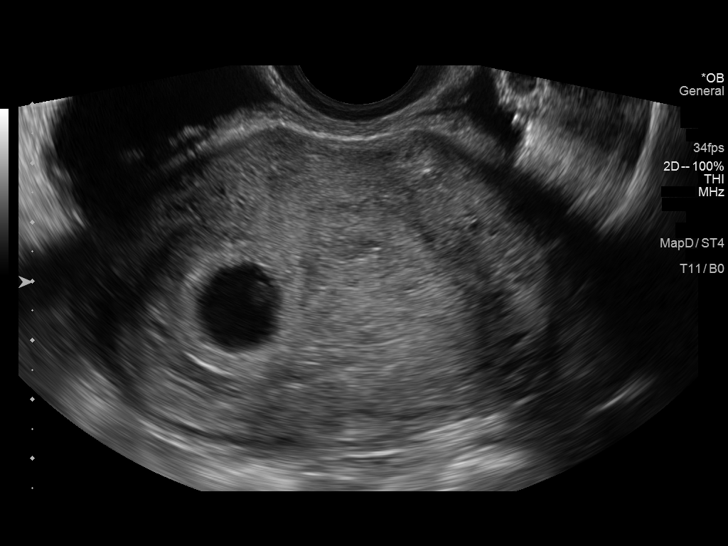
[im 38/92]
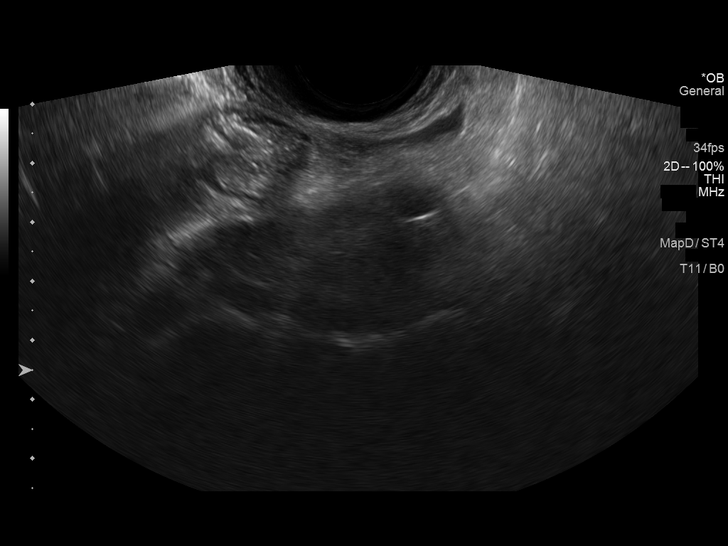
[im 44/92]
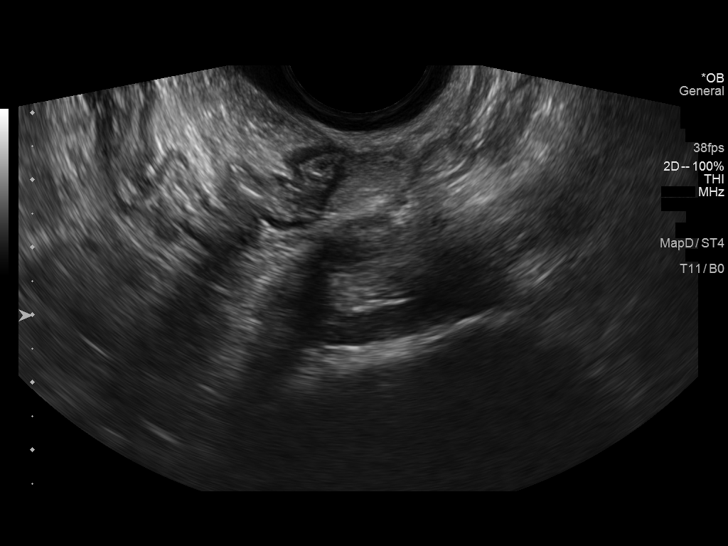
[im 51/92]
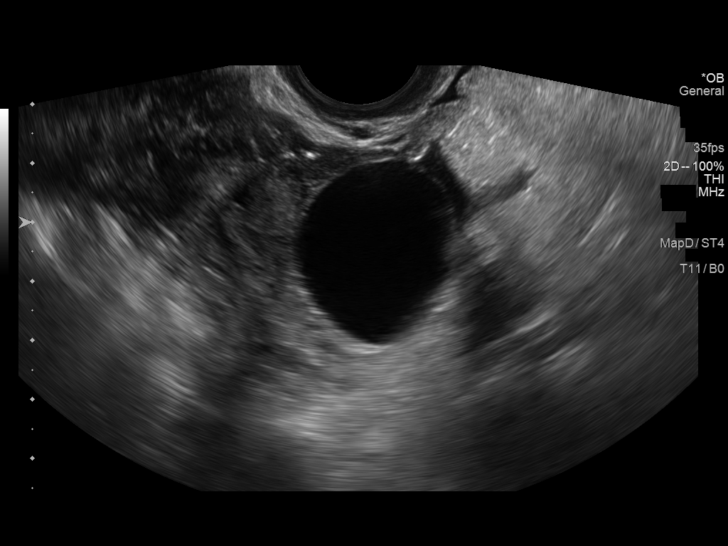
[im 58/92]
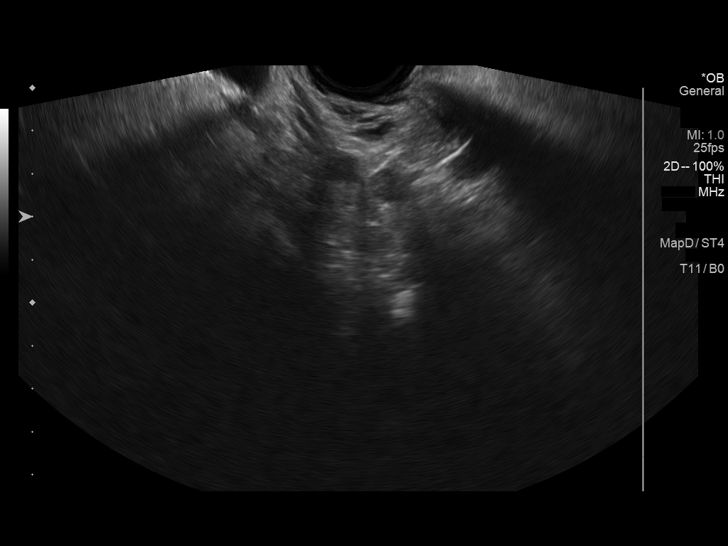
[im 65/92]
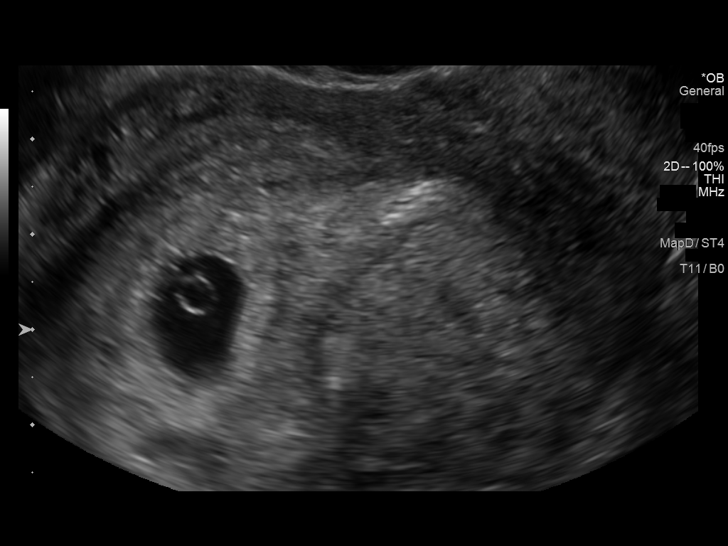
[im 71/92]
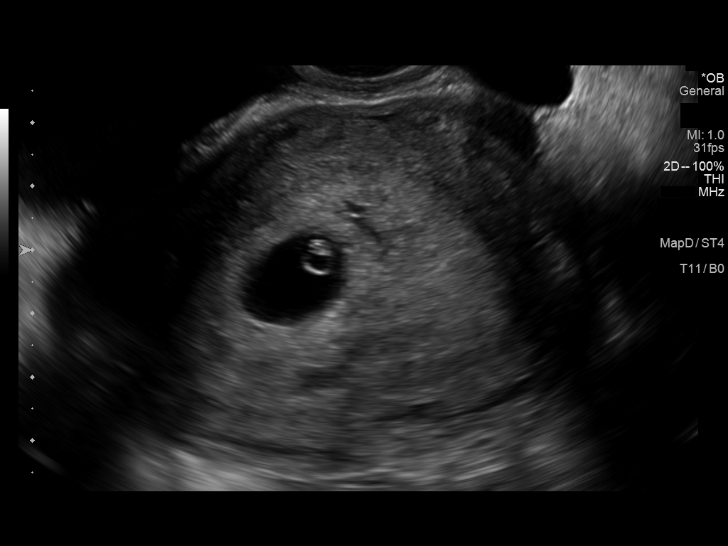
[im 78/92]
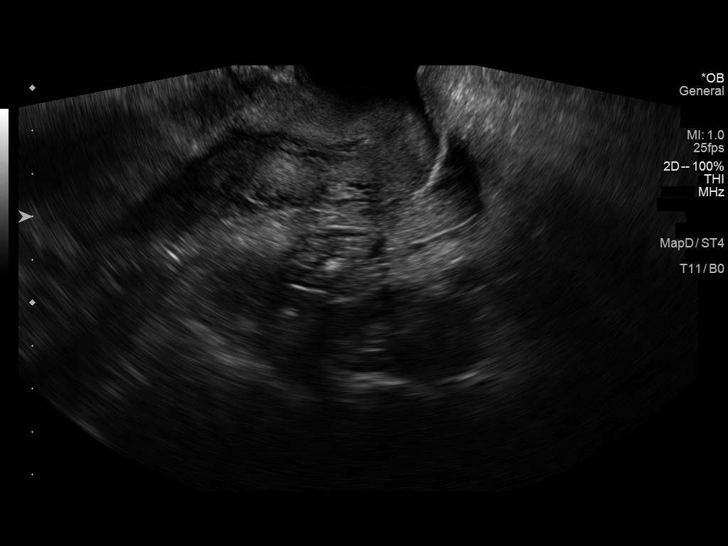
[im 85/92]
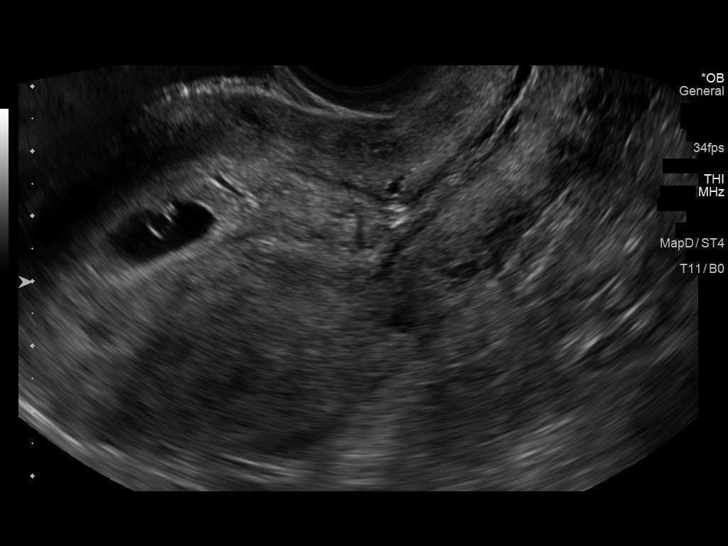
[im 92/92]
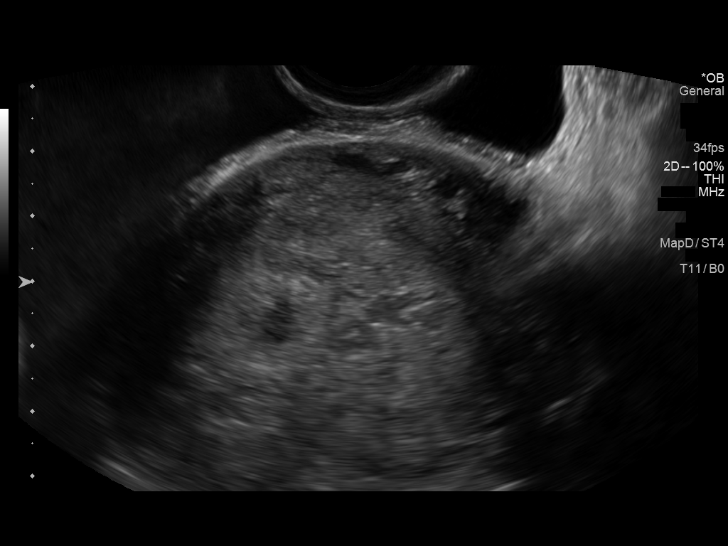

[14 of 28 positions shown; findings below may reference images not displayed]

FINDINGS: Intrauterine gestational sac: Visualized/normal in shape.

Yolk sac:  Visualized

Embryo:  Visualized

Cardiac Activity: Visualized

Heart Rate: 122  bpm

MSD:   mm    w     d

CRL:  2.5  mm   5 w   6 d                  US EDC: 12/21/2015

Subchorionic hemorrhage:  Small subchorionic hemorrhage

Maternal uterus/adnexae: 2.8 cm simple appearing cyst or dominant
follicle in the right ovary. No adnexal masses. Small amount of free
fluid in the pelvis.
IMPRESSION: Five week 6 day intrauterine pregnancy. Fetal heart rate 122 beats
per minute. Small subchorionic hemorrhage.

## 2017-01-06 ENCOUNTER — Encounter: Payer: Self-pay | Admitting: Emergency Medicine

## 2017-01-06 ENCOUNTER — Other Ambulatory Visit: Payer: Self-pay

## 2017-01-06 ENCOUNTER — Emergency Department
Admission: EM | Admit: 2017-01-06 | Discharge: 2017-01-06 | Disposition: A | Discharge status: Home / Self Care | Payer: 59 | Source: Home / Self Care | Attending: Family Medicine | Admitting: Family Medicine

## 2017-01-06 DIAGNOSIS — J020 Streptococcal pharyngitis: Secondary | ICD-10-CM | POA: Diagnosis not present

## 2017-01-06 LAB — POCT RAPID STREP A (OFFICE): RAPID STREP A SCREEN: POSITIVE — AB

## 2017-01-06 MED ORDER — PENICILLIN V POTASSIUM 500 MG PO TABS: ORAL_TABLET | ORAL | 0 refills | Status: DC

## 2017-01-06 NOTE — ED Provider Notes (Signed)
[facility]    CSN: [id number] Arrival date & time: 01/06/17  4098     History   Chief Complaint Chief Complaint  Patient presents with  . Sore Throat    HPI Regina Andrews is a 31 y.o. female.   Patient awoke with sore throat 3 days ago.  She has been fatigued but denies fever/chills, myalgias, nasal congestion, or cough.   The history is provided by the patient.    Past Medical History:  Diagnosis Date  . Anxiety   . Cervical cancer screening 04/10/2015   Hx abn Pap 05/2014   . 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 Family history of depression 04/10/2015   both parents   . Family history of diabetes mellitus in first degree relative 04/10/2015   mother   . Family history of heart disease 04/10/2015  . History of abdominoplasty 04/10/2015  . History of breast augmentation 04/10/2015  . History of cholecystectomy 04/10/2015  . History of goiter 04/10/2015  . History of PCOS 04/10/2015  . History of tonsillectomy 04/10/2015    Patient Active Problem List   Diagnosis Date Noted  . Pain of left thumb 08/05/2016  . Weight gain 08/05/2016  . Headache in pregnancy, antepartum 09/09/2015  . Von Willebrand disease, type IIa (HCC) 07/12/2015  . History of dysphagia 06/20/2015  . Vasovagal near syncope 06/11/2015  . Encounter for supervision of normal pregnancy in multigravida 05/13/2015  . Generalized anxiety disorder 04/16/2015  . Anxiety state 04/10/2015  . Cervical cancer screening 04/10/2015  . History of PCOS 04/10/2015  . History of goiter 04/10/2015  . Family history of depression 04/10/2015  . Family history of diabetes mellitus in first degree relative 04/10/2015  . Family history of heart disease 04/10/2015  . History of breast augmentation 04/10/2015  . History of abdominoplasty 04/10/2015  . History of cholecystectomy  04/10/2015  . History of tonsillectomy 04/10/2015    Past Surgical History:  Procedure Laterality Date  . BREAST ENHANCEMENT SURGERY    . CHOLECYSTECTOMY    . TONSILLECTOMY    . TUMMY TUCK      OB History    Gravida Para Term Preterm AB Living   2 2 2     2    SAB TAB Ectopic Multiple Live Births           2       Home Medications    Prior to Admission medications   Medication Sig Start Date End Date Taking? Authorizing Provider  AMBULATORY NON FORMULARY MEDICATION Medication Name: Spirulina    [provider]  Ascorbic Acid (VITAMIN C) 100 MG tablet Take 100 mg by mouth daily.    [provider]  Calcium Carb-Cholecalciferol (CALCIUM + D3) 600-200 MG-UNIT TABS Take by mouth daily. Take 1 tablet daily    [provider]  FOLIC ACID PO Take 1 tablet by mouth daily. Pt takes a folic acid tablet daily, but does not know the strength.    [provider]  hydrOXYzine (ATARAX/VISTARIL) 25 MG tablet Take 1-2 tablets (25-50 mg total) by mouth 3 (three) times daily as needed for anxiety (insomnia). 04/10/15   Sunnie Nielsen, DO  MELATONIN PO Take by mouth.    [provider]  Multiple Vitamin (MULTIVITAMIN) tablet Take 1 tablet by mouth daily.    [provider]  penicillin v  potassium (VEETID) 500 MG tablet Take one tab by mouth twice daily for 10 days 01/06/17   Lattie HawBeese, Greer Koeppen A, MD  sertraline (ZOLOFT) 50 MG tablet Take 1.5 tablets (75 mg total) by mouth daily. 07/22/16   Agapito GamesMetheney, Catherine D, MD  Zinc 50 MG TABS Take 50 mg by mouth daily.    [provider]    Family History Family History  Problem Relation Age of Onset  . Depression Mother   . Diabetes Mother   . Depression Father   . Hyperlipidemia Father   . Hypertension Father   . Cancer Paternal Aunt     Social History Social History   Tobacco Use  . Smoking status: Never Smoker  . Smokeless tobacco: Never Used  Substance Use Topics  . Alcohol use:  Not on file  . Drug use: Not on file     Allergies   Patient has no known allergies.   Review of Systems Review of Systems + sore throat No cough No pleuritic pain No wheezing No nasal congestion No post-nasal drainage No sinus pain/pressure No itchy/red eyes No earache No hemoptysis No SOB No fever/chills No nausea No vomiting No abdominal pain No diarrhea No urinary symptoms No skin rash + fatigue No myalgias No headache Used OTC meds without relief   Physical Exam Triage Vital Signs ED Triage Vitals  Enc Vitals Group     BP 01/06/17 0912 113/81     Pulse Rate 01/06/17 0912 80     Resp --      Temp 01/06/17 0912 98.3 F (36.8 C)     Temp Source 01/06/17 0912 Oral     SpO2 01/06/17 0912 96 %     Weight 01/06/17 0913 (!) 312 lb (141.5 kg)     Height 01/06/17 0913 5\' 10"  (1.778 m)     Head Circumference --      Peak Flow --      Pain Score 01/06/17 0913 6     Pain Loc --      Pain Edu? --      Excl. in GC? --    No data found.  Updated Vital Signs BP 113/81 (BP Location: Right Arm)   Pulse 80   Temp 98.3 F (36.8 C) (Oral)   Ht 5\' 10"  (1.778 m)   Wt (!) 312 lb (141.5 kg)   SpO2 96%   BMI 44.77 kg/m   Visual Acuity Right Eye Distance:   Left Eye Distance:   Bilateral Distance:    Right Eye Near:   Left Eye Near:    Bilateral Near:     Physical Exam Nursing notes and Vital Signs reviewed. Appearance:  Patient appears stated age, and in no acute distress Eyes:  Pupils are equal, round, and reactive to light and accomodation.  Extraocular movement is intact.  Conjunctivae are not inflamed  Ears:  Canals normal.  Tympanic membranes normal.  Nose:  Normal turbinates.  No sinus tenderness.   Neck:  Supple. Tonsillar nodes are tender to palpation but not enlarged.  Left lateral nodes mildly tender to palpation. Lungs:  Clear to auscultation.  Breath sounds are equal.  Moving air well. Heart:  Regular rate and rhythm without murmurs, rubs, or  gallops.  Abdomen:  Nontender without masses or hepatosplenomegaly.  Bowel sounds are present.  No CVA or flank tenderness.  Extremities:  No edema.  Skin:  No rash present.    UC Treatments / Results  Labs (all labs ordered are  listed, but only abnormal results are displayed) Labs Reviewed  POCT RAPID STREP A (OFFICE) - Abnormal; Notable for the following components:      Result Value   Rapid Strep A Screen Positive (*)    All other components within normal limits    EKG  EKG Interpretation None       Radiology No results found.  Procedures Procedures (including critical care time)  Medications Ordered in UC Medications - No data to display   Initial Impression / Assessment and Plan / UC Course  I have reviewed the triage vital signs and the nursing notes.  Pertinent labs & imaging results that were available during my care of the patient were reviewed by me and considered in my medical decision making (see chart for details).    Begin Pen VK. Try warm salt water gargles for sore throat.  May take Ibuprofen 200mg , 4 tabs every 8 hours with food.  If symptoms become significantly worse during the night or over the weekend, proceed to the local emergency room.  Followup with Family Doctor if not improved in 7 to 10 days.    Final Clinical Impressions(s) / UC Diagnoses   Final diagnoses:  Streptococcal sore throat    ED Discharge Orders        Ordered    penicillin v potassium (VEETID) 500 MG tablet     01/06/17 0931          Lattie HawBeese, Vrinda Heckstall A, MD 01/06/17 [id number]

## 2017-01-06 NOTE — Discharge Instructions (Signed)
Try warm salt water gargles for sore throat.  May take Ibuprofen 200mg , 4 tabs every 8 hours with food.  If symptoms become significantly worse during the night or over the weekend, proceed to the local emergency room.

## 2017-01-06 NOTE — ED Triage Notes (Signed)
Sore throat, bi-lateral ear pain, fatigue x 4 days

## 2017-01-15 IMAGING — US US MFM OB COMP +14 WKS
1 series · 14 of 28 positions shown · non-contrast
Comparison: none

[Series 1: us mfm ob comp +14 wks · 56 acquisitions, 14 frames shown]
[im 3/56]
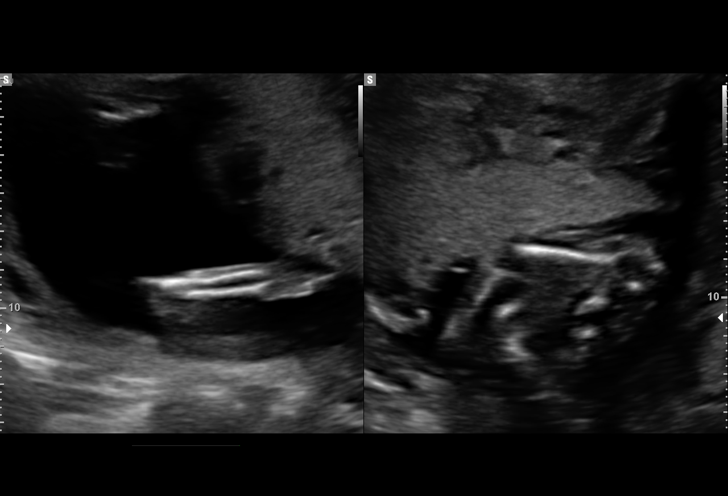
[im 7/56]
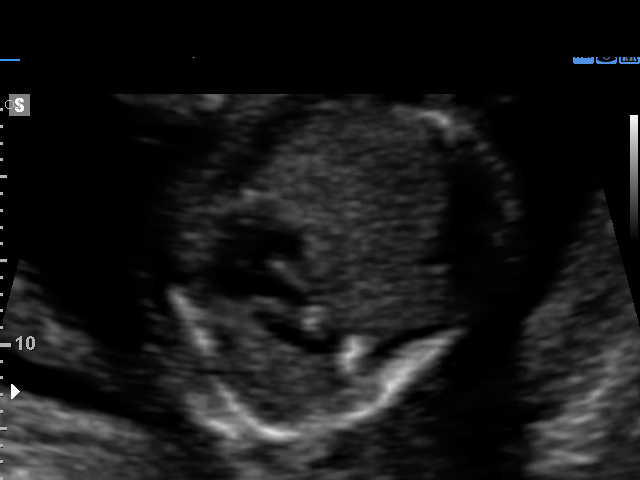
[im 11/56]
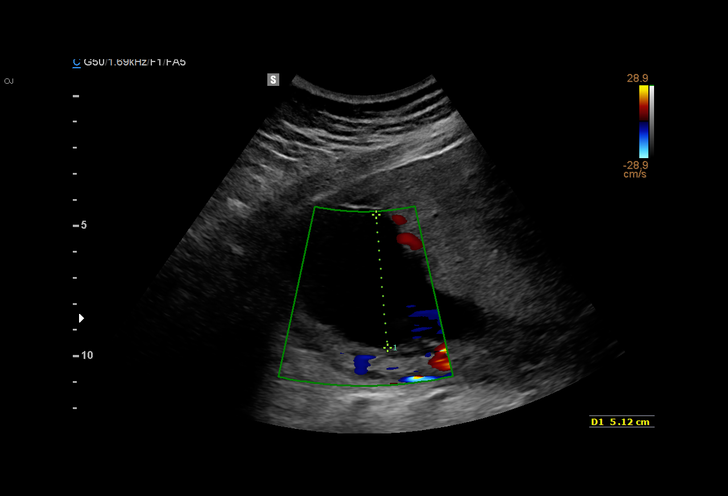
[im 15/56]
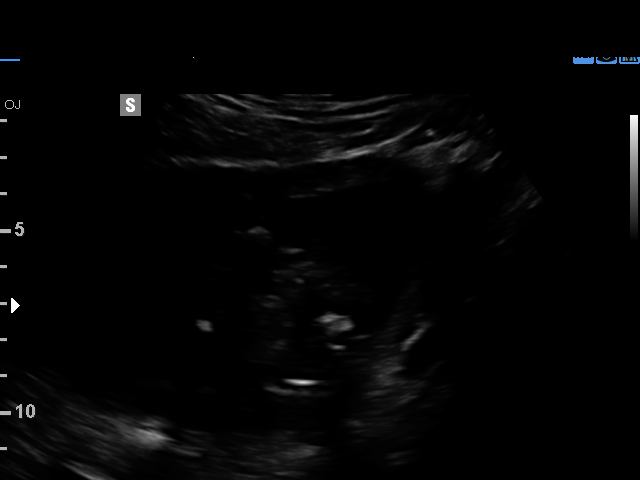
[im 19/56]
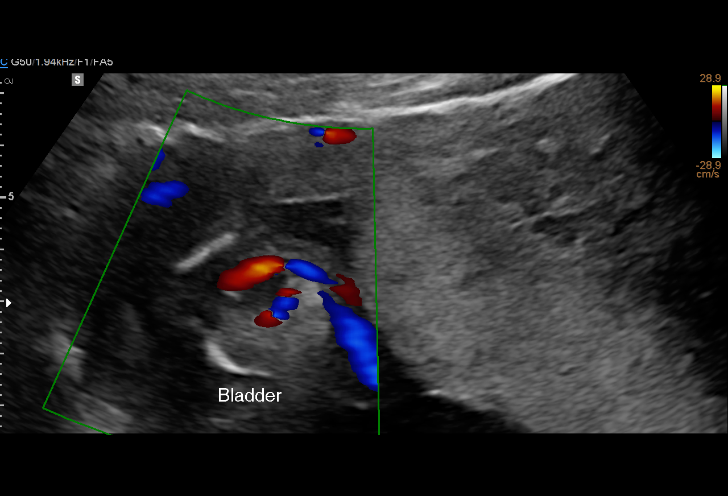
[im 23/56]
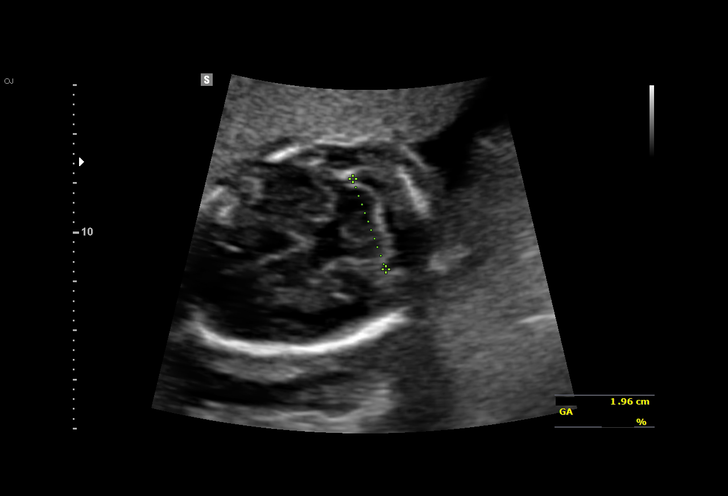
[im 27/56]
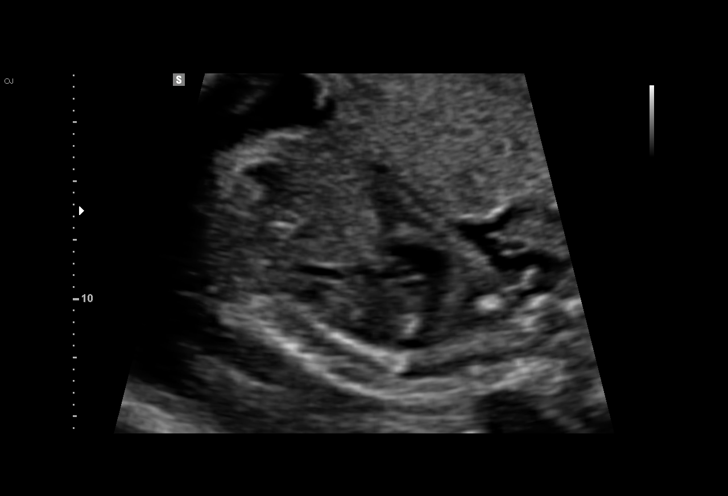
[im 31/56]
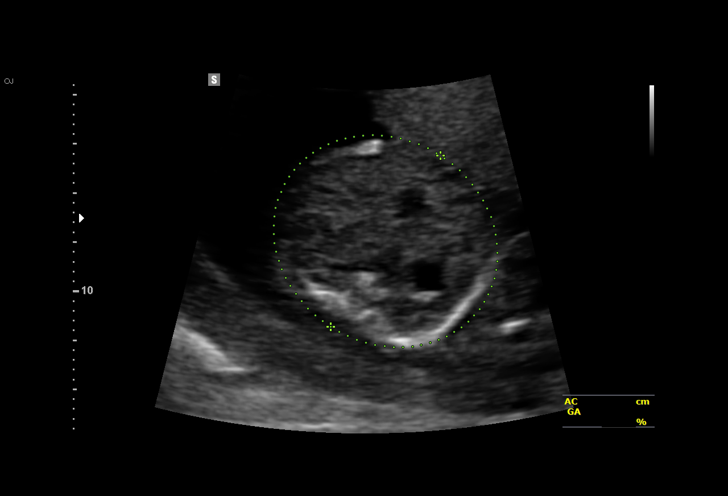
[im 35/56]
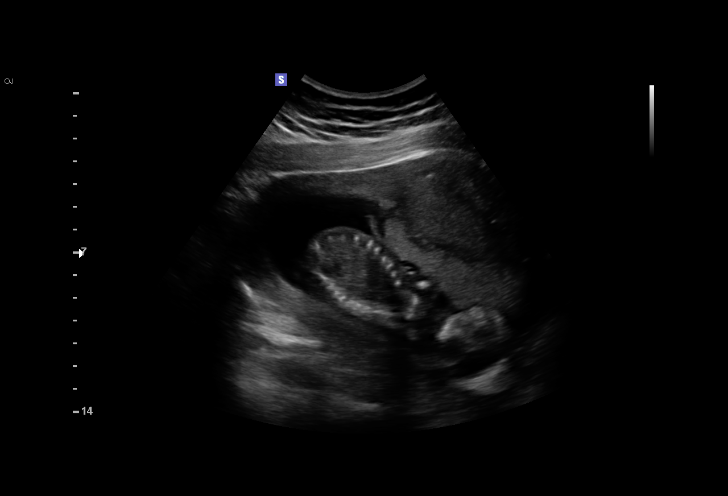
[im 39/56]
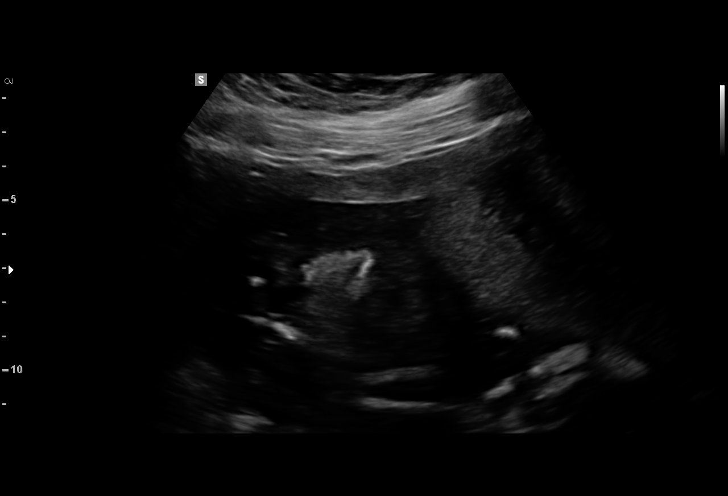
[im 43/56]
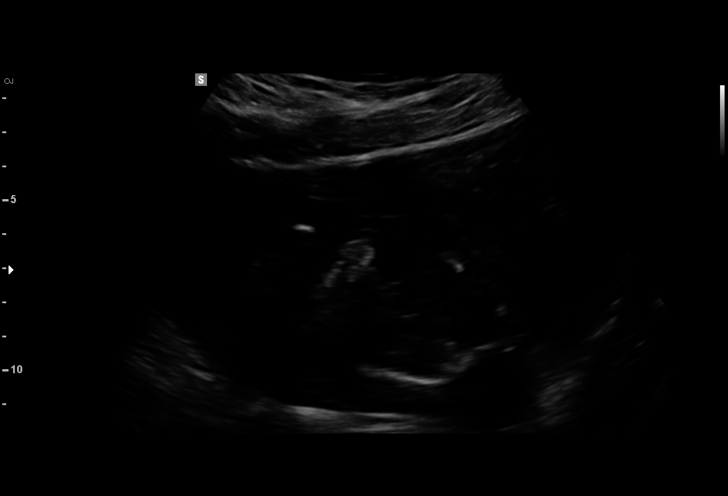
[im 47/56]
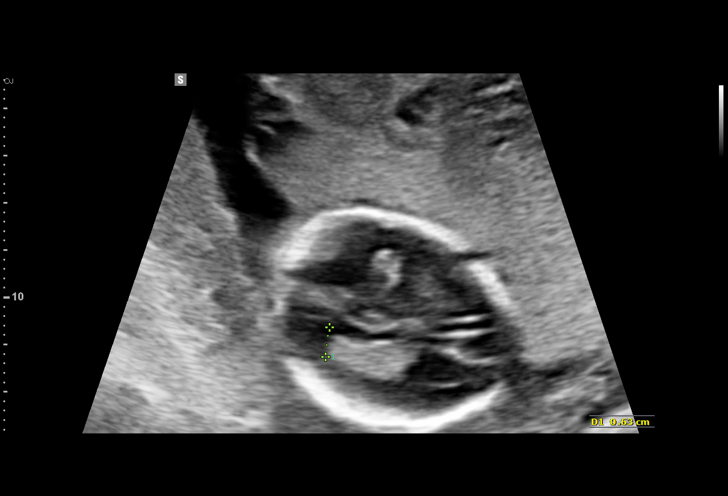
[im 51/56]
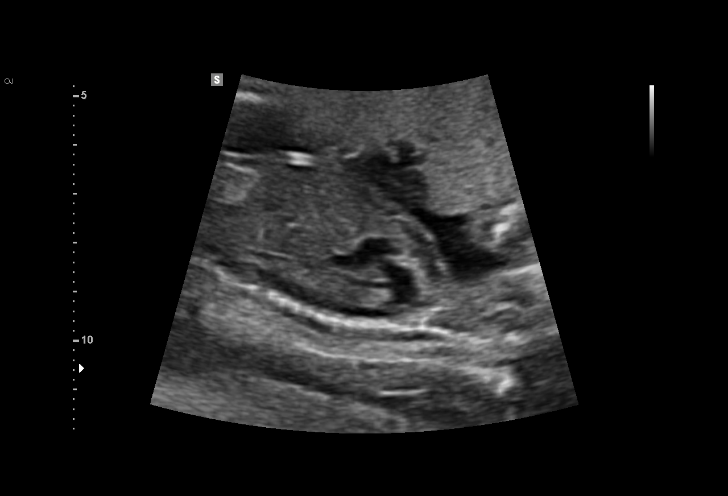
[im 56/56]
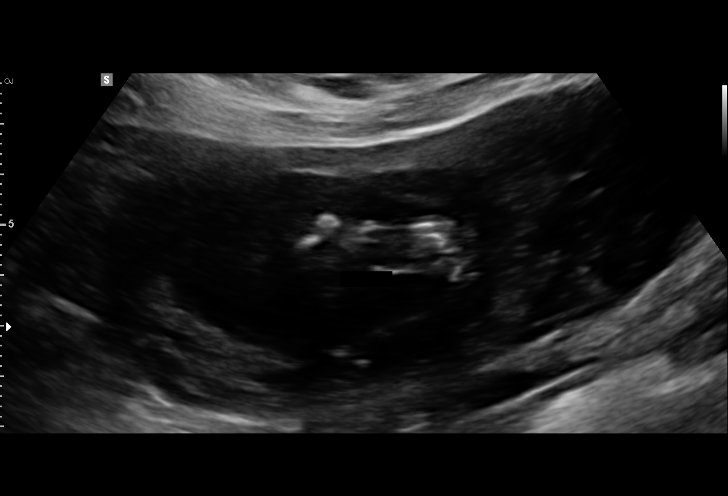

[14 of 28 positions shown; findings below may reference images not displayed]

[REDACTED]

1  CUALICION RESPUESTA RAPIDA DERIEUX            [id number]      [id number]     [id number]
Indications

19 weeks gestation of pregnancy
Basic anatomic survey                          Z36
Obesity complicating pregnancy, second
trimester
OB History

Blood Type:            Height:  5'10"  Weight (lb):  255       BMI:
Gravidity:    2         Term:   1        Prem:   0         SAB:   0
TOP:          0       Ectopic:  0        Living: 1
Fetal Evaluation

Num Of Fetuses:     1
Fetal Heart         167
Rate(bpm):
Cardiac Activity:   Observed
Presentation:       Cephalic
Placenta:           Anterior
P. Cord Insertion:  Not well visualized

Amniotic Fluid
AFI FV:      Subjectively within normal limits
Biometry

BPD:        42  mm     G. Age:  18w 5d         38  %    CI:         70.48  %    70 - 86
FL/HC:       17.1  %    16.1 -
HC:      159.5  mm     G. Age:  18w 6d         33  %    HC/AC:       1.07       1.09 -
AC:      149.2  mm     G. Age:  20w 1d         81  %    FL/BPD:      64.8  %
FL:       27.2  mm     G. Age:  18w 2d         21  %    FL/AC:       18.2  %    20 - 24
HUM:      28.9  mm     G. Age:  19w 3d         60  %
CER:      19.6  mm     G. Age:  18w 6d         44  %
Est. FW:     281   gm    0 lb 10 oz     49  %
Gestational Age

LMP:           19w 0d        Date:  03/13/15                 EDD:    12/18/15
U/S Today:     19w 0d                                        EDD:    12/18/15
Best:          19w 0d     Det. By:  LMP  (03/13/15)          EDD:    12/18/15
Anatomy

Cranium:               Appears normal         Aortic Arch:            Appears normal
Cavum:                 Not well visualized    Ductal Arch:            Appears normal
Ventricles:            Appears normal         Diaphragm:              Not well visualized
Choroid Plexus:        Appears normal         Stomach:                Appears normal, left
sided
Cerebellum:            Appears normal         Abdomen:                Appears normal
Posterior Fossa:       Appears normal         Abdominal Wall:         Not well visualized
Nuchal Fold:           Appears normal         Cord Vessels:           Appears normal (3
vessel cord)
Face:                  Not well visualized    Kidneys:                Appear normal
Lips:                  Not well visualized    Bladder:                Appears normal
Thoracic:              Appears normal         Spine:                  Not well visualized
Heart:                 Not well visualized    Upper Extremities:      Appears normal
RVOT:                  Not well visualized    Lower Extremities:      Appears normal
LVOT:                  Not well visualized

Other:  Gender not well visualized. Heels visualized. Technically difficult due
to maternal habitus and fetal position.
Cervix Uterus Adnexa

Cervix
Length:            3.6  cm.
Normal appearance by transabdominal scan.
Impression

SIUP at 19+0 weeks
Normal but limited detailed fetal anatomy; no gross
abnormalites identified
Markers of aneuploidy: none
Normal amniotic fluid volume
Measurements consistent with LMP dating
Recommendations

Follow-up ultrasound in 4-6 weeks to complete anatomy
survey

## 2017-05-22 IMAGING — US US MISC SOFT TISSUE
1 series · 7 of 7 positions shown · non-contrast
Comparison: None.

CLINICAL DATA: Soft tissue mass on lower abdominal wall, present
for "Several years"

EXAM:
SOFT TISSUE ULTRASOUND - MISCELLANEOUS
TECHNIQUE: Focused grayscale and color Doppler ultrasound imaging of the
palpable abnormality was performed.

[Series 1: us misc soft tissue · 0.04mm/px · 7 of 7 slices shown]
[im 1/7]
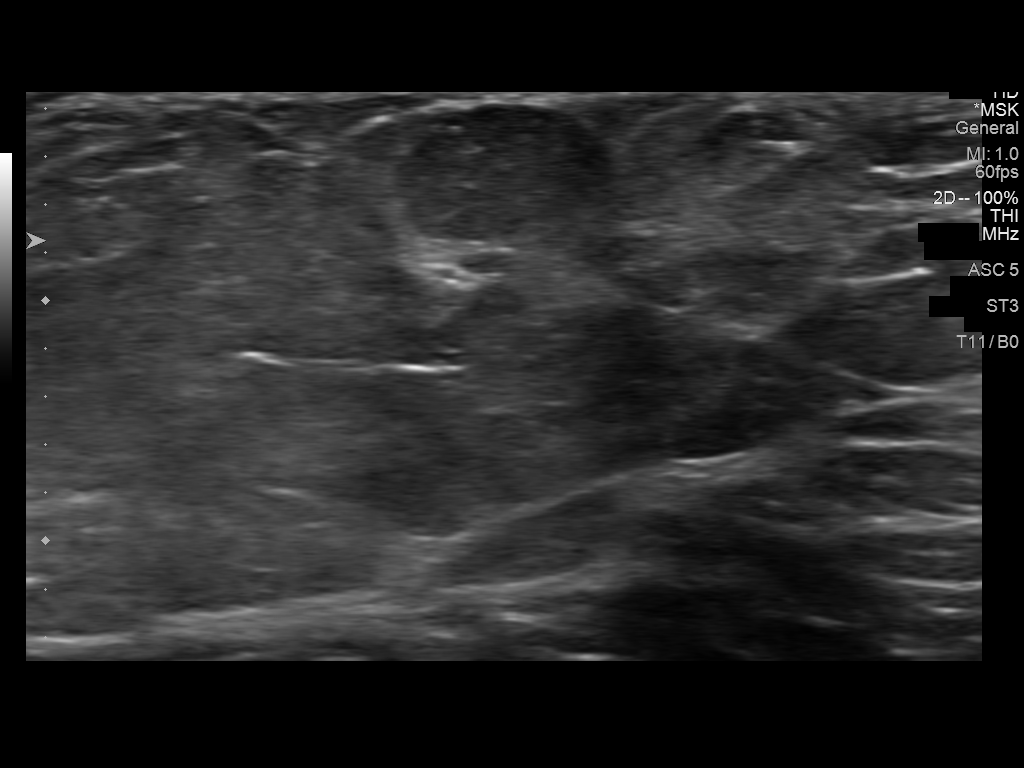
[im 2/7]
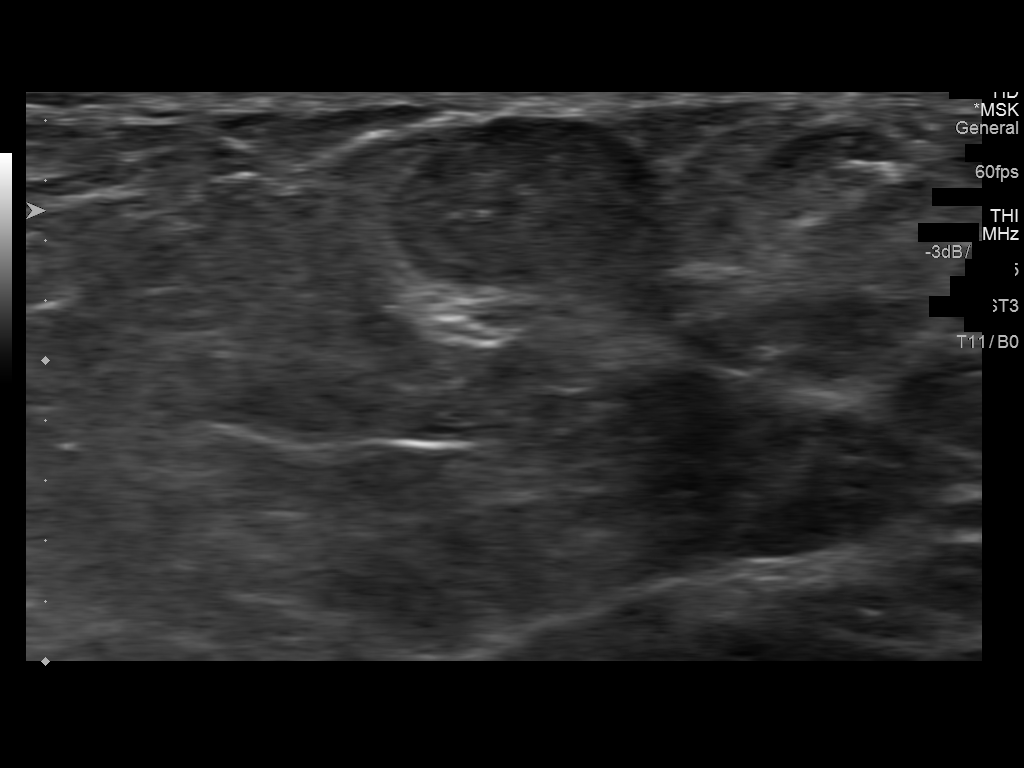
[im 3/7]
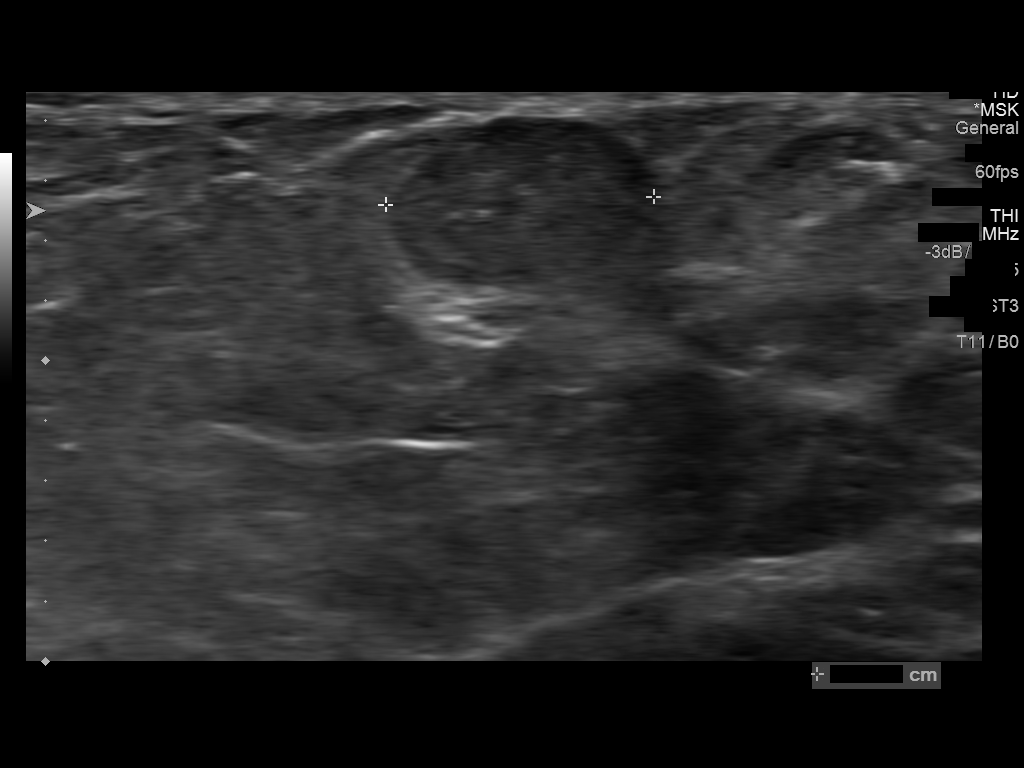
[im 4/7]
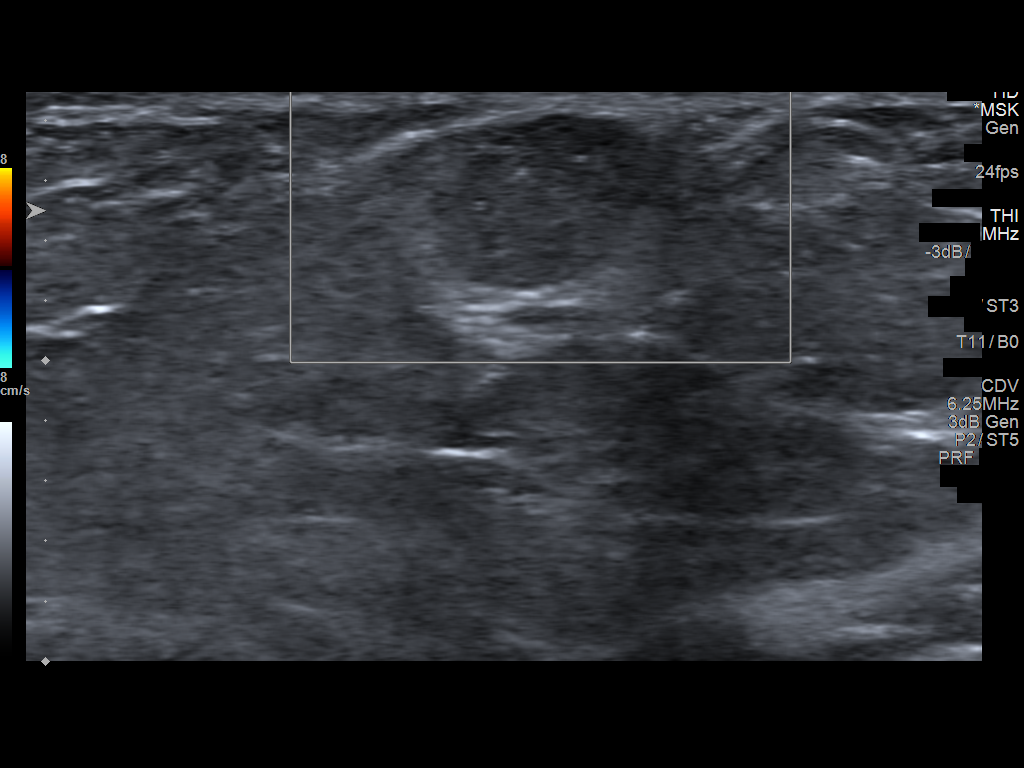
[im 5/7]
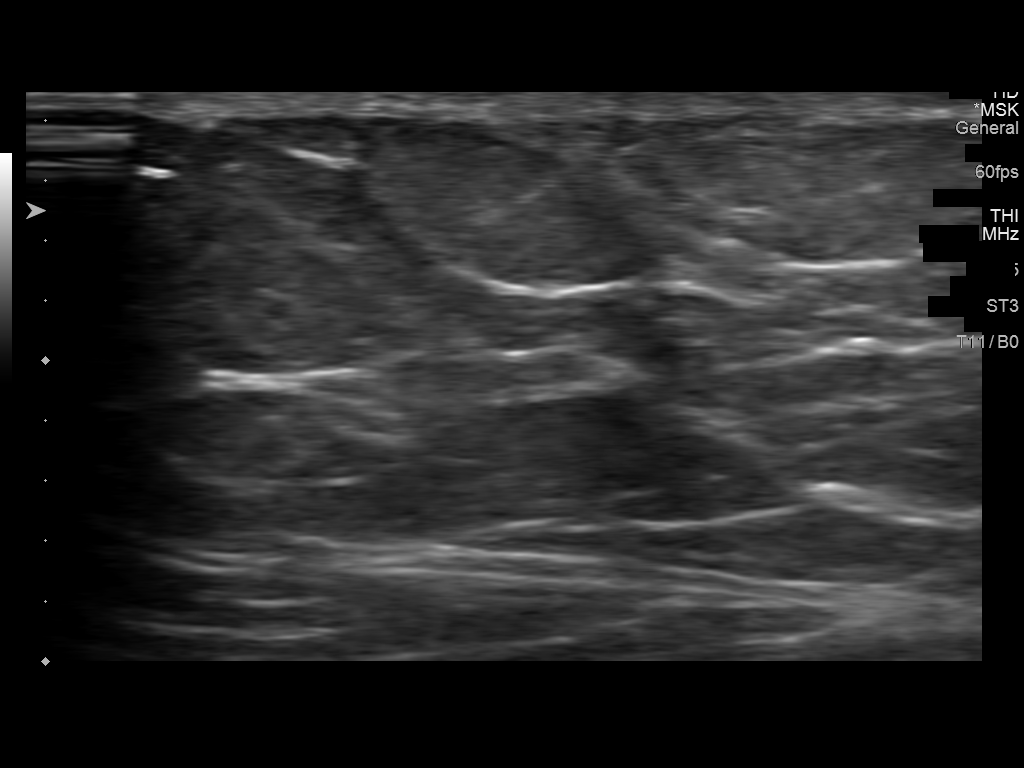
[im 6/7]
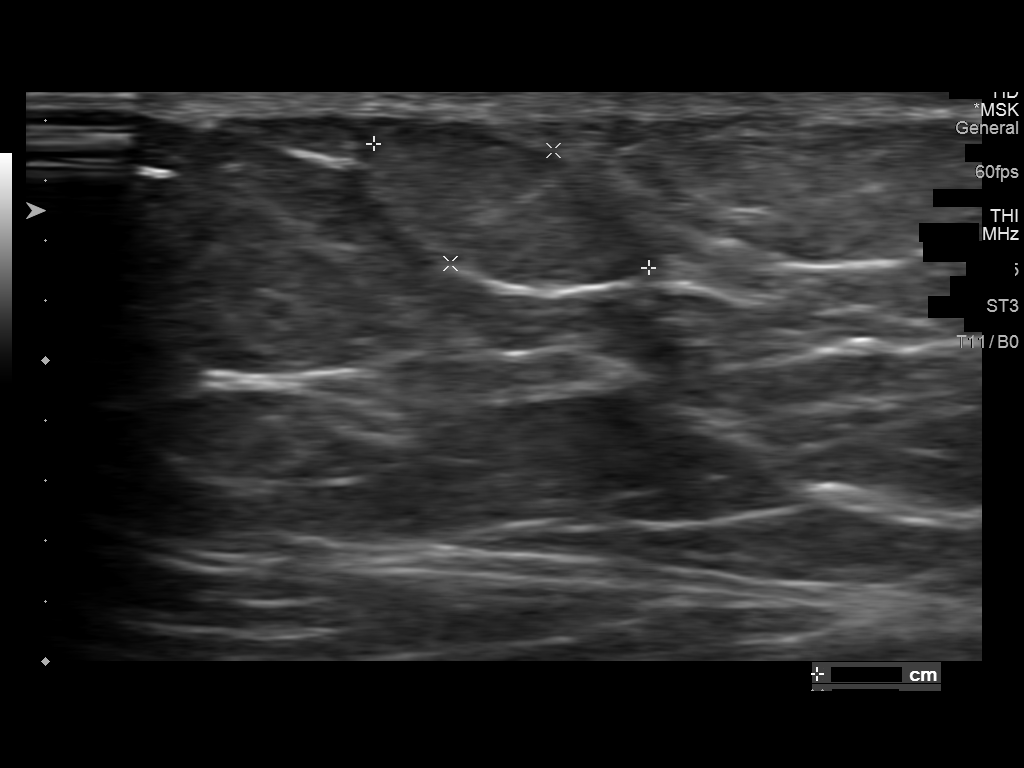
[im 7/7]
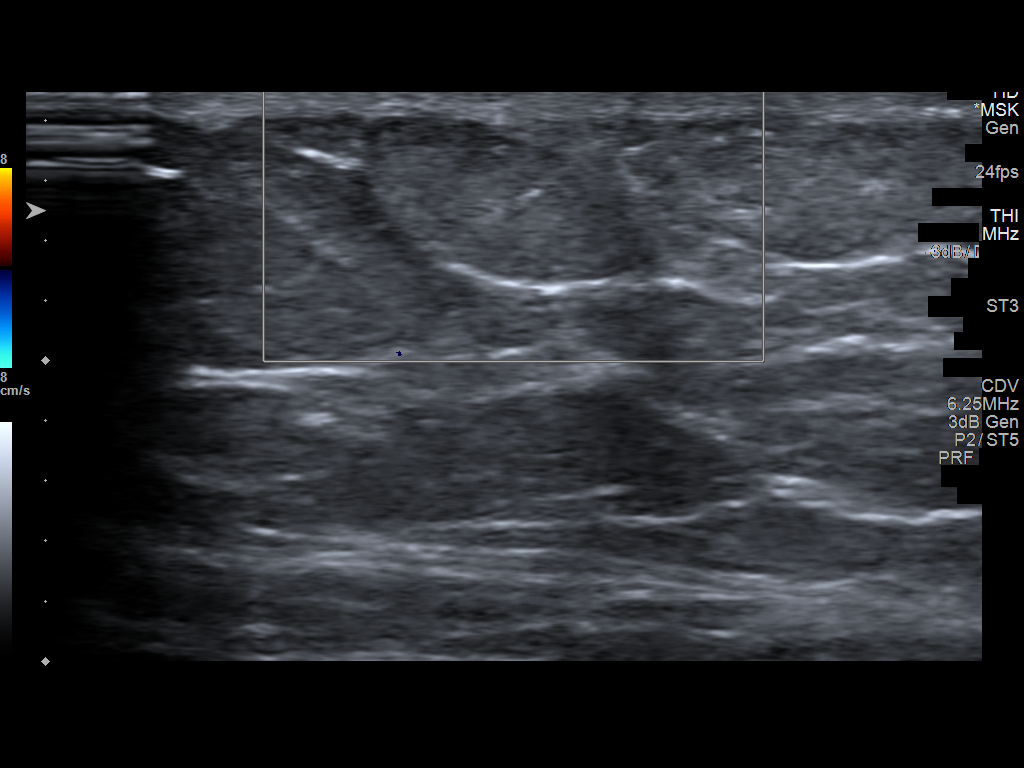

[7 of 7 positions shown; findings below may reference images not displayed]

FINDINGS: In the midline subcutaneous suprapubic abdominal wall is a
discrete/encapsulated mass measuring 1 cm. Echotexture is similar to
regional fat. Since the nodule is palpable, anticipate clinical
followup for stability.
IMPRESSION: 1 cm subcutaneous mass with features of lipoma. If enlarging,
recommend surveillance.

## 2017-05-24 ENCOUNTER — Other Ambulatory Visit: Payer: Self-pay

## 2017-05-24 DIAGNOSIS — F411 Generalized anxiety disorder: Secondary | ICD-10-CM

## 2017-05-24 MED ORDER — SERTRALINE HCL 50 MG PO TABS: 75.0000 mg | ORAL_TABLET | Freq: Every day | ORAL | 0 refills | Status: DC

## 2017-06-01 ENCOUNTER — Emergency Department
Admission: EM | Admit: 2017-06-01 | Discharge: 2017-06-01 | Disposition: A | Discharge status: Home / Self Care | Payer: 59 | Source: Home / Self Care | Attending: Family Medicine | Admitting: Family Medicine

## 2017-06-01 ENCOUNTER — Encounter: Payer: Self-pay | Admitting: Emergency Medicine

## 2017-06-01 DIAGNOSIS — R109 Unspecified abdominal pain: Secondary | ICD-10-CM | POA: Diagnosis not present

## 2017-06-01 LAB — POCT URINALYSIS DIP (MANUAL ENTRY)
Bilirubin, UA: NEGATIVE
GLUCOSE UA: NEGATIVE mg/dL
Ketones, POC UA: NEGATIVE mg/dL
Leukocytes, UA: NEGATIVE
NITRITE UA: NEGATIVE
Protein Ur, POC: NEGATIVE mg/dL
Spec Grav, UA: 1.015 (ref 1.010–1.025)
Urobilinogen, UA: 0.2 E.U./dL
pH, UA: 5.5 (ref 5.0–8.0)

## 2017-06-01 NOTE — Discharge Instructions (Addendum)
Continue increased fluid intake.  May take Tylenol or ibuprofen as needed for pain. Return for worsening symptoms or blood in urine.

## 2017-06-01 NOTE — ED Provider Notes (Signed)
[facility]    CSN: [id number] Arrival date & time: 06/01/17  0804     History   Chief Complaint Chief Complaint  Patient presents with  . Back Pain    R lower back x2 days    HPI Regina Andrews is a 32 y.o. female.   Patient developed colicky right lower back pain two days ago, and today the pain radiated to her right abdomen.  She notes that the pain has been somewhat worse after she drinks fluids.  She has mild urinary frequency but no dysuria.  Her period started about 5 days ago. She has a past history of kidney infection, and present symptoms feel similar.  However, she does not feel ill and denies fevers, chills, and sweats.  She had nausea without vomiting yesterday.  Her pain improved with Excedrin. She has a past history of cholecystectomy. She has multiple relatives who have had kidney stones:  Father, mother, 2 brothers, maternal grandmother.  The history is provided by the patient.    Past Medical History:  Diagnosis Date  . Anxiety   . Cervical cancer screening 04/10/2015   Hx abn Pap 05/2014   . 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 Family history of depression 04/10/2015   both parents   . Family history of diabetes mellitus in first degree relative 04/10/2015   mother   . Family history of heart disease 04/10/2015  . History of abdominoplasty 04/10/2015  . History of breast augmentation 04/10/2015  . History of cholecystectomy 04/10/2015  . History of goiter 04/10/2015  . History of PCOS 04/10/2015  . History of tonsillectomy 04/10/2015    Patient Active Problem List   Diagnosis Date Noted  . Pain of left thumb 08/05/2016  . Weight gain 08/05/2016  . Headache in pregnancy, antepartum 09/09/2015  . Von Willebrand disease, type IIa (HCC) 07/12/2015  . History of dysphagia 06/20/2015  . Vasovagal near syncope 06/11/2015  .  Encounter for supervision of normal pregnancy in multigravida 05/13/2015  . Generalized anxiety disorder 04/16/2015  . Anxiety state 04/10/2015  . Cervical cancer screening 04/10/2015  . History of PCOS 04/10/2015  . History of goiter 04/10/2015  . Family history of depression 04/10/2015  . Family history of diabetes mellitus in first degree relative 04/10/2015  . Family history of heart disease 04/10/2015  . History of breast augmentation 04/10/2015  . History of abdominoplasty 04/10/2015  . History of cholecystectomy 04/10/2015  . History of tonsillectomy 04/10/2015    Past Surgical History:  Procedure Laterality Date  . BREAST ENHANCEMENT SURGERY    . CHOLECYSTECTOMY    . TONSILLECTOMY    . TUMMY TUCK      OB History    Gravida  2   Para  2   Term  2   Preterm      AB      Living  2     SAB      TAB      Ectopic      Multiple      Live Births  2            Home Medications    Prior to Admission medications   Medication Sig Start Date End Date Taking? Authorizing Provider  AMBULATORY NON FORMULARY MEDICATION Medication Name: Spirulina    [provider]  Ascorbic Acid (  VITAMIN C) 100 MG tablet Take 100 mg by mouth daily.    [provider]  Calcium Carb-Cholecalciferol (CALCIUM + D3) 600-200 MG-UNIT TABS Take by mouth daily. Take 1 tablet daily    [provider]  FOLIC ACID PO Take 1 tablet by mouth daily. Pt takes a folic acid tablet daily, but does not know the strength.    [provider]  hydrOXYzine (ATARAX/VISTARIL) 25 MG tablet Take 1-2 tablets (25-50 mg total) by mouth 3 (three) times daily as needed for anxiety (insomnia). 04/10/15   Sunnie Nielsen, DO  MELATONIN PO Take by mouth.    [provider]  Multiple Vitamin (MULTIVITAMIN) tablet Take 1 tablet by mouth daily.    [provider]  penicillin v potassium (VEETID) 500 MG tablet Take one tab by mouth twice daily for 10 days  01/06/17   Lattie Haw, MD  sertraline (ZOLOFT) 50 MG tablet Take 1.5 tablets (75 mg total) by mouth daily. Pt needs an appt w/PCP for refills. 05/24/17   Sunnie Nielsen, DO  Zinc 50 MG TABS Take 50 mg by mouth daily.    [provider]    Family History Family History  Problem Relation Age of Onset  . Depression Mother   . Diabetes Mother   . Depression Father   . Hyperlipidemia Father   . Hypertension Father   . Cancer Paternal Aunt     Social History Social History   Tobacco Use  . Smoking status: Never Smoker  . Smokeless tobacco: Never Used  Substance Use Topics  . Alcohol use: Not on file  . Drug use: Not on file     Allergies   Patient has no known allergies.   Review of Systems Review of Systems  Constitutional: Positive for appetite change. Negative for activity change, chills, diaphoresis, fatigue and fever.  HENT: Negative.   Eyes: Negative.   Respiratory: Negative.   Cardiovascular: Negative.   Gastrointestinal: Positive for nausea. Negative for abdominal distention, abdominal pain and vomiting.  Genitourinary: Positive for flank pain and frequency. Negative for difficulty urinating, dysuria, hematuria, pelvic pain and urgency.  Skin: Negative.      Physical Exam Triage Vital Signs ED Triage Vitals  Enc Vitals Group     BP 06/01/17 0826 127/84     Pulse Rate 06/01/17 0826 70     Resp 06/01/17 0826 16     Temp 06/01/17 0826 98.3 F (36.8 C)     Temp Source 06/01/17 0826 Oral     SpO2 06/01/17 0826 100 %     Weight 06/01/17 0827 (!) 303 lb 8 oz (137.7 kg)     Height 06/01/17 0827  (1.778 m)     Head Circumference --      Peak Flow --      Pain Score 06/01/17 0827 4     Pain Loc --      Pain Edu? --      Excl. in GC? --    No data found.  Updated Vital Signs BP 127/84 (BP Location: Left Arm)   Pulse 70   Temp 98.3 F (36.8 C) (Oral)   Resp 16   Ht  (1.778 m)   Wt (!) 303 lb 8 oz (137.7 kg)   LMP  05/29/2017   SpO2 100%   BMI 43.55 kg/m   Visual Acuity Right Eye Distance:   Left Eye Distance:   Bilateral Distance:    Right Eye Near:  Left Eye Near:    Bilateral Near:     Physical Exam  Constitutional: She appears well-developed and well-nourished. No distress.  HENT:  Head: Normocephalic.  Right Ear: External ear normal.  Left Ear: External ear normal.  Nose: Nose normal.  Mouth/Throat: Oropharynx is clear and moist.  Eyes: Pupils are equal, round, and reactive to light. Conjunctivae are normal.  Neck: Neck supple.  Pulmonary/Chest: Breath sounds normal.  Abdominal: Bowel sounds are normal. There is no tenderness.  Musculoskeletal: She exhibits no edema.  Lymphadenopathy:    She has no cervical adenopathy.  Neurological: She is alert.  Skin: Skin is warm. No rash noted.     Mild right flank tenderness as noted on diagram.   Nursing note and vitals reviewed.    UC Treatments / Results  Labs (all labs ordered are listed, but only abnormal results are displayed) Labs Reviewed  POCT URINALYSIS DIP (MANUAL ENTRY) - Abnormal; Notable for the following components:      Result Value   Blood, UA trace-lysed (*)    All other components within normal limits  URINE CULTURE    EKG None  Radiology No results found.  Procedures Procedures (including critical care time)  Medications Ordered in UC Medications - No data to display  Initial Impression / Assessment and Plan / UC Course  I have reviewed the triage vital signs and the nursing notes.  Pertinent labs & imaging results that were available during my care of the patient were reviewed by me and considered in my medical decision making (see chart for details).    No evidence UTI today.  Note significant family history of nephrolithiasis. Urinalysis unremarkable.  Will order urine culture. Followup with Family Doctor if not improved in one week.    Final Clinical Impressions(s) / UC Diagnoses    Final diagnoses:  Acute right flank pain     Discharge Instructions     Continue increased fluid intake.  May take Tylenol or ibuprofen as needed for pain. Return for worsening symptoms or blood in urine.    ED Prescriptions    None        Lattie Haw, MD 06/03/17 1216

## 2017-06-01 NOTE — ED Triage Notes (Signed)
32 y.o female presents with right sided low back pain. States she's had urinary frequency and feels like she is unable to empty completely. The pain comes and goes and she is experiencing lower abdomen cramping however she is on her cycle.

## 2017-06-02 LAB — URINE CULTURE
MICRO NUMBER:: 90555374
Result:: NO GROWTH
SPECIMEN QUALITY:: ADEQUATE

## 2017-06-07 ENCOUNTER — Ambulatory Visit (INDEPENDENT_AMBULATORY_CARE_PROVIDER_SITE_OTHER): Payer: 59 | Admitting: Obstetrics & Gynecology

## 2017-06-07 ENCOUNTER — Other Ambulatory Visit (HOSPITAL_COMMUNITY)
Admission: RE | Admit: 2017-06-07 | Discharge: 2017-06-07 | Disposition: A | Discharge status: Home / Self Care | Payer: 59 | Source: Ambulatory Visit | Attending: Obstetrics & Gynecology | Admitting: Obstetrics & Gynecology

## 2017-06-07 ENCOUNTER — Encounter: Payer: Self-pay | Admitting: Obstetrics & Gynecology

## 2017-06-07 VITALS — BP 124/82 | HR 79 | Ht 70.0 in | Wt 325.0 lb

## 2017-06-07 DIAGNOSIS — Z01419 Encounter for gynecological examination (general) (routine) without abnormal findings: Secondary | ICD-10-CM | POA: Insufficient documentation

## 2017-06-07 DIAGNOSIS — R3914 Feeling of incomplete bladder emptying: Secondary | ICD-10-CM | POA: Diagnosis not present

## 2017-06-07 NOTE — Progress Notes (Signed)
Last pap 05/13/15- normal results

## 2017-06-07 NOTE — Progress Notes (Signed)
Subjective:    Regina Andrews is a 32 y.o. married P2 (63 yo son and 53 month old daughter) female who presents for an annual exam. The patient has no complaints today. She feels urgency and frequency. But when she sits on the toilet, sometimes it takes a while to start voiding, also feels like her bladder never completely empties. The patient is sexually active, but very rarely- like once per year. GYN screening history: last pap: was normal. The patient wears seatbelts: yes. The patient participates in regular exercise: no. Has the patient ever been transfused or tattooed?: yes. The patient reports that there is not domestic violence in her life.   Menstrual History: OB History    Gravida  2   Para  2   Term  2   Preterm      AB      Living  2     SAB      TAB      Ectopic      Multiple      Live Births  2           Menarche age: 33 Patient's last menstrual period was 05/29/2017.    The following portions of the patient's history were reviewed and updated as appropriate: allergies, current medications, past family history, past medical history, past social history, past surgical history and problem list.  Review of Systems Pertinent items are noted in HPI.   FH- no breast/gyn/colon cancer Married for 8 years Some discomfort with sex since delivery of her son, deep, some external discomfort Homemaker Doesn't use contraception Has a period monthly    Objective:    BP 124/82   Pulse 79   Ht  (1.778 m)   Wt (!) 325 lb (147.4 kg)   LMP 05/29/2017   Breastfeeding? No   BMI 46.63 kg/m   General Appearance:    Alert, cooperative, no distress, appears stated age  Head:    Normocephalic, without obvious abnormality, atraumatic  Eyes:    PERRL, conjunctiva/corneas clear, EOM's intact, fundi    benign, both eyes  Ears:    Normal TM's and external ear canals, both ears  Nose:   Nares normal, septum midline, mucosa normal, no drainage    or sinus tenderness   Throat:   Lips, mucosa, and tongue normal; teeth and gums normal  Neck:   Supple, symmetrical, trachea midline, no adenopathy;    thyroid:  no enlargement/tenderness/nodules; no carotid   bruit or JVD  Back:     Symmetric, no curvature, ROM normal, no CVA tenderness  Lungs:     Clear to auscultation bilaterally, respirations unlabored  Chest Wall:    No tenderness or deformity   Heart:    Regular rate and rhythm, S1 and S2 normal, no murmur, rub   or gallop  Breast Exam:    No tenderness, masses, or nipple abnormality  Abdomen:     Soft, non-tender, bowel sounds active all four quadrants,    no masses, no organomegaly  Genitalia:    Normal female without lesion, discharge or tenderness, normal size and shape, anteverted, mobile, non-tender, normal adnexal exam      Extremities:   Extremities normal, atraumatic, no cyanosis or edema  Pulses:   2+ and symmetric all extremities  Skin:   Skin color, texture, turgor normal, no rashes or lesions  Lymph nodes:   Cervical, supraclavicular, and axillary nodes normal  Neurologic:   CNII-XII intact, normal strength, sensation and reflexes  throughout  .    Assessment:    Healthy female exam.   Incomplete emptying Morbid obesity   Plan:     Thin prep Pap smear. with cotesting Refer to bariatrics Refer to urology

## 2017-06-09 LAB — CYTOLOGY - PAP
DIAGNOSIS: UNDETERMINED — AB
HPV: NOT DETECTED

## 2017-07-17 ENCOUNTER — Other Ambulatory Visit: Payer: Self-pay | Admitting: Osteopathic Medicine

## 2017-07-17 DIAGNOSIS — F411 Generalized anxiety disorder: Secondary | ICD-10-CM

## 2017-10-06 ENCOUNTER — Other Ambulatory Visit: Payer: Self-pay | Admitting: Osteopathic Medicine

## 2017-10-06 DIAGNOSIS — F411 Generalized anxiety disorder: Secondary | ICD-10-CM

## 2017-10-11 ENCOUNTER — Encounter: Payer: Self-pay | Admitting: Osteopathic Medicine

## 2017-10-11 ENCOUNTER — Ambulatory Visit: Payer: 59 | Admitting: Osteopathic Medicine

## 2017-10-11 VITALS — BP 113/78 | HR 76 | Temp 98.3°F | Wt 318.7 lb

## 2017-10-11 DIAGNOSIS — Z8742 Personal history of other diseases of the female genital tract: Secondary | ICD-10-CM | POA: Diagnosis not present

## 2017-10-11 DIAGNOSIS — R5383 Other fatigue: Secondary | ICD-10-CM

## 2017-10-11 DIAGNOSIS — F411 Generalized anxiety disorder: Secondary | ICD-10-CM

## 2017-10-11 DIAGNOSIS — Z6841 Body Mass Index (BMI) 40.0 and over, adult: Secondary | ICD-10-CM | POA: Diagnosis not present

## 2017-10-11 DIAGNOSIS — Z8639 Personal history of other endocrine, nutritional and metabolic disease: Secondary | ICD-10-CM

## 2017-10-11 MED ORDER — SERTRALINE HCL 50 MG PO TABS: 75.0000 mg | ORAL_TABLET | Freq: Every day | ORAL | 1 refills | Status: DC

## 2017-10-11 NOTE — Progress Notes (Signed)
HPI: Regina Andrews is a 32 y.o. female who  has a past medical history of Anxiety, Cervical cancer screening (04/10/2015), Cervical cancer screening (04/10/2015), Family history of depression (04/10/2015), Family history of diabetes mellitus in first degree relative (04/10/2015), Family history of heart disease (04/10/2015), History of abdominoplasty (04/10/2015), History of breast augmentation (04/10/2015), History of cholecystectomy (04/10/2015), History of goiter (04/10/2015), History of PCOS (04/10/2015), and History of tonsillectomy (04/10/2015).  [facility], 10/11/17,  for chief complaint of:  Fatigue  About a month of feeling more tired, worse over last 2 weeks. Feels like if she doesn't eat, she feels extremely tired, somtiems feels "funny" after eating sometimes. She is worried about (+)FH DM2 in mom and grandmother.      Past medical history, surgical history, and family history reviewed.  Current medication list and allergy/intolerance information reviewed.   (See remainder of HPI, ROS, Phys Exam below)    ASSESSMENT/PLAN: The primary encounter diagnosis was Fatigue, unspecified type. Diagnoses of Generalized anxiety disorder, BMI 45.0-49.9, adult (HCC), History of PCOS, and History of goiter were also pertinent to this visit.  Orders Placed This Encounter  Procedures  . COMPLETE METABOLIC PANEL WITH GFR  . Lipid panel  . TSH  . T4, free  . Hemoglobin A1c  . VITAMIN D 25 Hydroxy (Vit-D Deficiency, Fractures)  . CBC with Differential/Platelet     Patient Instructions  Plan:  Let's get some blood work to rule out serious abnormality  Work on healthy foods in small portions spaced out throughout the day - fasting doesn't work for everyone!    Follow-up plan: Return if symptoms worsen or fail to  improve.                           ############################################ ############################################ ############################################ ############################################    Outpatient Encounter Medications as of 10/11/2017  Medication Sig  . AMBULATORY NON FORMULARY MEDICATION Medication Name: Spirulina  . Ascorbic Acid (VITAMIN C) 100 MG tablet Take 100 mg by mouth daily.  . Calcium Carb-Cholecalciferol (CALCIUM + D3) 600-200 MG-UNIT TABS Take by mouth daily. Take 1 tablet daily  . FOLIC ACID PO Take 1 tablet by mouth daily. Pt takes a folic acid tablet daily, but does not know the strength.  . hydrOXYzine (ATARAX/VISTARIL) 25 MG tablet Take 1-2 tablets (25-50 mg total) by mouth 3 (three) times daily as needed for anxiety (insomnia).  . MELATONIN PO Take by mouth.  . Multiple Vitamin (MULTIVITAMIN) tablet Take 1 tablet by mouth daily.  . penicillin v potassium (VEETID) 500 MG tablet Take one tab by mouth twice daily for 10 days  . sertraline (ZOLOFT) 50 MG tablet Take 1.5 tablets (75 mg total) by mouth daily. No refills. Pt due for annual visit in 07/2017 w/PCP.  [name]. Zinc 50 MG TABS Take 50 mg by mouth daily.   No facility-administered encounter medications on file as of 10/11/2017.    No Known Allergies    Review of Systems:  Constitutional: No recent illness, +allergies symptoms lately   HEENT: No  headache, no vision change, +sinus congestion   Cardiac: No  chest pain, No  pressure, No palpitations  Respiratory:  No  shortness of breath. No  Cough  Gastrointestinal: No  abdominal pain, no change on bowel habits  Musculoskeletal: No new myalgia/arthralgia  Skin: No  Rash  Hem/Onc: No  easy bruising/bleeding, No  abnormal lumps/bumps  Neurologic: +episodes of general  weakness, No  Dizziness  Psychiatric: No  concerns with depression, No  concerns with anxiety, +concentration problems   Exam:  BP  113/78 (BP Location: Left Arm, Patient Position: Sitting, Cuff Size: Large)   Pulse 76   Temp 98.3 F (36.8 C) (Oral)   Wt (!) 318 lb 11.2 oz (144.6 kg)   BMI 45.73 kg/m   Constitutional: VS see above. General Appearance: alert, well-developed, well-nourished, NAD  Eyes: Normal lids and conjunctive, non-icteric sclera  Ears, Nose, Mouth, Throat: MMM, Normal external inspection ears/nares/mouth/lips/gums. No appreciable goiter or thyroid mass   Neck: No masses, trachea midline.   Respiratory: Normal respiratory effort. no wheeze, no rhonchi, no rales  Cardiovascular: S1/S2 normal, no murmur, no rub/gallop auscultated. RRR.   Musculoskeletal: Gait normal. Symmetric and independent movement of all extremities  Neurological: Normal balance/coordination. No tremor.  Skin: warm, dry, intact.   Psychiatric: Normal judgment/insight. Normal mood and affect. Oriented x3.   Visit summary with medication list and pertinent instructions was printed for patient to review, advised to alert [place] if any changes needed. All questions at time of visit were answered - patient instructed to contact office with any additional concerns. ER/RTC precautions were reviewed with the patient and understanding verbalized.   Follow-up plan: Return if symptoms worsen or fail to improve.    Please note: voice recognition software was used to produce this document, and typos may escape review. Please contact Dr. Lyn Hollingshead for any needed clarifications.

## 2017-10-11 NOTE — Patient Instructions (Signed)
Plan:  Let's get some blood work to rule out serious abnormality  Work on healthy foods in small portions spaced out throughout the day - fasting doesn't work for everyone!

## 2017-10-12 LAB — HEMOGLOBIN A1C
HEMOGLOBIN A1C: 5.5 %{Hb} (ref <5.7)
Mean Plasma Glucose: 111 (calc)
eAG (mmol/L): 6.2 (calc)

## 2017-10-12 LAB — COMPLETE METABOLIC PANEL WITH GFR
AG Ratio: 1.8 (calc) (ref 1.0–2.5)
ALT: 17 U/L (ref 6–29)
AST: 14 U/L (ref 10–30)
Albumin: 4.6 g/dL (ref 3.6–5.1)
Alkaline phosphatase (APISO): 88 U/L (ref 33–115)
BUN: 10 mg/dL (ref 7–25)
CO2: 28 mmol/L (ref 20–32)
Calcium: 9.7 mg/dL (ref 8.6–10.2)
Chloride: 101 mmol/L (ref 98–110)
Creat: 0.58 mg/dL (ref 0.50–1.10)
GFR, Est African American: 139 mL/min/{1.73_m2} (ref >=60)
GFR, Est Non African American: 120 mL/min/{1.73_m2} (ref >=60)
GLUCOSE: 99 mg/dL (ref 65–99)
Globulin: 2.6 g/dL (calc) (ref 1.9–3.7)
Potassium: 4.3 mmol/L (ref 3.5–5.3)
SODIUM: 139 mmol/L (ref 135–146)
Total Bilirubin: 0.6 mg/dL (ref 0.2–1.2)
Total Protein: 7.2 g/dL (ref 6.1–8.1)

## 2017-10-12 LAB — CBC WITH DIFFERENTIAL/PLATELET
BASOS ABS: 57 {cells}/uL (ref 0–200)
Basophils Relative: 0.8 %
EOS ABS: 263 {cells}/uL (ref 15–500)
Eosinophils Relative: 3.7 %
HEMATOCRIT: 39.6 % (ref 35.0–45.0)
Hemoglobin: 13.3 g/dL (ref 11.7–15.5)
Lymphs Abs: 1740 cells/uL (ref 850–3900)
MCH: 29.9 pg (ref 27.0–33.0)
MCHC: 33.6 g/dL (ref 32.0–36.0)
MCV: 89 fL (ref 80.0–100.0)
MPV: 9.9 fL (ref 7.5–12.5)
Monocytes Relative: 7.2 %
NEUTROS PCT: 63.8 %
Neutro Abs: 4530 cells/uL (ref 1500–7800)
PLATELETS: 303 10*3/uL (ref 140–400)
RBC: 4.45 10*6/uL (ref 3.80–5.10)
RDW: 12.5 % (ref 11.0–15.0)
Total Lymphocyte: 24.5 %
WBC: 7.1 10*3/uL (ref 3.8–10.8)
WBCMIX: 511 {cells}/uL (ref 200–950)

## 2017-10-12 LAB — VITAMIN D 25 HYDROXY (VIT D DEFICIENCY, FRACTURES): Vit D, 25-Hydroxy: 29 ng/mL — ABNORMAL LOW (ref 30–100)

## 2017-10-12 LAB — LIPID PANEL
Cholesterol: 174 mg/dL (ref <200)
HDL: 52 mg/dL (ref >50)
LDL Cholesterol (Calc): 102 mg/dL (calc) — ABNORMAL HIGH
NON-HDL CHOLESTEROL (CALC): 122 mg/dL (ref <130)
TRIGLYCERIDES: 106 mg/dL (ref <150)
Total CHOL/HDL Ratio: 3.3 (calc) (ref <5.0)

## 2017-10-12 LAB — TSH: TSH: 1.15 mIU/L

## 2017-10-12 LAB — T4, FREE: Free T4: 0.8 ng/dL (ref 0.8–1.8)

## 2017-10-22 ENCOUNTER — Other Ambulatory Visit: Payer: Self-pay | Admitting: Osteopathic Medicine

## 2017-10-22 DIAGNOSIS — F411 Generalized anxiety disorder: Secondary | ICD-10-CM

## 2017-10-25 NOTE — Telephone Encounter (Signed)
At pt's request - cancelled previous rx sent to [facility] by provider. Med refill sent to [facility].

## 2018-01-10 ENCOUNTER — Emergency Department (INDEPENDENT_AMBULATORY_CARE_PROVIDER_SITE_OTHER)
Admission: EM | Admit: 2018-01-10 | Discharge: 2018-01-10 | Disposition: A | Discharge status: Home / Self Care | Payer: 59 | Source: Home / Self Care | Attending: Family Medicine | Admitting: Family Medicine

## 2018-01-10 ENCOUNTER — Other Ambulatory Visit: Payer: Self-pay

## 2018-01-10 ENCOUNTER — Emergency Department (INDEPENDENT_AMBULATORY_CARE_PROVIDER_SITE_OTHER): Payer: 59

## 2018-01-10 DIAGNOSIS — R05 Cough: Secondary | ICD-10-CM

## 2018-01-10 DIAGNOSIS — J209 Acute bronchitis, unspecified: Secondary | ICD-10-CM | POA: Diagnosis not present

## 2018-01-10 MED ORDER — BENZONATATE 200 MG PO CAPS: ORAL_CAPSULE | ORAL | 0 refills | Status: DC

## 2018-01-10 MED ORDER — DOXYCYCLINE HYCLATE 100 MG PO CAPS: 100.0000 mg | ORAL_CAPSULE | Freq: Two times a day (BID) | ORAL | 0 refills | Status: DC

## 2018-01-10 MED ORDER — PREDNISONE 20 MG PO TABS: ORAL_TABLET | ORAL | 0 refills | Status: DC

## 2018-01-10 NOTE — ED Triage Notes (Signed)
Pt presents today with c/o coughing for 1 week. She has been taking otc pseudoephedrine without any improvement. Mrs. Howley states her son was diagnosed with pneumonia a couple weeks ago. She has been afebrile but c/o of mild discomfort due to coughing constantly. Her daughter and her husband accompany her today.

## 2018-01-10 NOTE — Discharge Instructions (Signed)
Take plain guaifenesin (1200mg  extended release tabs such as Mucinex) twice daily, with plenty of water, for cough and congestion.  May add Pseudoephedrine (30mg , one or two every 4 to 6 hours) for sinus congestion.  Get adequate rest.   Try warm salt water gargles for sore throat.  Stop all antihistamines for now, and other non-prescription cough/cold preparations. May take Delsym Cough Suppressant with Tessalon at bedtime for nighttime cough.

## 2018-01-10 NOTE — ED Provider Notes (Signed)
[facility]    CSN: [id number] Arrival date & time: 01/10/18  0801     History   Chief Complaint Chief Complaint  Patient presents with  . Cough    HPI Regina Andrews is a 32 y.o. female.   Two weeks ago patient developed typical cold-like symptoms developing over several days, including sinus congestion, headache, fatigue, and cough.  She later developed a sore throat when coughing.  Her cough has persisted, and during the past 2 to 3 days she has developed wheezing at night, night sweats, and pleuritic pain in her left lateral chest. She denies history of asthma, but has 2 brothers with asthma.  The history is provided by the patient and the spouse.    Past Medical History:  Diagnosis Date  . Anxiety   . Cervical cancer screening 04/10/2015   Hx abn Pap 05/2014   . 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 Family history of depression 04/10/2015   both parents   . Family history of diabetes mellitus in first degree relative 04/10/2015   mother   . Family history of heart disease 04/10/2015  . History of abdominoplasty 04/10/2015  . History of breast augmentation 04/10/2015  . History of cholecystectomy 04/10/2015  . History of goiter 04/10/2015  . History of PCOS 04/10/2015  . History of tonsillectomy 04/10/2015    Patient Active Problem List   Diagnosis Date Noted  . Pain of left thumb 08/05/2016  . Weight gain 08/05/2016  . Headache in pregnancy, antepartum 09/09/2015  . Von Willebrand disease, type IIa (HCC) 07/12/2015  . History of dysphagia 06/20/2015  . Vasovagal near syncope 06/11/2015  . Encounter for supervision of normal pregnancy in multigravida 05/13/2015  . Generalized anxiety disorder 04/16/2015  . Anxiety state 04/10/2015  . Cervical cancer screening 04/10/2015  . History of PCOS 04/10/2015  . History of goiter  04/10/2015  . Family history of depression 04/10/2015  . Family history of diabetes mellitus in first degree relative 04/10/2015  . Family history of heart disease 04/10/2015  . History of breast augmentation 04/10/2015  . History of abdominoplasty 04/10/2015  . History of cholecystectomy 04/10/2015  . History of tonsillectomy 04/10/2015    Past Surgical History:  Procedure Laterality Date  . BREAST ENHANCEMENT SURGERY    . CHOLECYSTECTOMY    . TONSILLECTOMY    . TUMMY TUCK      OB History    Gravida  2   Para  2   Term  2   Preterm      AB      Living  2     SAB      TAB      Ectopic      Multiple      Live Births  2            Home Medications    Prior to Admission medications   Medication Sig Start Date End Date Taking? Authorizing Provider  AMBULATORY NON FORMULARY MEDICATION Medication Name: Spirulina    [provider]  Ascorbic Acid (VITAMIN C) 100 MG tablet Take 100 mg by mouth daily.    [provider]  benzonatate (TESSALON) 200 MG capsule Take one cap by mouth at bedtime as needed for cough.  May repeat in 4 to 6 hours 01/10/18   Lattie Haw, MD  Calcium Carb-Cholecalciferol (CALCIUM + D3) 600-200 MG-UNIT TABS Take by mouth daily. Take 1 tablet daily    [provider]  doxycycline (VIBRAMYCIN) 100 MG capsule Take 1 capsule (100 mg total) by mouth 2 (two) times daily. Take with food. 01/10/18   Lattie Haw, MD  FOLIC ACID PO Take 1 tablet by mouth daily. Pt takes a folic acid tablet daily, but does not know the strength.    [provider]  hydrOXYzine (ATARAX/VISTARIL) 25 MG tablet Take 1-2 tablets (25-50 mg total) by mouth 3 (three) times daily as needed for anxiety (insomnia). 04/10/15   Sunnie Nielsen, DO  MELATONIN PO Take by mouth.    [provider]  Multiple Vitamin (MULTIVITAMIN) tablet Take 1 tablet by mouth daily.    [provider]  predniSONE (DELTASONE) 20 MG tablet  Take one tab by mouth twice daily for 4 days, then one daily for 3 days. Take with food. 01/10/18   Lattie Haw, MD  sertraline (ZOLOFT) 50 MG tablet Take 1.5 tablets (75 mg total) by mouth daily. 10/11/17 01/09/18  Sunnie Nielsen, DO  sertraline (ZOLOFT) 50 MG tablet TAKE 1 AND 1/2 TABLETS BY  MOUTH DAILY 10/25/17   Sunnie Nielsen, DO  Zinc 50 MG TABS Take 50 mg by mouth daily.    [provider]    Family History Family History  Problem Relation Age of Onset  . Depression Mother   . Diabetes Mother   . Depression Father   . Hyperlipidemia Father   . Hypertension Father   . Cancer Paternal Aunt     Social History Social History   Tobacco Use  . Smoking status: Never Smoker  . Smokeless tobacco: Never Used  Substance Use Topics  . Alcohol use: Not Currently    Alcohol/week: 0.0 standard drinks  . Drug use: Never     Allergies   Patient has no known allergies.   Review of Systems Review of Systems + sore throat + cough + pleuritic pain on left + wheezing + nasal congestion + post-nasal drainage No sinus pain/pressure No itchy/red eyes ? Left earache No hemoptysis No SOB No fever, + chills/sweats No nausea No vomiting No abdominal pain No diarrhea No urinary symptoms No skin rash + fatigue + myalgias + headache Used OTC meds without relief   Physical Exam Triage Vital Signs ED Triage Vitals  Enc Vitals Group     BP 01/10/18 0911 116/83     Pulse Rate 01/10/18 0911 77     Resp 01/10/18 0911 19     Temp 01/10/18 0911 98.3 F (36.8 C)     Temp Source 01/10/18 0911 Oral     SpO2 01/10/18 0911 98 %     Weight 01/10/18 0912 (!) 325 lb (147.4 kg)     Height 01/10/18 0912 5\' 6"  (1.676 m)     Head Circumference --      Peak Flow --      Pain Score 01/10/18 0912 0     Pain Loc --      Pain Edu? --      Excl. in GC? --    No data found.  Updated Vital Signs BP 116/83 (BP Location: Right Arm)   Pulse 77   Temp 98.3 F (36.8 C)  (Oral)   Resp 19   Ht 5\' 6"  (1.676 m)   Wt (!) 147.4 kg   LMP 12/20/2017 (Approximate)   SpO2 98%   BMI 52.46 kg/m  Visual Acuity Right Eye Distance:   Left Eye Distance:   Bilateral Distance:    Right Eye Near:   Left Eye Near:    Bilateral Near:     Physical Exam Nursing notes and Vital Signs reviewed. Appearance:  Patient appears stated age, and in no acute distress Eyes:  Pupils are equal, round, and reactive to light and accomodation.  Extraocular movement is intact.  Conjunctivae are not inflamed  Ears:  Canals normal.  Tympanic membranes normal.  Nose:  Mildly congested turbinates.  No sinus tenderness  Pharynx:  Normal Neck:  Supple.  Enlarged posterior/lateral nodes are palpated bilaterally, tender to palpation on the left.   Lungs:  Bilateral posterior superior wheezes, more prominent on the left.  Breath sounds are equal.  Moving air well. Heart:  Regular rate and rhythm without murmurs, rubs, or gallops.  Abdomen:  Nontender without masses or hepatosplenomegaly.  Bowel sounds are present.  No CVA or flank tenderness.  Extremities:  No edema.  Skin:  No rash present.    UC Treatments / Results  Labs (all labs ordered are listed, but only abnormal results are displayed) Labs Reviewed - No data to display  EKG None  Radiology Dg Chest 2 View  Result Date: 01/10/2018 CLINICAL DATA:  Cough for 2 weeks EXAM: CHEST - 2 VIEW COMPARISON:  None. FINDINGS: There is no edema or consolidation. The heart size and pulmonary vascularity are normal. No adenopathy. No bone lesions. IMPRESSION: No edema or consolidation. Electronically Signed   By: Bretta Bang III M.D.   On: 01/10/2018 09:31    Procedures Procedures (including critical care time)  Medications Ordered in UC Medications - No data to display  Initial Impression / Assessment and Plan / UC Course  I have reviewed the triage vital signs and the nursing notes.  Pertinent labs & imaging results that  were available during my care of the patient were reviewed by me and considered in my medical decision making (see chart for details).    Suspect initial viral URI, now developing bacterial bronchitis with bronchospasm.  Negative chest X-ray reassuring. Begin empiric doxycycline and prednisone burst/taper. Prescription written for Benzonatate [facility]) to take at bedtime for night-time cough.  Followup with Family Doctor if not improved in 7 to 10 days.   Final Clinical Impressions(s) / UC Diagnoses   Final diagnoses:  Bronchospasm with bronchitis, acute     Discharge Instructions     Take plain guaifenesin (1200mg  extended release tabs such as Mucinex) twice daily, with plenty of water, for cough and congestion.  May add Pseudoephedrine (30mg , one or two every 4 to 6 hours) for sinus congestion.  Get adequate rest.   Try warm salt water gargles for sore throat.  Stop all antihistamines for now, and other non-prescription cough/cold preparations. May take Delsym Cough Suppressant with Tessalon at bedtime for nighttime cough.     ED Prescriptions    Medication Sig Dispense Auth. Provider   predniSONE (DELTASONE) 20 MG tablet Take one tab by mouth twice daily for 4 days, then one daily for 3 days. Take with food. 11 tablet Lattie Haw, MD   doxycycline (VIBRAMYCIN) 100 MG capsule Take 1 capsule (100 mg total) by mouth 2 (two) times daily. Take with food. 14 capsule Lattie Haw, MD   benzonatate (TESSALON) 200 MG capsule Take one cap by mouth at bedtime as needed for cough.  May repeat in 4 to 6 hours 15 capsule Cathren Harsh Tera Mater, MD  Lattie HawBeese, Gaelen Brager A, MD 01/10/18 1024

## 2018-03-08 ENCOUNTER — Other Ambulatory Visit: Payer: Self-pay | Admitting: Osteopathic Medicine

## 2018-03-08 DIAGNOSIS — F411 Generalized anxiety disorder: Secondary | ICD-10-CM

## 2018-04-14 ENCOUNTER — Emergency Department (INDEPENDENT_AMBULATORY_CARE_PROVIDER_SITE_OTHER)
Admission: EM | Admit: 2018-04-14 | Discharge: 2018-04-14 | Disposition: A | Discharge status: Home / Self Care | Payer: 59 | Source: Home / Self Care

## 2018-04-14 ENCOUNTER — Other Ambulatory Visit: Payer: Self-pay

## 2018-04-14 DIAGNOSIS — H6692 Otitis media, unspecified, left ear: Secondary | ICD-10-CM | POA: Diagnosis not present

## 2018-04-14 DIAGNOSIS — J069 Acute upper respiratory infection, unspecified: Secondary | ICD-10-CM

## 2018-04-14 MED ORDER — AMOXICILLIN-POT CLAVULANATE 875-125 MG PO TABS: 1.0000 | ORAL_TABLET | Freq: Two times a day (BID) | ORAL | 0 refills | Status: DC

## 2018-04-14 NOTE — ED Triage Notes (Signed)
Sx started about 1 week ago, with left ear pain/fullness.  Pt describes pain as sharp shooting pain, and facial swelling towards the end of the day.

## 2018-04-14 NOTE — Discharge Instructions (Signed)
°  Please take antibiotics as prescribed and be sure to complete entire course even if you start to feel better to ensure infection does not come back.  STOP using the previously prescribed eye/ear drops as they will only cause added irritation to your ear canal.   You may take Tylenol and Motrin (acetaminophen and ibuprofen) as needed for pain, inflammation and fever.  Please follow up with family medicine in 1 week if not improving.

## 2018-04-14 NOTE — ED Provider Notes (Signed)
[facility]    CSN: [id number] Arrival date & time: 04/14/18  0904     History   Chief Complaint Chief Complaint  Patient presents with  . Otalgia    HPI Regina Andrews is a 33 y.o. female.   HPI  Regina Andrews is a 33 y.o. female presenting to UC with c/o 1 week of mild congestion and scratchy throat that has developed into Left ear pain. She has used previously prescribed ophthalmic ofloxacin drops in her ear w/o relief.  Pain is aching with occasional sharp pain.  Mild swelling and sinus pain/pressure by the end of the day. Denies fever, chills, cough, n/v/d. No known sick contacts.    Past Medical History:  Diagnosis Date  . Anxiety   . Cervical cancer screening 04/10/2015   Hx abn Pap 05/2014   . 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 Family history of depression 04/10/2015   both parents   . Family history of diabetes mellitus in first degree relative 04/10/2015   mother   . Family history of heart disease 04/10/2015  . History of abdominoplasty 04/10/2015  . History of breast augmentation 04/10/2015  . History of cholecystectomy 04/10/2015  . History of goiter 04/10/2015  . History of PCOS 04/10/2015  . History of tonsillectomy 04/10/2015    Patient Active Problem List   Diagnosis Date Noted  . Pain of left thumb 08/05/2016  . Weight gain 08/05/2016  . Headache in pregnancy, antepartum 09/09/2015  . Von Willebrand disease, type IIa (HCC) 07/12/2015  . History of dysphagia 06/20/2015  . Vasovagal near syncope 06/11/2015  . Encounter for supervision of normal pregnancy in multigravida 05/13/2015  . Generalized anxiety disorder 04/16/2015  . Anxiety state 04/10/2015  . Cervical cancer screening 04/10/2015  . History of PCOS 04/10/2015  . History of goiter 04/10/2015  . Family history of depression 04/10/2015  . Family history of  diabetes mellitus in first degree relative 04/10/2015  . Family history of heart disease 04/10/2015  . History of breast augmentation 04/10/2015  . History of abdominoplasty 04/10/2015  . History of cholecystectomy 04/10/2015  . History of tonsillectomy 04/10/2015    Past Surgical History:  Procedure Laterality Date  . BREAST ENHANCEMENT SURGERY    . CHOLECYSTECTOMY    . TONSILLECTOMY    . TUMMY TUCK      OB History    Gravida  2   Para  2   Term  2   Preterm      AB      Living  2     SAB      TAB      Ectopic      Multiple      Live Births  2            Home Medications    Prior to Admission medications   Medication Sig Start Date End Date Taking? Authorizing Provider  AMBULATORY NON FORMULARY MEDICATION Medication Name: Spirulina    [provider]  amoxicillin-clavulanate (AUGMENTIN) 875-125 MG tablet Take 1 tablet by mouth 2 (two) times daily. One po bid x 7 days 04/14/18   Lurene Shadow, PA-C  Ascorbic Acid (VITAMIN C) 100 MG tablet Take 100 mg by mouth daily.    [provider]  benzonatate (TESSALON) 200 MG capsule Take one cap by mouth at bedtime  as needed for cough.  May repeat in 4 to 6 hours 01/10/18   Lattie Haw, MD  Calcium Carb-Cholecalciferol (CALCIUM + D3) 600-200 MG-UNIT TABS Take by mouth daily. Take 1 tablet daily    [provider]  FOLIC ACID PO Take 1 tablet by mouth daily. Pt takes a folic acid tablet daily, but does not know the strength.    [provider]  hydrOXYzine (ATARAX/VISTARIL) 25 MG tablet Take 1-2 tablets (25-50 mg total) by mouth 3 (three) times daily as needed for anxiety (insomnia). 04/10/15   Sunnie Nielsen, DO  MELATONIN PO Take by mouth.    [provider]  Multiple Vitamin (MULTIVITAMIN) tablet Take 1 tablet by mouth daily.    [provider]  predniSONE (DELTASONE) 20 MG tablet Take one tab by mouth twice daily for 4 days, then one daily for 3 days.  Take with food. 01/10/18   Lattie Haw, MD  sertraline (ZOLOFT) 50 MG tablet Take 1.5 tablets (75 mg total) by mouth daily. 10/11/17 01/09/18  Sunnie Nielsen, DO  sertraline (ZOLOFT) 50 MG tablet TAKE 1 AND 1/2 TABLETS BY  MOUTH DAILY 03/09/18   Sunnie Nielsen, DO  Zinc 50 MG TABS Take 50 mg by mouth daily.    [provider]    Family History Family History  Problem Relation Age of Onset  . Depression Mother   . Diabetes Mother   . Depression Father   . Hyperlipidemia Father   . Hypertension Father   . Cancer Paternal Aunt     Social History Social History   Tobacco Use  . Smoking status: Never Smoker  . Smokeless tobacco: Never Used  Substance Use Topics  . Alcohol use: Not Currently    Alcohol/week: 0.0 standard drinks  . Drug use: Never     Allergies   Patient has no known allergies.   Review of Systems Review of Systems  Constitutional: Negative for chills and fever.  HENT: Positive for congestion, ear pain (left), sinus pressure and sore throat. Negative for sinus pain, trouble swallowing and voice change.   Respiratory: Negative for cough and shortness of breath.   Cardiovascular: Negative for chest pain and palpitations.  Gastrointestinal: Negative for abdominal pain, diarrhea, nausea and vomiting.  Musculoskeletal: Negative for arthralgias, back pain and myalgias.  Skin: Negative for rash.  Neurological: Positive for headaches. Negative for dizziness and light-headedness.     Physical Exam Triage Vital Signs ED Triage Vitals  Enc Vitals Group     BP 04/14/18 0939 128/87     Pulse Rate 04/14/18 0939 71     Resp 04/14/18 0939 20     Temp 04/14/18 0939 97.7 F (36.5 C)     Temp Source 04/14/18 0939 Oral     SpO2 04/14/18 0939 97 %     Weight 04/14/18 0940 (!) 337 lb (152.9 kg)     Height 04/14/18 0940  (1.778 m)     Head Circumference --      Peak Flow --      Pain Score 04/14/18 0939 6     Pain Loc --      Pain Edu? --       Excl. in GC? --    No data found.  Updated Vital Signs BP 128/87 (BP Location: Right Arm)   Pulse 71   Temp 97.7 F (36.5 C) (Oral)   Resp 20   Ht  (1.778 m)   Wt (!) 337 lb (  152.9 kg)   LMP 03/27/2018   SpO2 97%   BMI 48.35 kg/m   Visual Acuity Right Eye Distance:   Left Eye Distance:   Bilateral Distance:    Right Eye Near:   Left Eye Near:    Bilateral Near:     Physical Exam Vitals signs and nursing note reviewed.  Constitutional:      Appearance: Normal appearance. She is well-developed.  HENT:     Head: Normocephalic and atraumatic.     Right Ear: Tympanic membrane normal.     Left Ear: Tympanic membrane is erythematous. Tympanic membrane is not bulging.     Nose: Nose normal.     Right Sinus: No maxillary sinus tenderness or frontal sinus tenderness.     Left Sinus: No maxillary sinus tenderness or frontal sinus tenderness.     Mouth/Throat:     Lips: Pink.     Mouth: Mucous membranes are moist.     Pharynx: Oropharynx is clear. Uvula midline.  Neck:     Musculoskeletal: Normal range of motion.  Cardiovascular:     Rate and Rhythm: Normal rate and regular rhythm.  Pulmonary:     Effort: Pulmonary effort is normal. No respiratory distress.     Breath sounds: Normal breath sounds. No stridor. No wheezing or rhonchi.  Musculoskeletal: Normal range of motion.  Skin:    General: Skin is warm and dry.  Neurological:     Mental Status: She is alert and oriented to person, place, and time.  Psychiatric:        Behavior: Behavior normal.      UC Treatments / Results  Labs (all labs ordered are listed, but only abnormal results are displayed) Labs Reviewed - No data to display  EKG None  Radiology No results found.  Procedures Procedures (including critical care time)  Medications Ordered in UC Medications - No data to display  Initial Impression / Assessment and Plan / UC Course  I have reviewed the triage vital signs and the  nursing notes.  Pertinent labs & imaging results that were available during my care of the patient were reviewed by me and considered in my medical decision making (see chart for details).     Hx and exam c/w Left AOM Will tx with Augmentin AVS provided  Final Clinical Impressions(s) / UC Diagnoses   Final diagnoses:  Left acute otitis media  Upper respiratory tract infection, unspecified type     Discharge Instructions      Please take antibiotics as prescribed and be sure to complete entire course even if you start to feel better to ensure infection does not come back.  STOP using the previously prescribed eye/ear drops as they will only cause added irritation to your ear canal.   You may take Tylenol and Motrin (acetaminophen and ibuprofen) as needed for pain, inflammation and fever.  Please follow up with family medicine in 1 week if not improving.     ED Prescriptions    Medication Sig Dispense Auth. Provider   amoxicillin-clavulanate (AUGMENTIN) 875-125 MG tablet Take 1 tablet by mouth 2 (two) times daily. One po bid x 7 days 14 tablet Lurene Shadow, [place]     Controlled Substance Prescriptions [place] Controlled Substance Registry consulted? Not Applicable   Lurene Shadow, PA-C 04/14/18 1200

## 2018-06-02 ENCOUNTER — Other Ambulatory Visit: Payer: Self-pay | Admitting: Osteopathic Medicine

## 2018-06-02 DIAGNOSIS — F411 Generalized anxiety disorder: Secondary | ICD-10-CM

## 2018-06-13 ENCOUNTER — Ambulatory Visit (INDEPENDENT_AMBULATORY_CARE_PROVIDER_SITE_OTHER): Payer: 59 | Admitting: Osteopathic Medicine

## 2018-06-13 ENCOUNTER — Encounter: Payer: Self-pay | Admitting: Osteopathic Medicine

## 2018-06-13 VITALS — Ht 70.0 in | Wt 340.0 lb

## 2018-06-13 DIAGNOSIS — F411 Generalized anxiety disorder: Secondary | ICD-10-CM | POA: Diagnosis not present

## 2018-06-13 DIAGNOSIS — F321 Major depressive disorder, single episode, moderate: Secondary | ICD-10-CM

## 2018-06-13 DIAGNOSIS — R635 Abnormal weight gain: Secondary | ICD-10-CM

## 2018-06-13 MED ORDER — VORTIOXETINE HBR 10 MG PO TABS: 10.0000 mg | ORAL_TABLET | Freq: Every day | ORAL | 5 refills | Status: DC

## 2018-06-13 NOTE — Progress Notes (Signed)
Virtual Visit via Video (App used: Doximity) Note  I connected with      Regina Andrews on 06/13/18 at 8:30 AM by a telemedicine application and verified that I am speaking with the correct person using two identifiers.  Patient is at home I am working remotely, from home    I discussed the limitations of evaluation and management by telemedicine and the availability of in person appointments. The patient expressed understanding and agreed to proceed.  History of Present Illness: Regina Andrews is a 33 y.o. female who would like to discuss  Chief Complaint  Patient presents with  . Anxiety  . Weight Gain   Previous medications Paxil, Wellbutrin - didn't seem to work   Current medication: Zoloft 50 mg taking 1.5 tablets   Concern for increasing depressive symptoms, weight gain of about 100 lbs since birth of daughter 2 years ago. Labs back 09/2017 were all ok. Reports MSK pain d/t weight gain, difficulty with motivation d/t depression.   Wt Readings from Last 3 Encounters:  06/13/18 (!) 340 lb (154.2 kg)  04/14/18 (!) 337 lb (152.9 kg)  01/10/18 (!) 325 lb (147.4 kg)    Depression screen [facility] 2/9 06/13/2018 10/11/2017 08/05/2016  Decreased Interest 2 0 0  Down, Depressed, Hopeless 1 0 1  PHQ - 2 Score 3 0 1  Altered sleeping 3 2 0  Tired, decreased energy Change in appetite Feeling bad or failure about yourself  2 0 0  Trouble concentrating 1 1 0  Moving slowly or fidgety/restless 0 0 0  Suicidal thoughts 0 0 0  PHQ-9 Score Difficult doing work/chores Somewhat difficult Somewhat difficult -   GAD 7 : Generalized Anxiety Score 06/13/2018 10/11/2017 08/05/2016 02/06/2016  Nervous, Anxious, on Edge Control/stop worrying 1 0 1 0  Worry too much - different things 1 0 1 0  Trouble relaxing 2 0 0 1  Restless 0 0 0 1  Easily annoyed or irritable 3 1 0 1  Afraid - awful might happen 0 0 0 0  Total GAD 7 Score Anxiety Difficulty Not  difficult at all Not difficult at all - -         Observations/Objective: Ht  (1.778 m)   Wt (!) 340 lb (154.2 kg)   LMP 05/16/2018 (LMP Unknown)   BMI 48.78 kg/m  BP Readings from Last 3 Encounters:  04/14/18 128/87  01/10/18 116/83  10/11/17 113/78   Exam: Normal Speech.  NAD  Lab and Radiology Results No results found for this or any previous visit (from the past 72 hour(s)). No results found.     Assessment and Plan: 33 y.o. female with The primary encounter diagnosis was Major depressive disorder, single episode, moderate (HCC). Diagnoses of Generalized anxiety disorder and Abnormal weight gain were also pertinent to this visit.   PDMP not reviewed this encounter. No orders of the defined types were placed in this encounter.  Meds ordered this encounter  Medications  . vortioxetine HBr (TRINTELLIX) 10 MG TABS tablet    Sig: Take 1 tablet (10 mg total) by mouth daily.    Dispense:  30 tablet    Refill:  5   Patient Instructions  Plan:  Moods  Take Zoloft 50 mg / 1 pill (decrease from 75 mg / 1.5 pills)   When you get the Trintellix, stop the Zoloft  and start the new medicine   Weight  Will consider medications to help with weight loss:   Qsymia (Phentermine and Topiramate)  Saxenda (Liraglutide injection daily)  Ozempic (Semaglutide injection weekly)  Contrave (Bupropion and Naltrexone) - not a good option for you, Bupropion = Wellbutrin!  I recommend that you research the above medications and see which one(s) your insurance may or may not cover: If you call your insurance, ask them specifically what medications are on their formulary that are approved for obesity treatment. They should be able to send you a list or tell you over the phone. Remember, medications aren't magic! You MUST be diligent about lifestyle changes as well!     Instructions sent via MyChart. If MyChart not available, pt was given option for info via personal e-mail  w/ no guarantee of protected health info over unsecured e-mail communication, and MyChart sign-up instructions were included.   Follow Up Instructions: Return in about 2 weeks (around 06/27/2018) for virtual visit on new medications, follow-up on weight .    I discussed the assessment and treatment plan with the patient. The patient was provided an opportunity to ask questions and all were answered. The patient agreed with the plan and demonstrated an understanding of the instructions.   The patient was advised to call back or seek an in-person evaluation if any new concerns, if symptoms worsen or if the condition fails to improve as anticipated.  25 minutes of non-face-to-face time was provided during this encounter.                      Historical information moved to improve visibility of documentation.  Past Medical History:  Diagnosis Date  . Anxiety   . Cervical cancer screening 04/10/2015   Hx abn Pap 05/2014   . 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 Family history of depression 04/10/2015   both parents   . Family history of diabetes mellitus in first degree relative 04/10/2015   mother   . Family history of heart disease 04/10/2015  . History of abdominoplasty 04/10/2015  . History of breast augmentation 04/10/2015  . History of cholecystectomy 04/10/2015  . History of goiter 04/10/2015  . History of PCOS 04/10/2015  . History of tonsillectomy 04/10/2015   Past Surgical History:  Procedure Laterality Date  . BREAST ENHANCEMENT SURGERY    . CHOLECYSTECTOMY    . TONSILLECTOMY    . TUMMY TUCK     Social History   Tobacco Use  . Smoking status: Never Smoker  . Smokeless tobacco: Never Used  Substance Use Topics  . Alcohol use: Not Currently    Alcohol/week: 0.0 standard drinks   family history includes Cancer in her paternal aunt;  Depression in her father and mother; Diabetes in her mother; Hyperlipidemia in her father; Hypertension in her father.  Medications: Current Outpatient Medications  Medication Sig Dispense Refill  . MELATONIN PO Take by mouth.    . Multiple Vitamin (MULTIVITAMIN) tablet Take 1 tablet by mouth daily.    . sertraline (ZOLOFT) 50 MG tablet TAKE 1 AND 1/2 TABLETS BY  MOUTH DAILY 135 tablet 0  . AMBULATORY NON FORMULARY MEDICATION Medication Name: Spirulina    . Ascorbic Acid (VITAMIN C) 100 MG tablet Take 100 mg by mouth daily.    . Calcium Carb-Cholecalciferol (CALCIUM + D3) 600-200 MG-UNIT  TABS Take by mouth daily. Take 1 tablet daily    . FOLIC ACID PO Take 1 tablet by mouth daily. Pt takes a folic acid tablet daily, but does not know the strength.    . vortioxetine HBr (TRINTELLIX) 10 MG TABS tablet Take 1 tablet (10 mg total) by mouth daily. 30 tablet 5  . Zinc 50 MG TABS Take 50 mg by mouth daily.     No current facility-administered medications for this visit.    No Known Allergies  PDMP not reviewed this encounter. No orders of the defined types were placed in this encounter.  Meds ordered this encounter  Medications  . vortioxetine HBr (TRINTELLIX) 10 MG TABS tablet    Sig: Take 1 tablet (10 mg total) by mouth daily.    Dispense:  30 tablet    Refill:  5

## 2018-06-13 NOTE — Patient Instructions (Addendum)
Plan:  Moods  Take Zoloft 50 mg / 1 pill (decrease from 75 mg / 1.5 pills)   When you get the Trintellix, stop the Zoloft and start the new medicine   Weight  Will consider medications to help with weight loss:   Qsymia (Phentermine and Topiramate)  Saxenda (Liraglutide injection daily)  Ozempic (Semaglutide injection weekly)  Contrave (Bupropion and Naltrexone) - not a good option for you, Bupropion = Wellbutrin!  I recommend that you research the above medications and see which one(s) your insurance may or may not cover: If you call your insurance, ask them specifically what medications are on their formulary that are approved for obesity treatment. They should be able to send you a list or tell you over the phone. Remember, medications aren't magic! You MUST be diligent about lifestyle changes as well!

## 2018-06-15 ENCOUNTER — Encounter: Payer: Self-pay | Admitting: Osteopathic Medicine

## 2018-06-15 ENCOUNTER — Telehealth: Payer: Self-pay | Admitting: Osteopathic Medicine

## 2018-06-15 NOTE — Telephone Encounter (Signed)
Received fax from [employer] that Trintellix requires a PA. Information has been sent to the insurance company. Awaiting determination.   

## 2018-06-16 NOTE — Telephone Encounter (Signed)
Received a fax from [employer] that Trintellix has been approved from 05/2018 through 06/15/2019. Pharmacy aware and form sent to scan.

## 2018-06-18 ENCOUNTER — Other Ambulatory Visit: Payer: Self-pay | Admitting: Osteopathic Medicine

## 2018-06-18 DIAGNOSIS — F411 Generalized anxiety disorder: Secondary | ICD-10-CM

## 2018-06-27 ENCOUNTER — Ambulatory Visit: Payer: 59 | Admitting: Osteopathic Medicine

## 2018-07-01 ENCOUNTER — Encounter (INDEPENDENT_AMBULATORY_CARE_PROVIDER_SITE_OTHER): Payer: 59 | Admitting: Osteopathic Medicine

## 2018-07-01 ENCOUNTER — Encounter: Payer: Self-pay | Admitting: Osteopathic Medicine

## 2018-07-01 DIAGNOSIS — F411 Generalized anxiety disorder: Secondary | ICD-10-CM

## 2018-07-04 MED ORDER — TOPIRAMATE 50 MG PO TABS: 50.0000 mg | ORAL_TABLET | Freq: Every day | ORAL | 0 refills | Status: DC

## 2018-07-04 MED ORDER — PHENTERMINE HCL 8 MG PO TABS: 8.0000 mg | ORAL_TABLET | Freq: Every day | ORAL | 2 refills | Status: DC

## 2018-07-04 NOTE — Telephone Encounter (Signed)
5 mins spent  

## 2018-08-31 ENCOUNTER — Other Ambulatory Visit: Payer: Self-pay | Admitting: Osteopathic Medicine

## 2018-08-31 DIAGNOSIS — F411 Generalized anxiety disorder: Secondary | ICD-10-CM

## 2018-08-31 NOTE — Telephone Encounter (Signed)
Forwarding medication refill request to PCP for review. 

## 2019-02-14 ENCOUNTER — Telehealth (INDEPENDENT_AMBULATORY_CARE_PROVIDER_SITE_OTHER): Payer: 59 | Admitting: Osteopathic Medicine

## 2019-02-14 VITALS — Wt 348.0 lb

## 2019-02-14 DIAGNOSIS — Z6841 Body Mass Index (BMI) 40.0 and over, adult: Secondary | ICD-10-CM

## 2019-02-14 DIAGNOSIS — Z833 Family history of diabetes mellitus: Secondary | ICD-10-CM | POA: Diagnosis not present

## 2019-02-14 DIAGNOSIS — Z8742 Personal history of other diseases of the female genital tract: Secondary | ICD-10-CM

## 2019-02-14 DIAGNOSIS — R635 Abnormal weight gain: Secondary | ICD-10-CM | POA: Diagnosis not present

## 2019-02-14 DIAGNOSIS — J019 Acute sinusitis, unspecified: Secondary | ICD-10-CM

## 2019-02-14 MED ORDER — AMOXICILLIN-POT CLAVULANATE 875-125 MG PO TABS: 1.0000 | ORAL_TABLET | Freq: Two times a day (BID) | ORAL | 0 refills | Status: DC

## 2019-02-14 MED ORDER — IPRATROPIUM BROMIDE 0.06 % NA SOLN: 2.0000 | Freq: Four times a day (QID) | NASAL | 1 refills | Status: DC

## 2019-02-14 NOTE — Progress Notes (Signed)
Virtual Visit via Video (App used: Doximity) Note  I connected with      Regina Andrews on 02/14/19 at 10:44 AM  by a telemedicine application and verified that I am speaking with the correct person using two identifiers.  Patient is at home I am in office   I discussed the limitations of evaluation and management by telemedicine and the availability of in person appointments. The patient expressed understanding and agreed to proceed.  History of Present Illness: Regina Andrews is a 34 y.o. female who would like to discuss sinus problem   L ear pain and swelling feeling behind throat and ear Ear has been 4 days Drainage and swelling w/ nasal congestion 2 days So far, has tried decongestant without much help       Observations/Objective: Wt (!) 348 lb (157.9 kg)   LMP 01/20/2019   BMI 49.93 kg/m  BP Readings from Last 3 Encounters:  04/14/18 128/87  01/10/18 116/83  10/11/17 113/78   Exam: Normal Speech.  NAd  Lab and Radiology Results No results found for this or any previous visit (from the past 72 hour(s)). No results found.     Assessment and Plan: 35 y.o. female with The primary encounter diagnosis was Acute non-recurrent sinusitis, unspecified location. Diagnoses of Family history of diabetes mellitus in first degree relative, Weight gain, and History of PCOS were also pertinent to this visit.  Possible sinusitis/allergies/otitis, difficult to say without physical assessment, in the setting of Covid pandemic I did encourage self-isolation and testing   PDMP not reviewed this encounter. Orders Placed This Encounter  Procedures  . CBC  . COMPLETE METABOLIC PANEL WITH GFR  . Lipid panel  . Hemoglobin A1c  . TSH   Meds ordered this encounter  Medications  . amoxicillin-clavulanate (AUGMENTIN) 875-125 MG tablet    Sig: Take 1 tablet by mouth 2 (two) times daily.    Dispense:  14 tablet    Refill:  0  . ipratropium (ATROVENT) 0.06 % nasal spray   Sig: Place 2 sprays into both nostrils 4 (four) times daily.    Dispense:  15 mL    Refill:  1   Patient Instructions  Over-the-Counter Medications & Home Remedies for Upper Respiratory Illness  Note: the following list assumes no pregnancy, normal liver & kidney function and no other drug interactions. Dr. Lyn Hollingshead has highlighted medications which are safe for you to use, but these may not be appropriate for everyone. Always ask a pharmacist or qualified medical provider if you have any questions!   Aches/Pains, Fever, Headache Acetaminophen (Tylenol) 500 mg tablets - take max 2 tablets (1000 mg) every 6 hours (4 times per day)  Ibuprofen (Motrin) 200 mg tablets - take max 4 tablets (800 mg) every 6 hours  Sinus Congestion Prescription Atrovent as directed Nasal Saline if desired Phenylephrine (Sudafed) 10 mg tablets every 4 hours (or the 12-hour formulation)* Diphenhydramine (Benadryl) 25 mg tablets - take max 2 tablets every 4 hours  Cough & Sore Throat Prescription cough pills or syrups as directed Dextromethorphan (Robitussin, others) - cough suppressant Guaifenesin (Robitussin, Mucinex, others) - expectorant (helps cough up mucus) (Dextromethorphan and Guaifenesin also come in a combination tablet) Lozenges w/ Benzocaine + Menthol (Cepacol) Honey - as much as you want! Teas which "coat the throat" - look for ingredients Elm Bark, Licorice Root, Marshmallow Root  Other Antibiotics if these are prescribed - take ALL, even if you're feeling better  Don't waste your money on  Vitamin C or Echinacea       Instructions sent via MyChart. If MyChart not available, pt was given option for info via personal e-mail w/ no guarantee of protected health info over unsecured e-mail communication, and MyChart sign-up instructions were sent to patient.   Follow Up Instructions: Return if symptoms worsen or fail to improve.    I discussed the assessment and treatment plan with the  patient. The patient was provided an opportunity to ask questions and all were answered. The patient agreed with the plan and demonstrated an understanding of the instructions.   The patient was advised to call back or seek an in-person evaluation if any new concerns, if symptoms worsen or if the condition fails to improve as anticipated.  25 minutes of non-face-to-face time was provided during this encounter.      . . . . . . . . . . . . . [name]                   Historical information moved to improve visibility of documentation.  Past Medical History:  Diagnosis Date  . Anxiety   . Cervical cancer screening 04/10/2015   Hx abn Pap 05/2014   . 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 Family history of depression 04/10/2015   both parents   . Family history of diabetes mellitus in first degree relative 04/10/2015   mother   . Family history of heart disease 04/10/2015  . History of abdominoplasty 04/10/2015  . History of breast augmentation 04/10/2015  . History of cholecystectomy 04/10/2015  . History of goiter 04/10/2015  . History of PCOS 04/10/2015  . History of tonsillectomy 04/10/2015   Past Surgical History:  Procedure Laterality Date  . BREAST ENHANCEMENT SURGERY    . CHOLECYSTECTOMY    . TONSILLECTOMY    . TUMMY TUCK     Social History   Tobacco Use  . Smoking status: Never Smoker  . Smokeless tobacco: Never Used  Substance Use Topics  . Alcohol use: Not Currently    Alcohol/week: 0.0 standard drinks   family history includes Cancer in her paternal aunt; Depression in her father and mother; Diabetes in her mother; Hyperlipidemia in her father; Hypertension in her father.  Medications: Current Outpatient Medications  Medication Sig Dispense Refill  . AMBULATORY NON FORMULARY MEDICATION Medication Name: Spirulina    .  Ascorbic Acid (VITAMIN C) 100 MG tablet Take 100 mg by mouth daily.    . Calcium Carb-Cholecalciferol (CALCIUM + D3) 600-200 MG-UNIT TABS Take by mouth daily. Take 1 tablet daily    . FOLIC ACID PO Take 1 tablet by mouth daily. Pt takes a folic acid tablet daily, but does not know the strength.    [name] MELATONIN PO Take by mouth.    . Multiple Vitamin (MULTIVITAMIN) tablet Take 1 tablet by mouth daily.    . Phentermine HCl 8 MG TABS Take 8 mg by mouth daily. #30 for 30 days 30 each 2  . sertraline (ZOLOFT) 50 MG tablet TAKE 1 AND 1/2 TABLETS BY  MOUTH DAILY 135 tablet 3  . topiramate (TOPAMAX) 50 MG tablet Take 1 tablet (50 mg total) by mouth daily. 90 tablet 0  . vortioxetine HBr (TRINTELLIX) 10 MG TABS tablet Take 1 tablet (10 mg total) by mouth daily. 30 tablet 5  . Zinc 50  MG TABS Take 50 mg by mouth daily.    [name] amoxicillin-clavulanate (AUGMENTIN) 875-125 MG tablet Take 1 tablet by mouth 2 (two) times daily. 14 tablet 0  . ipratropium (ATROVENT) 0.06 % nasal spray Place 2 sprays into both nostrils 4 (four) times daily. 15 mL 1   No current facility-administered medications for this visit.   No Known Allergies

## 2019-02-14 NOTE — Patient Instructions (Signed)
Over-the-Counter Medications & Home Remedies for Upper Respiratory Illness  Note: the following list assumes no pregnancy, normal liver & kidney function and no other drug interactions. Dr. Lyn Hollingshead has highlighted medications which are safe for you to use, but these may not be appropriate for everyone. Always ask a pharmacist or qualified medical provider if you have any questions!   Aches/Pains, Fever, Headache Acetaminophen (Tylenol) 500 mg tablets - take max 2 tablets (1000 mg) every 6 hours (4 times per day)  Ibuprofen (Motrin) 200 mg tablets - take max 4 tablets (800 mg) every 6 hours  Sinus Congestion Prescription Atrovent as directed Nasal Saline if desired Phenylephrine (Sudafed) 10 mg tablets every 4 hours (or the 12-hour formulation)* Diphenhydramine (Benadryl) 25 mg tablets - take max 2 tablets every 4 hours  Cough & Sore Throat Prescription cough pills or syrups as directed Dextromethorphan (Robitussin, others) - cough suppressant Guaifenesin (Robitussin, Mucinex, others) - expectorant (helps cough up mucus) (Dextromethorphan and Guaifenesin also come in a combination tablet) Lozenges w/ Benzocaine + Menthol (Cepacol) Honey - as much as you want! Teas which "coat the throat" - look for ingredients Elm Bark, Licorice Root, Marshmallow Root  Other Antibiotics if these are prescribed - take ALL, even if you're feeling better  Don't waste your money on Vitamin C or Echinacea

## 2019-02-19 ENCOUNTER — Encounter: Payer: Self-pay | Admitting: Osteopathic Medicine

## 2019-02-19 DIAGNOSIS — H9209 Otalgia, unspecified ear: Secondary | ICD-10-CM

## 2019-02-20 NOTE — Telephone Encounter (Signed)
Please associate DX with referral and send.  Also please refer to pt and tell her why you want specialist.   Thanks!

## 2019-02-21 MED ORDER — DOXYCYCLINE HYCLATE 100 MG PO TABS: 100.0000 mg | ORAL_TABLET | Freq: Two times a day (BID) | ORAL | 0 refills | Status: DC

## 2019-02-24 ENCOUNTER — Ambulatory Visit: Payer: 59 | Admitting: Nurse Practitioner

## 2019-02-24 ENCOUNTER — Encounter: Payer: Self-pay | Admitting: Nurse Practitioner

## 2019-02-24 ENCOUNTER — Other Ambulatory Visit: Payer: Self-pay

## 2019-02-24 VITALS — BP 138/86 | HR 68 | Temp 98.1°F | Ht 70.0 in | Wt 354.0 lb

## 2019-02-24 DIAGNOSIS — M545 Low back pain, unspecified: Secondary | ICD-10-CM

## 2019-02-24 DIAGNOSIS — R1031 Right lower quadrant pain: Secondary | ICD-10-CM

## 2019-02-24 LAB — POCT URINALYSIS DIP (CLINITEK)
Bilirubin, UA: NEGATIVE
Glucose, UA: NEGATIVE mg/dL
Ketones, POC UA: NEGATIVE mg/dL
Leukocytes, UA: NEGATIVE
Nitrite, UA: NEGATIVE
POC PROTEIN,UA: 30 — AB
Spec Grav, UA: 1.02 (ref 1.010–1.025)
Urobilinogen, UA: 0.2 E.U./dL
pH, UA: 8 (ref 5.0–8.0)

## 2019-02-24 LAB — POCT URINE PREGNANCY: Preg Test, Ur: NEGATIVE

## 2019-02-24 MED ORDER — OXYCODONE-ACETAMINOPHEN 5-325 MG PO TABS: 1.0000 | ORAL_TABLET | Freq: Four times a day (QID) | ORAL | 0 refills | Status: DC | PRN

## 2019-02-24 MED ORDER — TAMSULOSIN HCL 0.4 MG PO CAPS: 0.4000 mg | ORAL_CAPSULE | Freq: Every day | ORAL | 3 refills | Status: DC

## 2019-02-24 NOTE — Progress Notes (Signed)
Acute Office Visit  Subjective:    Patient ID: Regina Andrews, female    DOB: [birth year], 34 y.o.   MRN: [id number]  Chief Complaint  Patient presents with  . Back Pain    HPI Patient is in today for severe, sharp back pain on the right flank radiating to the right lower quadrant for approximately the past 4 days.  Patient reports the pain at the least is a 2/10 at the most 7/10.  She reports the pain is cyclic.  She does have a history of kidney infections but currently denies fever, nausea, vomiting, diarrhea, dysuria.  She was on an antibiotic last week for an ear infection but has completed that.  She has a strong family history of kidney disease and kidney stones.  She reports her maternal grandfather passed away in his 25s from kidney failure and her mom is almost to the point of dialysis in her 59s.  She does not drink alcohol, does not smoke, and uses very little medication in order to protect her kidneys. She reports she has had this type of pain every now and then for the past year or 2, but it typically goes away on its own without any kind of treatment.  She denies any history of kidney stones personally. She reports the pain she is experiencing now is lasting longer than it ever has in the past and she is concerned.  She reports she has been referred to urology in the past however never heard from the office and therefore never followed up with that.   Past Medical History:  Diagnosis Date  . Anxiety   . Cervical cancer screening 04/10/2015   Hx abn Pap 05/2014   . 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 Family history of depression 04/10/2015   both parents   . Family history of diabetes mellitus in first degree relative 04/10/2015   mother   . Family history of heart disease 04/10/2015  . History of abdominoplasty 04/10/2015  . History of breast  augmentation 04/10/2015  . History of cholecystectomy 04/10/2015  . History of goiter 04/10/2015  . History of PCOS 04/10/2015  . History of tonsillectomy 04/10/2015    Past Surgical History:  Procedure Laterality Date  . BREAST ENHANCEMENT SURGERY    . CHOLECYSTECTOMY    . TONSILLECTOMY    . TUMMY TUCK      Family History  Problem Relation Age of Onset  . Depression Mother   . Diabetes Mother   . Depression Father   . Hyperlipidemia Father   . Hypertension Father   . Cancer Paternal Aunt     Social History   Socioeconomic History  . Marital status: Married    Spouse name: Not on file  . Number of children: Not on file  . Years of education: Not on file  . Highest education level: Not on file  Occupational History  . Not on file  Tobacco Use  . Smoking status: Never Smoker  . Smokeless tobacco: Never Used  Substance and Sexual Activity  . Alcohol use: Not Currently    Alcohol/week: 0.0 standard drinks  . Drug use: Never  . Sexual activity: Yes    Birth control/protection: None  Other Topics Concern  . Not on file  Social History Narrative  . Not on file   Social Determinants of Health  Financial Resource Strain:   . Difficulty of Paying Living Expenses: Not on file  Food Insecurity:   . Worried About Charity fundraiser in the Last Year: Not on file  . Ran Out of Food in the Last Year: Not on file  Transportation Needs:   . Lack of Transportation (Medical): Not on file  . Lack of Transportation (Non-Medical): Not on file  Physical Activity:   . Days of Exercise per Week: Not on file  . Minutes of Exercise per Session: Not on file  Stress:   . Feeling of Stress : Not on file  Social Connections:   . Frequency of Communication with Friends and Family: Not on file  . Frequency of Social Gatherings with Friends and Family: Not on file  . Attends Religious Services: Not on file  . Active Member of Clubs or Organizations: Not on file  . Attends Theatre manager Meetings: Not on file  . Marital Status: Not on file  Intimate Partner Violence:   . Fear of Current or Ex-Partner: Not on file  . Emotionally Abused: Not on file  . Physically Abused: Not on file  . Sexually Abused: Not on file    Outpatient Medications Prior to Visit  Medication Sig Dispense Refill  . Magnesium 100 MG CAPS Take 1 capsule by mouth 2 (two) times daily.    [name] MELATONIN PO Take by mouth.    . sertraline (ZOLOFT) 50 MG tablet TAKE 1 AND 1/2 TABLETS BY  MOUTH DAILY 135 tablet 3  . AMBULATORY NON FORMULARY MEDICATION Medication Name: Spirulina    . amoxicillin-clavulanate (AUGMENTIN) 875-125 MG tablet Take 1 tablet by mouth 2 (two) times daily. (Patient not taking: Reported on 02/24/2019) 14 tablet 0  . Ascorbic Acid (VITAMIN C) 100 MG tablet Take 100 mg by mouth daily.    . Calcium Carb-Cholecalciferol (CALCIUM + D3) 600-200 MG-UNIT TABS Take by mouth daily. Take 1 tablet daily    . doxycycline (VIBRA-TABS) 100 MG tablet Take 1 tablet (100 mg total) by mouth 2 (two) times daily. (Patient not taking: Reported on 02/24/2019) 10 tablet 0  . FOLIC ACID PO Take 1 tablet by mouth daily. Pt takes a folic acid tablet daily, but does not know the strength.    . ipratropium (ATROVENT) 0.06 % nasal spray Place 2 sprays into both nostrils 4 (four) times daily. (Patient not taking: Reported on 02/24/2019) 15 mL 1  . Multiple Vitamin (MULTIVITAMIN) tablet Take 1 tablet by mouth daily.    . Phentermine HCl 8 MG TABS Take 8 mg by mouth daily. #30 for 30 days (Patient not taking: Reported on 02/24/2019) 30 each 2  . topiramate (TOPAMAX) 50 MG tablet Take 1 tablet (50 mg total) by mouth daily. (Patient not taking: Reported on 02/24/2019) 90 tablet 0  . vortioxetine HBr (TRINTELLIX) 10 MG TABS tablet Take 1 tablet (10 mg total) by mouth daily. (Patient not taking: Reported on 02/24/2019) 30 tablet 5  . Zinc 50 MG TABS Take 50 mg by mouth daily.     No facility-administered medications  prior to visit.    No Known Allergies  Review of Systems  Constitutional: Negative for appetite change, chills, fatigue and fever.  Genitourinary: Positive for decreased urine volume, difficulty urinating, flank pain, frequency and urgency. Negative for dysuria, hematuria, menstrual problem, pelvic pain and vaginal bleeding.       Objective:    Physical Exam Vitals and nursing note reviewed.  Constitutional:  Appearance: Normal appearance.  Cardiovascular:     Rate and Rhythm: Normal rate and regular rhythm.     Pulses: Normal pulses.     Heart sounds: Normal heart sounds.  Pulmonary:     Effort: Pulmonary effort is normal.     Breath sounds: Normal breath sounds.  Abdominal:     General: There is no distension.     Palpations: Abdomen is soft. There is no mass.     Tenderness: There is abdominal tenderness in the right lower quadrant and suprapubic area. There is right CVA tenderness. There is no left CVA tenderness or guarding.     Hernia: No hernia is present.  Skin:    General: Skin is warm and dry.     Capillary Refill: Capillary refill takes less than 2 seconds.  Neurological:     General: No focal deficit present.     Mental Status: She is alert and oriented to person, place, and time.  Psychiatric:        Mood and Affect: Mood normal.        Behavior: Behavior normal.     BP 138/86   Pulse 68   Temp 98.1 F (36.7 C) (Oral)   Ht 5\' 10"  (1.778 m)   Wt (!) 354 lb 0.6 oz (160.6 kg)   SpO2 98%   BMI 50.80 kg/m  Wt Readings from Last 3 Encounters:  02/24/19 (!) 354 lb 0.6 oz (160.6 kg)  02/14/19 (!) 348 lb (157.9 kg)  06/13/18 (!) 340 lb (154.2 kg)    Health Maintenance Due  Topic Date Due  . INFLUENZA VACCINE  08/27/2018    There are no preventive care reminders to display for this patient.   Lab Results  Component Value Date   TSH 1.15 10/11/2017   Lab Results  Component Value Date   WBC 7.1 10/11/2017   HGB 13.3 10/11/2017   HCT 39.6  10/11/2017   MCV 89.0 10/11/2017   PLT 303 10/11/2017   Lab Results  Component Value Date   NA 139 10/11/2017   K 4.3 10/11/2017   CO2 28 10/11/2017   GLUCOSE 99 10/11/2017   BUN 10 10/11/2017   CREATININE 0.58 10/11/2017   BILITOT 0.6 10/11/2017   ALKPHOS 78 08/05/2016   AST 14 10/11/2017   ALT 17 10/11/2017   PROT 7.2 10/11/2017   ALBUMIN 4.7 08/05/2016   CALCIUM 9.7 10/11/2017   Lab Results  Component Value Date   CHOL 174 10/11/2017   Lab Results  Component Value Date   HDL 52 10/11/2017   Lab Results  Component Value Date   LDLCALC 102 (H) 10/11/2017   Lab Results  Component Value Date   TRIG 106 10/11/2017   Lab Results  Component Value Date   CHOLHDL 3.3 10/11/2017   Lab Results  Component Value Date   HGBA1C 5.5 10/11/2017       Assessment & Plan:   1. Acute right-sided low back pain without sciatica Strongly suspicious of kidney stones based on presentation and symptoms of cyclic nature. Patient does have a history of PCOS and therefore ovarian cysts cannot be ruled out without imaging.  Strong family history of kidney disease and stones. CT renal stone evaluation ordered.  The patient's pain did diminish while she was in the office.  We also discussed the option of watchful waiting through the weekend to see if the pain intensifies or returns. Prescription for Flomax sent to help facilitate urine flow in the event  of a kidney stone is present.  Written prescription for Percocet provided for the patient for a total of 15 tablets.  Proteinuria present.  Urine sent for culture.  Patient to return in 10 days for repeat urinalysis.  We will discuss at that time urology/nephrology referral. - POCT URINALYSIS DIP (CLINITEK) - CT RENAL STONE STUDY; Future - tamsulosin (FLOMAX) 0.4 MG CAPS capsule; Take 1 capsule (0.4 mg total) by mouth daily.  Dispense: 30 capsule; Refill: 3 - POCT urine pregnancy  2. Right lower quadrant abdominal pain See plan above -  CT RENAL STONE STUDY; Future  No follow-ups on file.    Tollie Eth, NP

## 2019-02-24 NOTE — Patient Instructions (Signed)

## 2019-02-27 ENCOUNTER — Other Ambulatory Visit: Payer: Self-pay

## 2019-02-27 ENCOUNTER — Ambulatory Visit (INDEPENDENT_AMBULATORY_CARE_PROVIDER_SITE_OTHER): Payer: 59

## 2019-02-27 DIAGNOSIS — R1031 Right lower quadrant pain: Secondary | ICD-10-CM | POA: Diagnosis not present

## 2019-02-27 DIAGNOSIS — M545 Low back pain, unspecified: Secondary | ICD-10-CM

## 2019-02-28 ENCOUNTER — Encounter: Payer: Self-pay | Admitting: Nurse Practitioner

## 2019-02-28 ENCOUNTER — Other Ambulatory Visit: Payer: Self-pay | Admitting: Nurse Practitioner

## 2019-02-28 DIAGNOSIS — R809 Proteinuria, unspecified: Secondary | ICD-10-CM

## 2019-02-28 LAB — COMPLETE METABOLIC PANEL WITH GFR
AG Ratio: 1.6 (calc) (ref 1.0–2.5)
ALT: 25 U/L (ref 6–29)
AST: 20 U/L (ref 10–30)
Albumin: 4.4 g/dL (ref 3.6–5.1)
Alkaline phosphatase (APISO): 85 U/L (ref 31–125)
BUN: 13 mg/dL (ref 7–25)
CO2: 26 mmol/L (ref 20–32)
Calcium: 9.6 mg/dL (ref 8.6–10.2)
Chloride: 100 mmol/L (ref 98–110)
Creat: 0.64 mg/dL (ref 0.50–1.10)
GFR, Est African American: 134 mL/min/{1.73_m2} (ref >=60)
GFR, Est Non African American: 116 mL/min/{1.73_m2} (ref >=60)
Globulin: 2.8 g/dL (calc) (ref 1.9–3.7)
Glucose, Bld: 121 mg/dL — ABNORMAL HIGH (ref 65–99)
Potassium: 4 mmol/L (ref 3.5–5.3)
Sodium: 138 mmol/L (ref 135–146)
Total Bilirubin: 0.5 mg/dL (ref 0.2–1.2)
Total Protein: 7.2 g/dL (ref 6.1–8.1)

## 2019-02-28 LAB — LIPID PANEL
Cholesterol: 193 mg/dL (ref <200)
HDL: 45 mg/dL — ABNORMAL LOW (ref >=50)
LDL Cholesterol (Calc): 121 mg/dL (calc) — ABNORMAL HIGH
Non-HDL Cholesterol (Calc): 148 mg/dL (calc) — ABNORMAL HIGH (ref <130)
Total CHOL/HDL Ratio: 4.3 (calc) (ref <5.0)
Triglycerides: 159 mg/dL — ABNORMAL HIGH (ref <150)

## 2019-02-28 LAB — TSH: TSH: 1.62 mIU/L

## 2019-02-28 LAB — HEMOGLOBIN A1C
Hgb A1c MFr Bld: 6 % of total Hgb — ABNORMAL HIGH (ref <5.7)
Mean Plasma Glucose: 126 (calc)
eAG (mmol/L): 7 (calc)

## 2019-02-28 LAB — CBC
HCT: 39.9 % (ref 35.0–45.0)
Hemoglobin: 13.1 g/dL (ref 11.7–15.5)
MCH: 29.6 pg (ref 27.0–33.0)
MCHC: 32.8 g/dL (ref 32.0–36.0)
MCV: 90.1 fL (ref 80.0–100.0)
MPV: 9.6 fL (ref 7.5–12.5)
Platelets: 326 10*3/uL (ref 140–400)
RBC: 4.43 10*6/uL (ref 3.80–5.10)
RDW: 12.5 % (ref 11.0–15.0)
WBC: 6.8 10*3/uL (ref 3.8–10.8)

## 2019-03-08 ENCOUNTER — Encounter: Payer: Self-pay | Admitting: Obstetrics and Gynecology

## 2019-03-08 ENCOUNTER — Ambulatory Visit (INDEPENDENT_AMBULATORY_CARE_PROVIDER_SITE_OTHER): Payer: 59 | Admitting: Obstetrics and Gynecology

## 2019-03-08 ENCOUNTER — Other Ambulatory Visit: Payer: Self-pay

## 2019-03-08 VITALS — BP 125/75 | HR 70 | Temp 98.4°F | Resp 16 | Ht 70.0 in | Wt 346.0 lb

## 2019-03-08 DIAGNOSIS — Z01419 Encounter for gynecological examination (general) (routine) without abnormal findings: Secondary | ICD-10-CM

## 2019-03-08 DIAGNOSIS — Z124 Encounter for screening for malignant neoplasm of cervix: Secondary | ICD-10-CM | POA: Diagnosis not present

## 2019-03-08 DIAGNOSIS — Z1151 Encounter for screening for human papillomavirus (HPV): Secondary | ICD-10-CM | POA: Diagnosis not present

## 2019-03-08 NOTE — Progress Notes (Signed)
GYNECOLOGY ANNUAL PREVENTATIVE CARE ENCOUNTER NOTE  History:     Regina Andrews is a 34 y.o. [phone] female here for a routine annual gynecologic exam.  Current complaints: none.   Denies abnormal vaginal bleeding, discharge, pelvic pain, problems with intercourse or other gynecologic concerns.    Gynecologic History Patient's last menstrual period was 01/20/2019. Contraception: none Last Pap: 2019. Results were: abnormal.   Obstetric History OB History  Gravida Para Term Preterm AB Living  2 2 2     2   SAB TAB Ectopic Multiple Live Births          2    # Outcome Date GA Lbr Len/2nd Weight Sex Delivery Anes PTL Lv  2 Term 10/03/09 [redacted]w[redacted]d   M Vag-Spont   LIV  1 Term             Past Medical History:  Diagnosis Date   Anxiety    Cervical cancer screening 04/10/2015   Hx abn Pap 05/2014    Cervical cancer screening 04/10/2015   per record review CIN1 09/18/12 which was observed, 08/11/12 Pap ASCUS (+)HPV, 07/06/13 Pap NILM, 06/19/14 Pap NILM and Neg HPV    Cervical cancer screening 04/10/2015   per record review CIN1 09/18/12 which was observed, 08/11/12 Pap ASCUS (+)HPV, 07/06/13 Pap NILM, 06/19/14 Pap NILM and Neg HPV    Encounter for supervision of normal pregnancy in multigravida 05/13/2015   Moved from [place], [place] > may be going to [place] towards end of pregnancy until baby 6 weeks Clinic  [place] Prenatal Labs Dating  8wk [place] Blood type: O/POS/-- (04/17 1106)  Genetic Screen 1 Screen: NT nml   AFP: Neg     : Antibody:NEG (04/17 1106) Anatomic 04-24-1999 Nml @ 19 wks, limited > rescan in 4-6 wks-full eval of heart unable to obtain> f/u US nml and complete except RVOT not visualized Rubella:    Family history of depression 04/10/2015   both parents    Family history of depression 04/10/2015   both parents    Family history of diabetes mellitus in first degree relative 04/10/2015   mother    Family history of heart disease 04/10/2015   Family history of heart disease 04/10/2015    History of abdominoplasty 04/10/2015   History of breast augmentation 04/10/2015   History of cholecystectomy 04/10/2015   History of goiter 04/10/2015   History of PCOS 04/10/2015   History of tonsillectomy 04/10/2015    Past Surgical History:  Procedure Laterality Date   BREAST ENHANCEMENT SURGERY     CHOLECYSTECTOMY     TONSILLECTOMY     TUMMY TUCK      Current Outpatient Medications on File Prior to Visit  Medication Sig Dispense Refill   Calcium Carb-Cholecalciferol (CALCIUM + D3) 600-200 MG-UNIT TABS Take by mouth daily. Take 1 tablet daily     fluticasone (FLONASE) 50 MCG/ACT nasal spray Place 1 spray into both nostrils 2 (two) times daily.     Magnesium 100 MG CAPS Take 1 capsule by mouth 2 (two) times daily.     MELATONIN PO Take by mouth.     sertraline (ZOLOFT) 50 MG tablet TAKE 1 AND 1/2 TABLETS BY  MOUTH DAILY 135 tablet 3   Fluocinolone Acetonide 0.01 % OIL SMARTSIG:5 Drop(s) Left Ear Once a Week     FOLIC ACID PO Take 1 tablet by mouth daily. Pt takes a folic acid tablet daily, but does not know the strength.     Multiple Vitamin (MULTIVITAMIN)  tablet Take 1 tablet by mouth daily.     No current facility-administered medications on file prior to visit.    No Known Allergies  Social History:  reports that she has never smoked. She has never used smokeless tobacco. She reports previous alcohol use. She reports that she does not use drugs.  Family History  Problem Relation Age of Onset   Depression Mother    Diabetes Mother    Depression Father    Hyperlipidemia Father    Hypertension Father    Cancer Paternal Aunt     The following portions of the patient's history were reviewed and updated as appropriate: allergies, current medications, past family history, past medical history, past social history, past surgical history and problem list.  Review of Systems Pertinent items noted in HPI and remainder of comprehensive ROS otherwise  negative.  Physical Exam:  BP 125/75    Pulse 70    Temp 98.4 F (36.9 C)    Resp 16    Ht 5\' 10"  (1.778 m)    Wt (!) 346 lb (156.9 kg)    LMP 01/20/2019    BMI 49.65 kg/m  CONSTITUTIONAL: Well-developed, well-nourished female in no acute distress.  HENT:  Normocephalic, atraumatic, External right and left ear normal. Oropharynx is clear and moist EYES: Conjunctivae and EOM are normal. Pupils are equal, round, and reactive to light. No scleral icterus.  NECK: Normal range of motion, supple, no masses.  Normal thyroid.  SKIN: Skin is warm and dry. No rash noted. Not diaphoretic. No erythema. No pallor. MUSCULOSKELETAL: Normal range of motion. No tenderness.  No cyanosis, clubbing, or edema.  2+ distal pulses. NEUROLOGIC: Alert and oriented to person, place, and time. Normal reflexes, muscle tone coordination.  PSYCHIATRIC: Normal mood and affect. Normal behavior. Normal judgment and thought content. CARDIOVASCULAR: Normal heart rate noted, regular rhythm RESPIRATORY: Clear to auscultation bilaterally. Effort and breath sounds normal, no problems with respiration noted. BREASTS: Symmetric in size. No masses, tenderness, skin changes, nipple drainage, or lymphadenopathy bilaterally. ABDOMEN: Soft, no distention noted.  No tenderness, rebound or guarding.  PELVIC: Normal appearing external genitalia and urethral meatus; normal appearing vaginal mucosa and cervix.  No abnormal discharge noted.  Pap smear obtained.  Normal uterine size, no other palpable masses, no uterine or adnexal tenderness.   Assessment and Plan:   1. Well woman exam with routine gynecological exam  - Cytology - PAP( [place])   Will follow up results of pap smear and manage accordingly. Routine preventative health maintenance measures emphasized. Please refer to After Visit Summary for other counseling recommendations.     Madlynn Lundeen, Artist Pais, [place] for [employer], [place]

## 2019-03-10 LAB — CYTOLOGY - PAP
Comment: NEGATIVE
Diagnosis: NEGATIVE
High risk HPV: NEGATIVE

## 2019-04-13 ENCOUNTER — Other Ambulatory Visit: Payer: Self-pay | Admitting: Obstetrics and Gynecology

## 2019-04-13 DIAGNOSIS — Z8742 Personal history of other diseases of the female genital tract: Secondary | ICD-10-CM

## 2019-04-13 DIAGNOSIS — R102 Pelvic and perineal pain: Secondary | ICD-10-CM

## 2019-04-13 NOTE — Addendum Note (Signed)
Addended by: Pennie Banter on: 04/13/2019 04:16 PM   Modules accepted: Orders

## 2019-04-14 ENCOUNTER — Ambulatory Visit (INDEPENDENT_AMBULATORY_CARE_PROVIDER_SITE_OTHER): Payer: 59

## 2019-04-14 ENCOUNTER — Other Ambulatory Visit: Payer: Self-pay

## 2019-04-14 DIAGNOSIS — Z8742 Personal history of other diseases of the female genital tract: Secondary | ICD-10-CM

## 2019-04-14 DIAGNOSIS — R102 Pelvic and perineal pain: Secondary | ICD-10-CM | POA: Diagnosis not present

## 2019-04-19 ENCOUNTER — Encounter: Payer: Self-pay | Admitting: Obstetrics and Gynecology

## 2019-04-19 ENCOUNTER — Telehealth (INDEPENDENT_AMBULATORY_CARE_PROVIDER_SITE_OTHER): Payer: 59 | Admitting: Obstetrics and Gynecology

## 2019-04-19 DIAGNOSIS — R9389 Abnormal findings on diagnostic imaging of other specified body structures: Secondary | ICD-10-CM | POA: Diagnosis not present

## 2019-04-19 DIAGNOSIS — F50819 Binge eating disorder, unspecified: Secondary | ICD-10-CM

## 2019-04-19 DIAGNOSIS — F5081 Binge eating disorder: Secondary | ICD-10-CM | POA: Diagnosis not present

## 2019-04-19 NOTE — Progress Notes (Signed)
TELEHEALTH VIRTUAL GYNECOLOGY VISIT ENCOUNTER NOTE  I connected with Regina Andrews on 04/19/19 at 11:10 AM EDT by telephone at home and verified that I am speaking with the correct person using two identifiers.   I discussed the limitations, risks, security and privacy concerns of performing an evaluation and management service by telephone and the availability of in person appointments. I also discussed with the patient that there may be a patient responsible charge related to this service. The patient expressed understanding and agreed to proceed.   History:  Regina Andrews is a 34 y.o. [phone] female being evaluated today for [place] follow up. Abnormal periods/ and pelvic pain for the past year; she feels this is all related to recent weight gain.  [place] was done for these reasons.  Period normally lasts 5-7 days. Periods come irregularly, states she goes months without a period since increased weight gain.  Normally she has no pain with her periods. Last episode of bleeding caused pelvic pain that was more severe than usual.   She attests to a strong family history of endometriosis; mom, gandmother and great grandmother.    She denies any abnormal vaginal discharge, bleeding, pelvic pain or other concerns.     Past Medical History:  Diagnosis Date  . Anxiety   . Cervical cancer screening 04/10/2015   Hx abn Pap 05/2014   . 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 Encounter for supervision of normal pregnancy in multigravida 05/13/2015   Moved from [place], [place] > may be going to [place] towards end of pregnancy until baby 6 weeks Clinic  [place] Prenatal Labs Dating  8wk [place] Blood type: O/POS/-- (04/17 1106)   Genetic Screen 1 Screen: NT nml   AFP: Neg     : Antibody:NEG (04/17 1106) Anatomic [place] Nml @ 19 wks, limited > rescan in 4-6 wks-full eval of heart unable to obtain> f/u US nml and complete except RVOT not visualized Rubella:   . Family history of depression 04/10/2015   both parents   . Family history of depression 04/10/2015   both parents   . Family history of diabetes mellitus in first degree relative 04/10/2015   mother   . Family history of heart disease 04/10/2015  . Family history of heart disease 04/10/2015  . History of abdominoplasty 04/10/2015  . History of breast augmentation 04/10/2015  . History of cholecystectomy 04/10/2015  . History of goiter 04/10/2015  . History of PCOS 04/10/2015  . History of tonsillectomy 04/10/2015   Past Surgical History:  Procedure Laterality Date  . BREAST ENHANCEMENT SURGERY    . CHOLECYSTECTOMY    . TONSILLECTOMY    . TUMMY TUCK     The following portions of the patient's history were reviewed and updated as appropriate: allergies, current medications, past family history, past medical history, past social history, past surgical history and problem list.   Health Maintenance:  Normal pap and negative HRHPV on 03/08/2019.   Review of Systems:  Pertinent items noted in HPI and remainder of comprehensive ROS otherwise negative.  Physical Exam:   General:  Alert, oriented and cooperative.   Mental Status: Normal mood and affect perceived. Normal judgment and thought  content.  Physical exam deferred due to nature of the encounter  Labs and Imaging No results found for this or any previous visit (from the past 336 hour(s)). US Transvaginal Non-OB  Result Date: 04/14/2019 CLINICAL DATA:  Intermittent bilateral pelvic pain for 1 month. Spotting and cramping since Pap smear last month. LMP 03/08/2019. Strong family history of endometrial cancer. EXAM: TRANSABDOMINAL AND TRANSVAGINAL ULTRASOUND OF PELVIS TECHNIQUE: Both transabdominal and transvaginal  ultrasound examinations of the pelvis were performed. Transabdominal technique was performed for global imaging of the pelvis including uterus, ovaries, adnexal regions, and pelvic cul-de-sac. It was necessary to proceed with endovaginal exam following the transabdominal exam to visualize the endometrium and adnexal regions. COMPARISON:  CT of the abdomen and pelvis on 02/27/2019 FINDINGS: Uterus Measurements: 10.3 x 3.9 x 5.4 centimeters = volume: 113 mL. Uterus is mildly heterogeneous without discrete mass. Endometrium Thickness: 16.5.  No focal abnormality visualized. Right ovary Measurements: 3.0 x 2.0 x 2.0 centimeters = volume: 6 mL. Normal appearance/no adnexal mass. Left ovary Measurements: 2.5 x 2.2 x 2.2 centimeters = volume: 6 mL. Normal appearance/no adnexal mass. Other findings No abnormal free fluid. IMPRESSION: 1. Thickened endometrial stripe for premenopausal patient. If bleeding remains unresponsive to hormonal or medical therapy, focal lesion work-up with sonohysterogram should be considered. Endometrial biopsy should also be considered in pre-menopausal patients at high risk for endometrial carcinoma. (Ref: Radiological Reasoning: Algorithmic Workup of Abnormal Vaginal Bleeding with Endovaginal Sonography and Sonohysterography. AJR 2008; 562:Z30-86) 2. Normal appearance of both ovaries. 3. No adnexal mass or free fluid. Electronically Signed   By: Norva Pavlov M.D.   On: 04/14/2019 15:47   US Pelvis Complete  Result Date: 04/14/2019 CLINICAL DATA:  Intermittent bilateral pelvic pain for 1 month. Spotting and cramping since Pap smear last month. LMP 03/08/2019. Strong family history of endometrial cancer. EXAM: TRANSABDOMINAL AND TRANSVAGINAL ULTRASOUND OF PELVIS TECHNIQUE: Both transabdominal and transvaginal ultrasound examinations of the pelvis were performed. Transabdominal technique was performed for global imaging of the pelvis including uterus, ovaries, adnexal regions, and pelvic  cul-de-sac. It was necessary to proceed with endovaginal exam following the transabdominal exam to visualize the endometrium and adnexal regions. COMPARISON:  CT of the abdomen and pelvis on 02/27/2019 FINDINGS: Uterus Measurements: 10.3 x 3.9 x 5.4 centimeters = volume: 113 mL. Uterus is mildly heterogeneous without discrete mass. Endometrium Thickness: 16.5.  No focal abnormality visualized. Right ovary Measurements: 3.0 x 2.0 x 2.0 centimeters = volume: 6 mL. Normal appearance/no adnexal mass. Left ovary Measurements: 2.5 x 2.2 x 2.2 centimeters = volume: 6 mL. Normal appearance/no adnexal mass. Other findings No abnormal free fluid. IMPRESSION: 1. Thickened endometrial stripe for premenopausal patient. If bleeding remains unresponsive to hormonal or medical therapy, focal lesion work-up with sonohysterogram should be considered. Endometrial biopsy should also be considered in pre-menopausal patients at high risk for endometrial carcinoma. (Ref: Radiological Reasoning: Algorithmic Workup of Abnormal Vaginal Bleeding with Endovaginal Sonography and Sonohysterography. AJR 2008; 578:I69-62) 2. Normal appearance of both ovaries. 3. No adnexal mass or free fluid. Electronically Signed   By: Norva Pavlov M.D.   On: 04/14/2019 15:47      Assessment and Plan:   1. Binge eating disorder  - increased weight gain over the last year. She is very motivated to lose weight and change her overall health.  - Refer to eating specialist at [facility] counseling. Contact info given.  - Referred patient to the medical weight loss office here in [place].   2.  Endometrial stripe increased - Reviewed [place] in detail with Marionna.  - She is agreeable to trial Megace daily to see response. Will discuss long term progesterone use like IUD at 3 month f/u.  - Rx: Megace 40 mg daily. Discussed patient and plan of care with Dr. Harolyn Rutherford.    I discussed the assessment and treatment plan with the patient. The patient was  provided an opportunity to ask questions and all were answered. The patient agreed with the plan and demonstrated an understanding of the instructions.   The patient was advised to call back or seek an in-person evaluation/go to the ED if the symptoms worsen or if the condition fails to improve as anticipated.  I provided 25 minutes of non-face-to-face time during this encounter.   Lezlie Lye, NP 04/20/2019 4:41 PM    Noni Saupe, NP [facility], [place]

## 2019-04-20 MED ORDER — MEGESTROL ACETATE 20 MG PO TABS: 40.0000 mg | ORAL_TABLET | Freq: Every day | ORAL | 3 refills | Status: DC

## 2019-05-03 ENCOUNTER — Encounter: Payer: Self-pay | Admitting: Osteopathic Medicine

## 2019-05-03 NOTE — Telephone Encounter (Signed)
I think anyone can manage this acutely, I am happy to consult with Dr. Lyn Hollingshead tomorrow if needed.

## 2019-05-03 NOTE — Telephone Encounter (Signed)
Patient called and scheduled an appointment for tomorrow, she is concerned because she has a family history of gout.   I think this look more like an ortho issue than gout? Dr T does not have any openings today or tomorrow...   Please advise on what you think is best to do next

## 2019-05-04 ENCOUNTER — Encounter: Payer: Self-pay | Admitting: Osteopathic Medicine

## 2019-05-04 ENCOUNTER — Telehealth (INDEPENDENT_AMBULATORY_CARE_PROVIDER_SITE_OTHER): Payer: 59 | Admitting: Osteopathic Medicine

## 2019-05-04 VITALS — Wt 345.0 lb

## 2019-05-04 DIAGNOSIS — M25572 Pain in left ankle and joints of left foot: Secondary | ICD-10-CM

## 2019-05-04 MED ORDER — INDOMETHACIN 50 MG PO CAPS: 50.0000 mg | ORAL_CAPSULE | Freq: Three times a day (TID) | ORAL | 1 refills | Status: DC

## 2019-05-04 MED ORDER — COLCHICINE 0.6 MG PO TABS: 0.6000 mg | ORAL_TABLET | Freq: Two times a day (BID) | ORAL | 2 refills | Status: DC

## 2019-05-04 NOTE — Progress Notes (Signed)
Virtual Visit via Video (App used: MyChart) Note  I connected with      [name] on 05/04/19 at 7:34 AM  by a telemedicine application and verified that I am speaking with the correct person using two identifiers.  Patient is at home I am in office   I discussed the limitations of evaluation and management by telemedicine and the availability of in person appointments. The patient expressed understanding and agreed to proceed.  History of Present Illness: Regina Andrews is a 34 y.o. female who would like to discuss foot/ankle pain    Foot pain in ankle on L Ongoing a week Red,swollen Feels better today but was pretty bad yesterday  No recent injury Remote (>10 yr) hx fracture in that ankle, no surgery          Observations/Objective: Wt (!) 345 lb (156.5 kg)   BMI 49.50 kg/m  BP Readings from Last 3 Encounters:  03/08/19 125/75  02/24/19 138/86  04/14/18 128/87   Exam: Normal Speech.  NAD  Lab and Radiology Results No results found for this or any previous visit (from the past 72 hour(s)). No results found.     Assessment and Plan: 34 y.o. female with The encounter diagnosis was Arthralgia of left ankle.  Gout seems possible but would also be concerned about septic joint The fact it's improving is reassuring  Will try treatment for gout Pt has upcoming apt - will consider get uric acid levels then  ER/RTC precautions advised Sports med referral if not resolving  ER if worse  PDMP not reviewed this encounter. No orders of the defined types were placed in this encounter.  No orders of the defined types were placed in this encounter.    Follow Up Instructions: Return if symptoms worsen or fail to improve.    I discussed the assessment and treatment plan with the patient. The patient was provided an opportunity to ask questions and all were answered. The patient agreed with the plan and demonstrated an understanding of the instructions.     The patient was advised to call back or seek an in-person evaluation if any new concerns, if symptoms worsen or if the condition fails to improve as anticipated.  30 minutes of non-face-to-face time was provided during this encounter.      . . . . . . . . . . . . . [name]                   Historical information moved to improve visibility of documentation.  Past Medical History:  Diagnosis Date  . Anxiety   . Cervical cancer screening 04/10/2015   Hx abn Pap 05/2014   . 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 Encounter for supervision of normal pregnancy in multigravida 05/13/2015   Moved from [place], [place] > may be going to [place] towards end of pregnancy until baby 6 weeks Clinic  [place] Prenatal Labs Dating  8wk [place] Blood type: O/POS/-- (04/17 1106)  Genetic Screen 1 Screen: NT nml   AFP: Neg     : Antibody:NEG (04/17 1106) Anatomic [place] Nml @ 19 wks, limited > rescan in 4-6 wks-full eval  of heart unable to obtain> f/u US nml and complete except RVOT not visualized Rubella:   . Family history of depression 04/10/2015   both parents   . Family history of depression 04/10/2015   both parents   . Family history of diabetes mellitus in first degree relative 04/10/2015   mother   . Family history of heart disease 04/10/2015  . Family history of heart disease 04/10/2015  . History of abdominoplasty 04/10/2015  . History of breast augmentation 04/10/2015  . History of cholecystectomy 04/10/2015  . History of goiter 04/10/2015  . History of PCOS 04/10/2015  . History of tonsillectomy 04/10/2015   Past Surgical History:  Procedure Laterality Date  . BREAST ENHANCEMENT SURGERY    .  CHOLECYSTECTOMY    . TONSILLECTOMY    . TUMMY TUCK     Social History   Tobacco Use  . Smoking status: Never Smoker  . Smokeless tobacco: Never Used  Substance Use Topics  . Alcohol use: Not Currently    Alcohol/week: 0.0 standard drinks   family history includes Cancer in her paternal aunt; Depression in her father and mother; Diabetes in her mother; Hyperlipidemia in her father; Hypertension in her father.  Medications: Current Outpatient Medications  Medication Sig Dispense Refill  . Calcium Carb-Cholecalciferol (CALCIUM + D3) 600-200 MG-UNIT TABS Take by mouth daily. Take 1 tablet daily    . Fluocinolone Acetonide 0.01 % OIL SMARTSIG:5 Drop(s) Left Ear Once a Week    . fluticasone (FLONASE) 50 MCG/ACT nasal spray Place 1 spray into both nostrils 2 (two) times daily.    [name] FOLIC ACID PO Take 1 tablet by mouth daily. Pt takes a folic acid tablet daily, but does not know the strength.    . Magnesium 100 MG CAPS Take 1 capsule by mouth 2 (two) times daily.    . megestrol (MEGACE) 20 MG tablet Take 2 tablets (40 mg total) by mouth daily. 120 tablet 3  . MELATONIN PO Take by mouth.    . Multiple Vitamin (MULTIVITAMIN) tablet Take 1 tablet by mouth daily.    . sertraline (ZOLOFT) 50 MG tablet TAKE 1 AND 1/2 TABLETS BY  MOUTH DAILY 135 tablet 3   No current facility-administered medications for this visit.   No Known Allergies

## 2019-05-25 ENCOUNTER — Encounter: Payer: Self-pay | Admitting: Osteopathic Medicine

## 2019-05-25 ENCOUNTER — Ambulatory Visit: Payer: 59 | Admitting: Osteopathic Medicine

## 2019-05-25 ENCOUNTER — Other Ambulatory Visit: Payer: Self-pay

## 2019-05-25 VITALS — BP 148/76 | HR 81 | Temp 98.2°F | Wt 331.0 lb

## 2019-05-25 DIAGNOSIS — R7309 Other abnormal glucose: Secondary | ICD-10-CM

## 2019-05-25 NOTE — Progress Notes (Signed)
Regina Andrews is a 34 y.o. female who presents to  [facility] at [facility]  today, 05/26/19, seeking care for the following: . Elevated A1C  . Medial weight loss management - she's doing really well! Eating healthier, changing her relationship with food, modeling better dietary habits for her kids, no strict calorie counting but monitoring to keep about 2000 cal/day for now.      ASSESSMENT & PLAN with other pertinent history/findings:  The encounter diagnosis was Elevated hemoglobin A1c measurement.  Component     Latest Ref Rng & Units 08/05/2016 10/11/2017 02/27/2019  Hemoglobin A1C     <5.7 % of total Hgb 5.1 5.5 6.0 (H)  Mean Plasma Glucose     (calc) 100 111 126  eAG (mmol/L)     (calc)  6.2 7.0   Wt Readings from Last 3 Encounters:  05/25/19 (!) 331 lb (150.1 kg)  05/04/19 (!) 345 lb (156.5 kg)  03/08/19 (!) 346 lb (156.9 kg)         There are no Patient Instructions on file for this visit.   No orders of the defined types were placed in this encounter.   No orders of the defined types were placed in this encounter.      Follow-up instructions: Return in about 2 weeks (around 06/08/2019) for NURSE VISIT - CHECK A1C THURSDAY .                                         BP (!) 148/76 (BP Location: Left Arm, Patient Position: Sitting, Cuff Size: Large)   Pulse 81   Temp 98.2 F (36.8 C) (Oral)   Wt (!) 331 lb (150.1 kg)   BMI 47.49 kg/m   Current Meds  Medication Sig  . Calcium Carb-Cholecalciferol (CALCIUM + D3) 600-200 MG-UNIT TABS Take by mouth daily. Take 1 tablet daily  . colchicine 0.6 MG tablet Take 1 tablet (0.6 mg total) by mouth 2 (two) times daily.  . Fluocinolone Acetonide 0.01 % OIL SMARTSIG:5 Drop(s) Left Ear Once a Week  . fluticasone (FLONASE) 50 MCG/ACT nasal spray Place 1 spray into both nostrils 2 (two) times daily.  [name] FOLIC ACID PO Take 1 tablet by mouth  daily. Pt takes a folic acid tablet daily, but does not know the strength.  . indomethacin (INDOCIN) 50 MG capsule Take 1 capsule (50 mg total) by mouth 3 (three) times daily with meals. TAKE 3 TIMES PER DAY WHILE PAIN LASTS, THEN DECREASE TO 1 - 2 TIMES PER DAY AS NEEDED  . Magnesium 100 MG CAPS Take 1 capsule by mouth 2 (two) times daily.  . megestrol (MEGACE) 20 MG tablet Take 2 tablets (40 mg total) by mouth daily.  [name] MELATONIN PO Take by mouth.  . Multiple Vitamin (MULTIVITAMIN) tablet Take 1 tablet by mouth daily.  . sertraline (ZOLOFT) 50 MG tablet TAKE 1 AND 1/2 TABLETS BY  MOUTH DAILY    No results found for this or any previous visit (from the past 72 hour(s)).  No results found.  Depression screen [facility] 2/9 06/13/2018 10/11/2017 08/05/2016  Decreased Interest 2 0 0  Down, Depressed, Hopeless 1 0 1  PHQ - 2 Score 3 0 1  Altered sleeping 3 2 0  Tired, decreased energy 3 2 1   Change in appetite 3 2 2   Feeling bad or failure about yourself  2 0 0  Trouble concentrating 1 1 0  Moving slowly or fidgety/restless 0 0 0  Suicidal thoughts 0 0 0  PHQ-9 Score 15 7 4   Difficult doing work/chores Somewhat difficult Somewhat difficult -    GAD 7 : Generalized Anxiety Score 06/13/2018 10/11/2017 08/05/2016 02/06/2016  Nervous, Anxious, on Edge 2 1 1 1   Control/stop worrying 1 0 1 0  Worry too much - different things 1 0 1 0  Trouble relaxing 2 0 0 1  Restless 0 0 0 1  Easily annoyed or irritable 3 1 0 1  Afraid - awful might happen 0 0 0 0  Total GAD 7 Score 9 2 3 4   Anxiety Difficulty Not difficult at all Not difficult at all - -      All questions at time of visit were answered - patient instructed to contact office with any additional concerns or updates.  ER/RTC precautions were reviewed with the patient.  Please note: voice recognition software was used to produce this document, and typos may escape review. Please contact Dr. Sheppard Coil for any needed clarifications.

## 2019-06-08 ENCOUNTER — Ambulatory Visit (INDEPENDENT_AMBULATORY_CARE_PROVIDER_SITE_OTHER): Payer: 59 | Admitting: Osteopathic Medicine

## 2019-06-08 ENCOUNTER — Other Ambulatory Visit: Payer: Self-pay

## 2019-06-08 VITALS — BP 125/82 | HR 75 | Temp 98.2°F | Wt 324.0 lb

## 2019-06-08 DIAGNOSIS — R7309 Other abnormal glucose: Secondary | ICD-10-CM

## 2019-06-08 DIAGNOSIS — R7303 Prediabetes: Secondary | ICD-10-CM

## 2019-06-08 DIAGNOSIS — E8881 Metabolic syndrome: Secondary | ICD-10-CM

## 2019-06-08 LAB — POCT GLYCOSYLATED HEMOGLOBIN (HGB A1C): Hemoglobin A1C: 5.4 % (ref 4.0–5.6)

## 2019-06-08 MED ORDER — OZEMPIC (0.25 OR 0.5 MG/DOSE) 2 MG/1.5ML ~~LOC~~ SOPN: 0.5000 mg | PEN_INJECTOR | SUBCUTANEOUS | 3 refills | Status: DC

## 2019-06-08 NOTE — Progress Notes (Signed)
Pt presented is here for a recheck on A1c. POCT completed. Provider notified of results. Per provider - Ozempic rx will be sent to local pharmacy on file. Pt informed of how to use medication, aware of side effects. Pt informed to follow up with provider as instructed.

## 2019-06-12 ENCOUNTER — Telehealth: Payer: Self-pay | Admitting: Osteopathic Medicine

## 2019-06-12 NOTE — Telephone Encounter (Signed)
Received fax for PA on Ozempic sent through cover my meds waiting on determination. - CF °

## 2019-06-13 NOTE — Telephone Encounter (Signed)
Received fax from [facility] they denied coverage on Ozempic due to patient must be diagnosed with Diabetes mellitus and have a intolerance or failure to glucophage, glucophage xr. Placing in providers box for review. - CF

## 2019-06-15 NOTE — Telephone Encounter (Signed)
That's fine! Will address at next visit.

## 2019-06-15 NOTE — Telephone Encounter (Signed)
I called and spoke with patient about trying Metformin and she does not want to she stated she doesn't really like taking medications and would like to try and get her sugar down on her own but she will talk to you about it at her next appointment. - CF

## 2019-06-16 ENCOUNTER — Encounter: Payer: Self-pay | Admitting: Osteopathic Medicine

## 2019-06-26 ENCOUNTER — Other Ambulatory Visit: Payer: Self-pay | Admitting: Osteopathic Medicine

## 2019-06-26 DIAGNOSIS — F411 Generalized anxiety disorder: Secondary | ICD-10-CM

## 2019-06-30 ENCOUNTER — Ambulatory Visit: Payer: 59 | Admitting: Obstetrics and Gynecology

## 2019-06-30 ENCOUNTER — Other Ambulatory Visit: Payer: Self-pay

## 2019-06-30 ENCOUNTER — Encounter: Payer: Self-pay | Admitting: Obstetrics and Gynecology

## 2019-06-30 VITALS — BP 132/87 | HR 77 | Resp 16 | Ht 70.0 in | Wt 318.0 lb

## 2019-06-30 DIAGNOSIS — N939 Abnormal uterine and vaginal bleeding, unspecified: Secondary | ICD-10-CM | POA: Diagnosis not present

## 2019-06-30 DIAGNOSIS — Z8742 Personal history of other diseases of the female genital tract: Secondary | ICD-10-CM | POA: Diagnosis not present

## 2019-06-30 MED ORDER — MEGESTROL ACETATE 20 MG PO TABS: 40.0000 mg | ORAL_TABLET | Freq: Two times a day (BID) | ORAL | 3 refills | Status: DC

## 2019-06-30 NOTE — Progress Notes (Signed)
GYNECOLOGY ENCOUNTER NOTE  History:     Regina Andrews is a 34 y.o. [phone] female here for f/u from previous visit for abnormal uterine bleeding. She was placed on daily Megace 40 mg and started this in April d/t thickened endometrial lining seen on [place].  She had a heavy period in May which was the first period she has had in 6 months. The period was heavy, with passing of large tissue and clots. She has no pain right now. Bleeding continues, however is not as heavy. She is leaving for out of town next week and wants to control the bleeding. She is agreeable to an IUD, however will need to schedule this.   Menstrual bleeding started May 17 Her last dose of Megace was June 2 Large Heavy clots on June 1, bleeding now however much lighter now.   She is on a weight loss plan and is doing intermittent fasting. She has lost 30+ LBS.  A1C is down, this is managed by PCP.   Gynecologic History No LMP recorded. Contraception: none Last Pap: 03/08/19 Results were: normal with negative HPV   Obstetric History OB History  Gravida Para Term Preterm AB Living  2 2 2     2   SAB TAB Ectopic Multiple Live Births          2    # Outcome Date GA Lbr Len/2nd Weight Sex Delivery Anes PTL Lv  2 Term 10/03/09 [redacted]w[redacted]d   M Vag-Spont   LIV  1 Term             Past Medical History:  Diagnosis Date  . Anxiety   . Cervical cancer screening 04/10/2015   Hx abn Pap 05/2014   . 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 Encounter for supervision of normal pregnancy in multigravida 05/13/2015   Moved from [place], [place] > may be going to [place] towards end of pregnancy until baby 6 weeks Clinic  [place] Prenatal Labs Dating  8wk  [place] Blood type: O/POS/-- (04/17 1106)  Genetic Screen 1 Screen: NT nml   AFP: Neg     : Antibody:NEG (04/17 1106) Anatomic [place] Nml @ 19 wks, limited > rescan in 4-6 wks-full eval of heart unable to obtain> f/u US nml and complete except RVOT not visualized Rubella:   . Family history of depression 04/10/2015   both parents   . Family history of depression 04/10/2015   both parents   . Family history of diabetes mellitus in first degree relative 04/10/2015   mother   . Family history of heart disease 04/10/2015  . Family history of heart disease 04/10/2015  . History of abdominoplasty 04/10/2015  . History of breast augmentation 04/10/2015  . History of cholecystectomy 04/10/2015  . History of goiter 04/10/2015  . History of PCOS 04/10/2015  . History of tonsillectomy 04/10/2015    Past Surgical History:  Procedure Laterality Date  . BREAST ENHANCEMENT SURGERY    . CHOLECYSTECTOMY    . TONSILLECTOMY    . TUMMY TUCK      Current Outpatient Medications on File Prior to Visit  Medication Sig Dispense Refill  . Fluocinolone Acetonide 0.01 % OIL SMARTSIG:5  Drop(s) Left Ear Once a Week    . fluticasone (FLONASE) 50 MCG/ACT nasal spray Place 1 spray into both nostrils 2 (two) times daily.    [name] ibuprofen (ADVIL) 200 MG tablet Take 200 mg by mouth every 6 (six) hours as needed.    . indomethacin (INDOCIN) 50 MG capsule Take 1 capsule (50 mg total) by mouth 3 (three) times daily with meals. TAKE 3 TIMES PER DAY WHILE PAIN LASTS, THEN DECREASE TO 1 - 2 TIMES PER DAY AS NEEDED 30 capsule 1  . Magnesium 100 MG CAPS Take 1 capsule by mouth 2 (two) times daily.    [name] MELATONIN PO Take by mouth.    . Multiple Vitamin (MULTIVITAMIN) tablet Take 1 tablet by mouth daily.    . sertraline (ZOLOFT) 50 MG tablet TAKE 1 AND 1/2 TABLETS BY  MOUTH DAILY 135 tablet 3  . Calcium Carb-Cholecalciferol (CALCIUM + D3) 600-200 MG-UNIT TABS Take by mouth daily. Take 1 tablet daily    . colchicine 0.6 MG tablet Take 1 tablet  (0.6 mg total) by mouth 2 (two) times daily. (Patient not taking: Reported on 06/30/2019) 60 tablet 2  . FOLIC ACID PO Take 1 tablet by mouth daily. Pt takes a folic acid tablet daily, but does not know the strength.    . Semaglutide,0.25 or 0.5MG /DOS, (OZEMPIC, 0.25 OR 0.5 MG/DOSE,) 2 MG/1.5ML SOPN Inject 0.5 mg into the skin once a week. If nausea develops, use 0.25 mg dose for 2-4 weeks then back up to the 0.5 mg dose (Patient not taking: Reported on 06/30/2019) 1 pen 3   No current facility-administered medications on file prior to visit.    No Known Allergies  Social History:  reports that she has never smoked. She has never used smokeless tobacco. She reports previous alcohol use. She reports that she does not use drugs.  Family History  Problem Relation Age of Onset  . Depression Mother   . Diabetes Mother   . Depression Father   . Hyperlipidemia Father   . Hypertension Father   . Cancer Paternal Aunt     The following portions of the patient's history were reviewed and updated as appropriate: allergies, current medications, past family history, past medical history, past social history, past surgical history and problem list.  Review of Systems Pertinent items noted in HPI and remainder of comprehensive ROS otherwise negative.  Physical Exam:  BP 132/87   Pulse 77   Resp 16   Ht 5\' 10"  (1.778 m)   Wt (!) 318 lb (144.2 kg)   BMI 45.63 kg/m  CONSTITUTIONAL: Well-developed, well-nourished female in no acute distress.  HENT:  Normocephalic, atraumatic, External right and left ear normal.  EYES: Conjunctivae and EOM are normal. Pupils are equal, round, and reactive to light.   NECK: Normal range of motion, supple, no masses.  Normal thyroid.  SKIN: Skin is warm and dry.  MUSCULOSKELETAL: Normal range of motion. NEUROLOGIC: Alert and oriented to person, place, and time.  PSYCHIATRIC: Normal mood and affect. Normal behavior. Normal judgment and thought content.  Assessment and  Plan:   1. History of PCOS   2. Abnormal uterine bleeding (AUB)  She is without dizziness Bleeding is much less now Increase Megace to 40 mg BID Scheduled appt for IUD insertion when you return from trip. She is scheduled for July 2021 Ibuprofen daily for bleeding.  Call the office if heavy bleeding continues.     Mikell Camp, August 2021, NP [facility]  [facility], [place] [facility]

## 2019-07-02 DIAGNOSIS — N939 Abnormal uterine and vaginal bleeding, unspecified: Secondary | ICD-10-CM | POA: Insufficient documentation

## 2019-08-09 ENCOUNTER — Ambulatory Visit: Payer: 59 | Admitting: Obstetrics and Gynecology

## 2019-09-04 ENCOUNTER — Other Ambulatory Visit: Payer: Self-pay | Admitting: Obstetrics and Gynecology

## 2019-09-04 MED ORDER — DIAZEPAM 2 MG PO TABS: 2.0000 mg | ORAL_TABLET | Freq: Once | ORAL | 0 refills | Status: AC

## 2019-09-04 MED ORDER — CYCLOBENZAPRINE HCL 10 MG PO TABS: 10.0000 mg | ORAL_TABLET | Freq: Once | ORAL | 0 refills | Status: AC

## 2019-09-04 NOTE — Progress Notes (Signed)
Regina Andrews called the office requesting medication prior to her IUD insertion on Wednesday.  She is scheduled at 0810 on 09/06/19.  Rx: Valium 2 mg & Flexeril 10 mg PO. Patient's husband is driving her to her appointment.    Duane Lope, NP 09/04/2019 5:18 PM

## 2019-09-06 ENCOUNTER — Encounter: Payer: Self-pay | Admitting: Obstetrics and Gynecology

## 2019-09-06 ENCOUNTER — Ambulatory Visit: Payer: 59 | Admitting: Obstetrics and Gynecology

## 2019-09-06 ENCOUNTER — Other Ambulatory Visit: Payer: Self-pay

## 2019-09-06 VITALS — BP 125/89 | HR 82 | Resp 16 | Ht 70.0 in | Wt 310.0 lb

## 2019-09-06 DIAGNOSIS — Z30430 Encounter for insertion of intrauterine contraceptive device: Secondary | ICD-10-CM

## 2019-09-06 DIAGNOSIS — Z01812 Encounter for preprocedural laboratory examination: Secondary | ICD-10-CM

## 2019-09-06 LAB — POCT URINE PREGNANCY: Preg Test, Ur: NEGATIVE

## 2019-09-06 MED ORDER — LEVONORGESTREL 20 MCG/24HR IU IUD: INTRAUTERINE_SYSTEM | Freq: Once | INTRAUTERINE | Status: AC

## 2019-09-06 NOTE — Patient Instructions (Signed)

## 2019-09-06 NOTE — Progress Notes (Signed)
   GYNECOLOGY CLINIC PROCEDURE NOTE  Regina Andrews is a 34 y.o. [id number] here for Mirena IUD insertion. No GYN concerns.  Last pap smear was on 03/08/19 and was normal.  IUD Insertion Procedure Note Patient identified, informed consent performed, consent signed.   Discussed risks of irregular bleeding, cramping, infection, malpositioning or misplacement of the IUD outside the uterus which may require further procedure such as laparoscopy. Time out was performed.  Urine pregnancy test negative.  Speculum placed in the vagina.  Cervix visualized.  Cleaned with Betadine x 2.  Grasped anteriorly with a single tooth tenaculum.  Uterus sounded to 7 cm.  Mirena IUD placed per manufacturer's recommendations.  Strings trimmed to 3 cm. Tenaculum was removed, good hemostasis noted.  Patient tolerated procedure well.   Patient was given post-procedure instructions.  She was advised to have backup contraception for one week.  Patient was also asked to check IUD strings periodically and follow up in 4 weeks for IUD check.   Venia Carbon I, NP 09/06/2019 8:24 AM

## 2019-09-07 ENCOUNTER — Ambulatory Visit (INDEPENDENT_AMBULATORY_CARE_PROVIDER_SITE_OTHER): Payer: 59 | Admitting: *Deleted

## 2019-09-07 VITALS — BP 124/82 | HR 79

## 2019-09-07 DIAGNOSIS — N920 Excessive and frequent menstruation with regular cycle: Secondary | ICD-10-CM

## 2019-09-07 NOTE — Progress Notes (Signed)
Pt called stating that her IUD fell out last night with a huge clot.  This IUD was placed in our office yesterday around 8:30.  After the IUD fell out pt states that she had 2 episodes of feeling faint, sweating and ringing in her ears.  She states that she has been bleeding for 3 months.  Today she comes in and BP is 124/82 and CBC is drawn.  She is to reschedule another IUD insertion on 8/25.  Venia Carbon, NP is to send in a RF on anti anxiety meds before her next appt.

## 2019-09-08 LAB — CBC
HCT: 38.7 % (ref 35.0–45.0)
Hemoglobin: 12 g/dL (ref 11.7–15.5)
MCH: 27.8 pg (ref 27.0–33.0)
MCHC: 31 g/dL — ABNORMAL LOW (ref 32.0–36.0)
MCV: 89.8 fL (ref 80.0–100.0)
MPV: 9.8 fL (ref 7.5–12.5)
Platelets: 368 10*3/uL (ref 140–400)
RBC: 4.31 10*6/uL (ref 3.80–5.10)
RDW: 12.9 % (ref 11.0–15.0)
WBC: 8.6 10*3/uL (ref 3.8–10.8)

## 2019-09-15 ENCOUNTER — Other Ambulatory Visit: Payer: Self-pay | Admitting: Obstetrics and Gynecology

## 2019-09-15 MED ORDER — CYCLOBENZAPRINE HCL 10 MG PO TABS: 10.0000 mg | ORAL_TABLET | Freq: Once | ORAL | 0 refills | Status: AC

## 2019-09-15 MED ORDER — DIAZEPAM 2 MG PO TABS: 2.0000 mg | ORAL_TABLET | Freq: Once | ORAL | 0 refills | Status: AC

## 2019-09-20 ENCOUNTER — Other Ambulatory Visit: Payer: Self-pay

## 2019-09-20 ENCOUNTER — Ambulatory Visit: Payer: 59 | Admitting: Obstetrics and Gynecology

## 2019-09-20 ENCOUNTER — Encounter: Payer: Self-pay | Admitting: Obstetrics and Gynecology

## 2019-09-20 VITALS — BP 126/78 | HR 67 | Resp 16 | Ht 70.0 in | Wt 313.0 lb

## 2019-09-20 DIAGNOSIS — T8339XA Other mechanical complication of intrauterine contraceptive device, initial encounter: Secondary | ICD-10-CM | POA: Diagnosis not present

## 2019-09-20 DIAGNOSIS — T8383XA Hemorrhage of genitourinary prosthetic devices, implants and grafts, initial encounter: Secondary | ICD-10-CM

## 2019-09-20 DIAGNOSIS — Z30430 Encounter for insertion of intrauterine contraceptive device: Secondary | ICD-10-CM | POA: Diagnosis not present

## 2019-09-20 DIAGNOSIS — Z01812 Encounter for preprocedural laboratory examination: Secondary | ICD-10-CM

## 2019-09-20 LAB — POCT URINE PREGNANCY: Preg Test, Ur: NEGATIVE

## 2019-09-20 MED ORDER — LEVONORGESTREL 20 MCG/24HR IU IUD: INTRAUTERINE_SYSTEM | Freq: Once | INTRAUTERINE | Status: AC

## 2019-09-20 NOTE — Progress Notes (Signed)
   GYNECOLOGY CLINIC PROCEDURE NOTE  Regina Andrews is a 34 y.o. [id number] here for  IUD re-insertion. Hx of abnormal uterine bleeding.  I attempted to place IUD on 8/11, 24 hours after placement she experienced heavy bleeding with clots and saw her IUD in a blood clot.  Last US showed endometrial thickness of 16.    IUD Insertion Procedure Note Patient identified, informed consent performed, consent signed.   Discussed risks of irregular bleeding, cramping, infection, malpositioning or misplacement of the IUD outside the uterus which may require further procedure such as laparoscopy. Time out was performed.  Urine pregnancy test negative.  Speculum placed in the vagina.  Cervix visualized.  Cleaned with Betadine x 2.  Grasped anteriorly with a single tooth tenaculum.  Uterus sounded to 8 cm.  Mirena  IUD placed per manufacturer's recommendations.  Strings trimmed to 3 cm. Tenaculum was removed, good hemostasis noted.  Patient tolerated procedure well.   Patient was given post-procedure instructions.  She was advised to have backup contraception for one week.  Patient was also asked to check IUD strings periodically and follow up in 4 weeks for IUD check.  Regina Andrews I, NP.  09/20/2019 10:39 AM

## 2019-10-06 ENCOUNTER — Ambulatory Visit: Payer: 59 | Admitting: Obstetrics and Gynecology

## 2019-10-18 ENCOUNTER — Encounter: Payer: Self-pay | Admitting: Obstetrics and Gynecology

## 2019-10-18 ENCOUNTER — Other Ambulatory Visit (HOSPITAL_COMMUNITY)
Admission: RE | Admit: 2019-10-18 | Discharge: 2019-10-18 | Disposition: A | Discharge status: Home / Self Care | Payer: 59 | Source: Ambulatory Visit | Attending: Obstetrics and Gynecology | Admitting: Obstetrics and Gynecology

## 2019-10-18 ENCOUNTER — Ambulatory Visit (INDEPENDENT_AMBULATORY_CARE_PROVIDER_SITE_OTHER): Payer: 59 | Admitting: Obstetrics and Gynecology

## 2019-10-18 ENCOUNTER — Other Ambulatory Visit: Payer: Self-pay

## 2019-10-18 VITALS — BP 116/85 | HR 78 | Ht 70.0 in | Wt 314.0 lb

## 2019-10-18 DIAGNOSIS — R519 Headache, unspecified: Secondary | ICD-10-CM | POA: Diagnosis not present

## 2019-10-18 DIAGNOSIS — Z30431 Encounter for routine checking of intrauterine contraceptive device: Secondary | ICD-10-CM | POA: Diagnosis not present

## 2019-10-18 DIAGNOSIS — N898 Other specified noninflammatory disorders of vagina: Secondary | ICD-10-CM | POA: Insufficient documentation

## 2019-10-18 MED ORDER — BUTALBITAL-APAP-CAFFEINE 50-325-40 MG PO TABS: 1.0000 | ORAL_TABLET | Freq: Four times a day (QID) | ORAL | 0 refills | Status: DC | PRN

## 2019-10-18 NOTE — Addendum Note (Signed)
Addended by: Kathie Dike on: 10/18/2019 10:21 AM   Modules accepted: Orders

## 2019-10-18 NOTE — Progress Notes (Signed)
    GYNECOLOGY OFFICE ENCOUNTER NOTE  History:  34 y.o. [id number] here today for today for IUD string check; Mirena  IUD was placed  09/06/2019. She reports worsening HA's since IUD was placed. Hx of HA's however these feel worse. At times feels like she cannot get her words out right. Like her words are not coming out of her mouth right.    The following portions of the patient's history were reviewed and updated as appropriate: allergies, current medications, past family history, past medical history, past social history, past surgical history and problem list. Last pap smear on 03/08/19 was normal, negative HRHPV.  Review of Systems:  Pertinent items are noted in HPI.   Objective:  Physical Exam Blood pressure 116/85, pulse 78, height 5\' 10"  (1.778 m), weight (!) 314 lb (142.4 kg). CONSTITUTIONAL: Well-developed, well-nourished female in no acute distress.  HENT:  Normocephalic, atraumatic. External right and left ear normal. Oropharynx is clear and moist EYES: Conjunctivae and EOM are normal. Pupils are equal, round, and reactive to light. No scleral icterus.  NECK: Normal range of motion, supple, no masses CARDIOVASCULAR: Normal heart rate noted RESPIRATORY: Effort and breath sounds normal, no problems with respiration noted ABDOMEN: Soft, no distention noted.   PELVIC: Normal appearing external genitalia; normal appearing vaginal mucosa and cervix.  IUD strings visualized, about 2 cm in length outside cervix.   Assessment & Plan:  Patient to keep IUD in place for up to seven years; can come in for removal if she desires pregnancy earlier or for any concerning side effects.  No concerning neuro symptoms today. Recommend Covid-19 antibody testing. [employer] sells them for at home use Rx: Fioricet F/u in 6 weeks to evaluate symptoms since IUD placed.    Tonnette Zwiebel, , NP [facility], [facility]

## 2019-10-19 ENCOUNTER — Other Ambulatory Visit: Payer: Self-pay | Admitting: Obstetrics and Gynecology

## 2019-10-19 LAB — CERVICOVAGINAL ANCILLARY ONLY
Bacterial Vaginitis (gardnerella): POSITIVE — AB
Candida Glabrata: NEGATIVE
Candida Vaginitis: NEGATIVE
Chlamydia: NEGATIVE
Comment: NEGATIVE
Comment: NEGATIVE
Comment: NEGATIVE
Comment: NEGATIVE
Comment: NEGATIVE
Comment: NORMAL
Neisseria Gonorrhea: NEGATIVE
Trichomonas: NEGATIVE

## 2019-10-19 MED ORDER — METRONIDAZOLE 500 MG PO TABS: 500.0000 mg | ORAL_TABLET | Freq: Two times a day (BID) | ORAL | 0 refills | Status: AC

## 2019-11-06 ENCOUNTER — Other Ambulatory Visit: Payer: Self-pay

## 2019-11-06 ENCOUNTER — Ambulatory Visit: Payer: 59 | Admitting: Osteopathic Medicine

## 2019-11-06 ENCOUNTER — Other Ambulatory Visit (INDEPENDENT_AMBULATORY_CARE_PROVIDER_SITE_OTHER): Payer: 59

## 2019-11-06 VITALS — BP 112/79 | HR 70 | Wt 315.0 lb

## 2019-11-06 DIAGNOSIS — Z01419 Encounter for gynecological examination (general) (routine) without abnormal findings: Secondary | ICD-10-CM

## 2019-11-06 DIAGNOSIS — G5710 Meralgia paresthetica, unspecified lower limb: Secondary | ICD-10-CM

## 2019-11-06 DIAGNOSIS — R413 Other amnesia: Secondary | ICD-10-CM

## 2019-11-06 DIAGNOSIS — R202 Paresthesia of skin: Secondary | ICD-10-CM | POA: Diagnosis not present

## 2019-11-06 DIAGNOSIS — R7303 Prediabetes: Secondary | ICD-10-CM

## 2019-11-06 NOTE — Progress Notes (Signed)
Regina Andrews is a 34 y.o. female who presents to  [facility] at [facility]  today, 11/06/19, seeking care for the following:  . Occasional numbness/tingling in arms and thighs.     ASSESSMENT & PLAN with other pertinent findings:  The primary encounter diagnosis was Paresthesias. Diagnoses of Meralgia paresthetica, unspecified laterality and Prediabetes were also pertinent to this visit.   Of note, symptoms were worse when she was a bit heavier, has been back to diligent healthy diet and increased exercise but made the appointment a couple of weeks ago.  At this point, symptoms consistent with meralgia paresthetica and possible mild cervical radiculopathy.  Spurling's test negative bilaterally, straight leg test negative bilaterally.  Normal grip strength bilaterally. no other concerns on physical exam.  Patient had some questions about whether sugar/prediabetes might be a factor, her mother had also mentioned that nerve problems can become permanent.  I do not think that she is at risk for anything serious, no red flags for significant neurological problem at this point but would certainly have a low threshold for reevaluation/MRI    There are no Patient Instructions on file for this visit.  Orders Placed This Encounter  Procedures  . CBC  . COMPLETE METABOLIC PANEL WITH GFR  . TSH  . Vitamin B12  . Hemoglobin A1c    No orders of the defined types were placed in this encounter.      Follow-up instructions: Return for KEEP CURRENTLY SCHEDULED APPOINTMENT.                                         BP 112/79 (BP Location: Left Arm, Patient Position: Sitting)   Pulse 70   Wt (!) 315 lb (142.9 kg)   SpO2 100%   BMI 45.20 kg/m   Current Meds  Medication Sig  . Multiple Vitamin (MULTIVITAMIN) tablet Take 1 tablet by mouth daily.  . sertraline (ZOLOFT) 50 MG tablet TAKE 1 AND 1/2 TABLETS BY   MOUTH DAILY    No results found for this or any previous visit (from the past 72 hour(s)).  No results found.     All questions at time of visit were answered - patient instructed to contact office with any additional concerns or updates.  ER/RTC precautions were reviewed with the patient as applicable.   Please note: voice recognition software was used to produce this document, and typos may escape review. Please contact Dr. Lyn Hollingshead for any needed clarifications.   Total encounter time: 30 minutes.

## 2019-11-06 NOTE — Progress Notes (Signed)
Pt saw Awilda Bill, NP on 9/22 and was offered COVID antibody testing. Pt here today for lab papers.

## 2019-11-06 NOTE — Progress Notes (Signed)
Pt given lab orders and sent to lab 

## 2019-11-07 LAB — COMPLETE METABOLIC PANEL WITH GFR
AG Ratio: 1.6 (calc) (ref 1.0–2.5)
ALT: 18 U/L (ref 6–29)
AST: 12 U/L (ref 10–30)
Albumin: 4.5 g/dL (ref 3.6–5.1)
Alkaline phosphatase (APISO): 90 U/L (ref 31–125)
BUN: 10 mg/dL (ref 7–25)
CO2: 31 mmol/L (ref 20–32)
Calcium: 9.9 mg/dL (ref 8.6–10.2)
Chloride: 99 mmol/L (ref 98–110)
Creat: 0.69 mg/dL (ref 0.50–1.10)
GFR, Est African American: 130 mL/min/{1.73_m2} (ref >=60)
GFR, Est Non African American: 112 mL/min/{1.73_m2} (ref >=60)
Globulin: 2.8 g/dL (calc) (ref 1.9–3.7)
Glucose, Bld: 99 mg/dL (ref 65–99)
Potassium: 4 mmol/L (ref 3.5–5.3)
Sodium: 137 mmol/L (ref 135–146)
Total Bilirubin: 0.4 mg/dL (ref 0.2–1.2)
Total Protein: 7.3 g/dL (ref 6.1–8.1)

## 2019-11-07 LAB — CBC
HCT: 38.4 % (ref 35.0–45.0)
Hemoglobin: 12.4 g/dL (ref 11.7–15.5)
MCH: 27.8 pg (ref 27.0–33.0)
MCHC: 32.3 g/dL (ref 32.0–36.0)
MCV: 86.1 fL (ref 80.0–100.0)
MPV: 10 fL (ref 7.5–12.5)
Platelets: 358 10*3/uL (ref 140–400)
RBC: 4.46 10*6/uL (ref 3.80–5.10)
RDW: 13.8 % (ref 11.0–15.0)
WBC: 6.6 10*3/uL (ref 3.8–10.8)

## 2019-11-07 LAB — HEMOGLOBIN A1C
Hgb A1c MFr Bld: 5.2 % of total Hgb (ref <5.7)
Mean Plasma Glucose: 103 (calc)
eAG (mmol/L): 5.7 (calc)

## 2019-11-07 LAB — VITAMIN B12: Vitamin B-12: 872 pg/mL (ref 200–1100)

## 2019-11-07 LAB — TSH: TSH: 1.72 mIU/L

## 2019-11-11 LAB — SARS-COV-2 SEMI-QUANTITATIVE TOTAL ANTIBODY, SPIKE: SARS COV2 AB, Total Spike Semi QN: <0.4 U/mL (ref <0.8)

## 2019-11-29 ENCOUNTER — Ambulatory Visit: Payer: 59 | Admitting: Obstetrics and Gynecology

## 2020-02-18 ENCOUNTER — Encounter: Payer: Self-pay | Admitting: Osteopathic Medicine

## 2020-02-19 ENCOUNTER — Encounter: Payer: Self-pay | Admitting: Medical-Surgical

## 2020-02-19 ENCOUNTER — Telehealth (INDEPENDENT_AMBULATORY_CARE_PROVIDER_SITE_OTHER): Payer: 59 | Admitting: Medical-Surgical

## 2020-02-19 DIAGNOSIS — U071 COVID-19: Secondary | ICD-10-CM

## 2020-02-19 DIAGNOSIS — H9202 Otalgia, left ear: Secondary | ICD-10-CM

## 2020-02-19 MED ORDER — AZITHROMYCIN 250 MG PO TABS: ORAL_TABLET | ORAL | 0 refills | Status: DC

## 2020-02-19 MED ORDER — NEOMYCIN-POLYMYXIN-HC 1 % OT SOLN
3.0000 [drp] | Freq: Four times a day (QID) | OTIC | 0 refills | Status: AC
Start: 2020-02-19 — End: 2020-02-26

## 2020-02-19 NOTE — Progress Notes (Signed)
Virtual Visit via Video Note  I connected with Regina Andrews on 02/19/20 at 10:30 AM EST by a video enabled telemedicine application and verified that I am speaking with the correct person using two identifiers.   I discussed the limitations of evaluation and management by telemedicine and the availability of in person appointments. The patient expressed understanding and agreed to proceed.  Patient location: home Provider locations: office  Subjective:    CC: Ear infection, Covid positive  HPI: Pleasant 35 year old female presenting via MyChart video visit with reports of recently testing positive for Covid and a suspected left ear infection.  Starting the week of 1/10, she had severe body aches with rhinorrhea and significant fatigue.  Those symptoms lasted throughout the week but she was feeling better on Monday morning.  That night, she had a resurgence of her her fatigue and noted that she was unable to taste or smell.  She had a positive Covid test on Tuesday 1/18.  She has since retested over the past weekend with 2 - tests.  On Saturday night this past weekend, she developed left ear pain.  Notes that she does get frequent ear infections due to eczema in her ear canals.  She knows she should not, but she does scratch her ear canals with Q-tips.  She has eardrops that she is supposed to use every night but unfortunately she tends to forget these, especially when she is already half-asleep.  She started having sharp pains on Saturday night and by Sunday her left ear canal was swollen almost shut.  She did take naproxen last night which helped reduce the swelling and pain.  She does have some decrease in hearing on the left side.  Also notes that her left jaw and neck are quite painful.  She continues to have significant rhinorrhea which has been present for the last 2 weeks.  Denies fevers, chills, sore throat, sinus congestion, chest pain, shortness of breath, and cough.  Past medical history,  Surgical history, Family history not pertinant except as noted below, Social history, Allergies, and medications have been entered into the medical record, reviewed, and corrections made.   Review of Systems: See HPI for pertinent positives and negatives.   Objective:    General: Speaking clearly in complete sentences without any shortness of breath.  Alert and oriented x3.  Normal judgment. No apparent acute distress.  Impression and Recommendations:    1. COVID-19 virus infection Covid symptoms have resolved and she is out of the quarantine timeframe.  2. Left ear pain Sending in a azithromycin to cover otitis media as well as Cortisporin for otitis externa.  Continue naproxen as needed.  Okay to use heat or cold depending on what feels better for pain control.  Strongly advised against using Q-tips in the ear canal.  Discussed ways to remember eardrops to treat her eczema and prevent infections.  I discussed the assessment and treatment plan with the patient. The patient was provided an opportunity to ask questions and all were answered. The patient agreed with the plan and demonstrated an understanding of the instructions.   The patient was advised to call back or seek an in-person evaluation if the symptoms worsen or if the condition fails to improve as anticipated.  20 minutes of non-face-to-face time was provided during this encounter.  Return if symptoms worsen or fail to improve.  Thayer Ohm, DNP, APRN, FNP-BC [facility]

## 2020-05-31 ENCOUNTER — Other Ambulatory Visit: Payer: Self-pay | Admitting: Nurse Practitioner

## 2020-05-31 DIAGNOSIS — F411 Generalized anxiety disorder: Secondary | ICD-10-CM

## 2020-08-07 ENCOUNTER — Emergency Department
Admission: EM | Admit: 2020-08-07 | Discharge: 2020-08-07 | Disposition: A | Discharge status: Home / Self Care | Payer: 59 | Source: Home / Self Care | Attending: Family Medicine | Admitting: Family Medicine

## 2020-08-07 DIAGNOSIS — M109 Gout, unspecified: Secondary | ICD-10-CM | POA: Diagnosis not present

## 2020-08-07 DIAGNOSIS — R112 Nausea with vomiting, unspecified: Secondary | ICD-10-CM

## 2020-08-07 LAB — POCT URINALYSIS DIP (MANUAL ENTRY)
Bilirubin, UA: NEGATIVE
Glucose, UA: NEGATIVE mg/dL
Ketones, POC UA: NEGATIVE mg/dL
Leukocytes, UA: NEGATIVE
Nitrite, UA: NEGATIVE
Protein Ur, POC: NEGATIVE mg/dL
Spec Grav, UA: 1.015 (ref 1.010–1.025)
Urobilinogen, UA: 0.2 E.U./dL
pH, UA: 7.5 (ref 5.0–8.0)

## 2020-08-07 LAB — POCT FASTING CBG KUC MANUAL ENTRY: POCT Glucose (KUC): 107 mg/dL — AB (ref 70–99)

## 2020-08-07 MED ORDER — ONDANSETRON HCL 8 MG PO TABS: 8.0000 mg | ORAL_TABLET | Freq: Three times a day (TID) | ORAL | 0 refills | Status: DC | PRN

## 2020-08-07 NOTE — ED Provider Notes (Signed)
[facility]    CSN: [id number] Arrival date & time: 08/07/20  1202      History   Chief Complaint Chief Complaint  Patient presents with   Dizziness   Nausea    HPI Regina Andrews is a 35 y.o. female.   HPI  Patient states that her illness started yesterday.  She felt fine during the day.  She worked during the day.  In the evening she felt a little nauseated and "off" so went to bed early.  She got up in the middle of the night and had a spell of vomiting.  She went back to bed and then when she got up this morning had nausea, fatigue, some lightheadedness.  She states that she had been diagnosed as prediabetic in the past so she took her blood sugar.  By her monitor it was over 350.  She then came in here to be evaluated.  She is not on any diabetes treatment except for her low carbohydrate diet.  She states she had been very stressed at work and had not been following it quite strictly.  No known exposure to COVID.  She had COVID in January.  She did not get COVID vaccinated.  Past Medical History:  Diagnosis Date   Anxiety    Cervical cancer screening 04/10/2015   Hx abn Pap 05/2014    Cervical cancer screening 04/10/2015   per record review CIN1 09/18/12 which was observed, 08/11/12 Pap ASCUS (+)HPV, 07/06/13 Pap NILM, 06/19/14 Pap NILM and Neg HPV    Cervical cancer screening 04/10/2015   per record review CIN1 09/18/12 which was observed, 08/11/12 Pap ASCUS (+)HPV, 07/06/13 Pap NILM, 06/19/14 Pap NILM and Neg HPV    Encounter for supervision of normal pregnancy in multigravida 05/13/2015   Moved from [place], [place] > may be going to [place] towards end of pregnancy until baby 6 weeks Clinic  [place] Prenatal Labs Dating  8wk [place] Blood type: O/POS/-- (04/17 1106)  Genetic Screen 1 Screen: NT nml   AFP: Neg     : Antibody:NEG (04/17 1106) Anatomic [place] Nml @ 19 wks, limited > rescan in 4-6 wks-full eval of heart unable to obtain> f/u US nml and complete except RVOT not  visualized Rubella:    Family history of depression 04/10/2015   both parents    Family history of depression 04/10/2015   both parents    Family history of diabetes mellitus in first degree relative 04/10/2015   mother    Family history of heart disease 04/10/2015   Family history of heart disease 04/10/2015   History of abdominoplasty 04/10/2015   History of breast augmentation 04/10/2015   History of cholecystectomy 04/10/2015   History of goiter 04/10/2015   History of PCOS 04/10/2015   History of tonsillectomy 04/10/2015    Patient Active Problem List   Diagnosis Date Noted   IUD check up 10/18/2019   Abnormal uterine bleeding (AUB) 07/02/2019   Pain of left thumb 08/05/2016   Weight gain 08/05/2016   Worsening headaches 09/09/2015   Von Willebrand disease, type IIa (HCC) 07/12/2015   History of dysphagia 06/20/2015   Vasovagal near syncope 06/11/2015   Generalized anxiety disorder 04/16/2015   Anxiety state 04/10/2015   History of PCOS 04/10/2015   History of goiter 04/10/2015   History of breast augmentation 04/10/2015   History of abdominoplasty 04/10/2015   History of cholecystectomy 04/10/2015   History of tonsillectomy 04/10/2015    Past Surgical History:  Procedure  Laterality Date   BREAST ENHANCEMENT SURGERY     CHOLECYSTECTOMY     TONSILLECTOMY     TUMMY TUCK      OB History     Gravida  2   Para  2   Term  2   Preterm      AB      Living  2      SAB      IAB      Ectopic      Multiple      Live Births  2            Home Medications    Prior to Admission medications   Medication Sig Start Date End Date Taking? Authorizing Provider  naproxen sodium (ALEVE) 220 MG tablet Take 220 mg by mouth 2 (two) times daily as needed.   Yes [provider]  ondansetron (ZOFRAN) 8 MG tablet Take 1 tablet (8 mg total) by mouth every 8 (eight) hours as needed for nausea or vomiting. 08/07/20  Yes Eustace Moore, MD  colchicine 0.6 MG  tablet Take 1 tablet (0.6 mg total) by mouth 2 (two) times daily. 05/04/19   Sunnie Nielsen, DO  Magnesium 100 MG CAPS Take 1 capsule by mouth 2 (two) times daily.    [provider]  MELATONIN PO Take by mouth.    [provider]  sertraline (ZOLOFT) 50 MG tablet TAKE 1 AND 1/2 TABLETS BY  MOUTH DAILY 06/03/20   Sunnie Nielsen, DO    Family History Family History  Problem Relation Age of Onset   Hypertension Mother    Depression Mother    Diabetes Mother    Kidney disease Mother    Depression Father    Hyperlipidemia Father    Hypertension Father    Cancer Paternal Aunt     Social History Social History   Tobacco Use   Smoking status: Never   Smokeless tobacco: Never  Vaping Use   Vaping Use: Never used  Substance Use Topics   Alcohol use: Not Currently    Alcohol/week: 0.0 standard drinks   Drug use: Never     Allergies   Patient has no known allergies.   Review of Systems Review of Systems  See HPI Physical Exam Triage Vital Signs ED Triage Vitals  Enc Vitals Group     BP 08/07/20 1245 (!) 148/100     Pulse Rate 08/07/20 1245 71     Resp 08/07/20 1245 18     Temp 08/07/20 1245 98.4 F (36.9 C)     Temp src --      SpO2 08/07/20 1245 98 %     Weight 08/07/20 1245 (!) 337 lb (152.9 kg)     Height 08/07/20 1245 5\' 10"  (1.778 m)     Head Circumference --      Peak Flow --      Pain Score 08/07/20 1243 7     Pain Loc --      Pain Edu? --      Excl. in GC? --    No data found.  Updated Vital Signs BP 128/90 (BP Location: Right Arm)   Pulse 71   Temp 98.4 F (36.9 C)   Resp 18   Ht 5\' 10"  (1.778 m)   Wt (!) 152.9 kg   SpO2 98%   BMI 48.35 kg/m   Physical Exam Vitals and nursing note reviewed.  Constitutional:      General: She is  not in acute distress.    Appearance: She is well-developed.  HENT:     Head: Normocephalic and atraumatic.     Right Ear: Tympanic membrane and ear canal normal.     Left Ear: Tympanic  membrane normal.     Nose: Nose normal. No congestion.     Mouth/Throat:     Mouth: Mucous membranes are moist.     Pharynx: No posterior oropharyngeal erythema.  Eyes:     Conjunctiva/sclera: Conjunctivae normal.     Pupils: Pupils are equal, round, and reactive to light.     Comments: No nystagmus  Cardiovascular:     Rate and Rhythm: Normal rate and regular rhythm.     Heart sounds: Normal heart sounds.  Pulmonary:     Effort: Pulmonary effort is normal. No respiratory distress.     Breath sounds: Normal breath sounds. No wheezing, rhonchi or rales.  Abdominal:     General: There is no distension.     Palpations: Abdomen is soft.  Musculoskeletal:        General: Normal range of motion.     Cervical back: Normal range of motion and neck supple.  Skin:    General: Skin is warm and dry.  Neurological:     General: No focal deficit present.     Mental Status: She is alert.     Motor: No weakness.     Gait: Gait normal.  Psychiatric:        Mood and Affect: Mood normal.        Behavior: Behavior normal.     UC Treatments / Results  Labs (all labs ordered are listed, but only abnormal results are displayed) Labs Reviewed  POCT URINALYSIS DIP (MANUAL ENTRY) - Abnormal; Notable for the following components:      Result Value   Blood, UA trace-lysed (*)    All other components within normal limits  POCT FASTING CBG [facility] MANUAL ENTRY - Abnormal; Notable for the following components:   POCT Glucose ([facility]) 107 (*)    All other components within normal limits    EKG   Radiology No results found.  Procedures Procedures (including critical care time)  Medications Ordered in UC Medications - No data to display  Initial Impression / Assessment and Plan / UC Course  I have reviewed the triage vital signs and the nursing notes.  Pertinent labs & imaging results that were available during my care of the patient were reviewed by me and considered in my medical decision  making (see chart for details).     I explained to patient that I thought she had a viral infection.  I do not think it is related to her blood sugar.  Her blood sugar here is normal at 107 and she does not have any glucosuria which makes the believe her monitor is not correct.  She will follow-up with her primary care doctor regarding her prediabetes. We tested for COVID, flu A, flu B. Ongoing to send her home with nausea medicine and symptomatic care Final Clinical Impressions(s) / UC Diagnoses   Final diagnoses:  Nausea and vomiting, intractability of vomiting not specified, unspecified vomiting type  Acute gout involving toe of right foot, unspecified cause     Discharge Instructions      Continue to drink plenty of water Continue to try to eat a low-carb diet.  Avoid sugar, sweets, soft drinks I am including the diet for gout Take nausea medicine as needed Call or  return if not better in a few days.   ED Prescriptions     Medication Sig Dispense Auth. Provider   ondansetron (ZOFRAN) 8 MG tablet Take 1 tablet (8 mg total) by mouth every 8 (eight) hours as needed for nausea or vomiting. 20 tablet Eustace Moore, MD      PDMP not reviewed this encounter.   Eustace Moore, MD 08/07/20 [phone]

## 2020-08-07 NOTE — Discharge Instructions (Addendum)
Continue to drink plenty of water Continue to try to eat a low-carb diet.  Avoid sugar, sweets, soft drinks I am including the diet for gout Take nausea medicine as needed Call or return if not better in a few days.

## 2020-08-07 NOTE — ED Triage Notes (Signed)
Pt presents to Urgent Care with c/o dizziness and nausea since last night. Reports CBG this AM was 354--pt is pre-diabetic. Also c/o recent gout flare-up in R great toe. Has not done home COVID test; pt not vaccinated.

## 2020-08-09 LAB — COVID-19, FLU A+B NAA
Influenza A, NAA: NOT DETECTED
Influenza B, NAA: NOT DETECTED
SARS-CoV-2, NAA: NOT DETECTED

## 2020-08-15 ENCOUNTER — Telehealth (INDEPENDENT_AMBULATORY_CARE_PROVIDER_SITE_OTHER): Payer: 59 | Admitting: Physician Assistant

## 2020-08-15 ENCOUNTER — Encounter: Payer: Self-pay | Admitting: Physician Assistant

## 2020-08-15 DIAGNOSIS — M109 Gout, unspecified: Secondary | ICD-10-CM

## 2020-08-15 MED ORDER — HYDROCODONE-ACETAMINOPHEN 5-325 MG PO TABS: 1.0000 | ORAL_TABLET | Freq: Every evening | ORAL | 0 refills | Status: AC | PRN

## 2020-08-15 MED ORDER — PREDNISONE 10 MG (48) PO TBPK: ORAL_TABLET | Freq: Every day | ORAL | 0 refills | Status: DC

## 2020-08-15 NOTE — Progress Notes (Addendum)
Telemedicine Visit via  Audio only - telephone ( technical difficulty with MyChart video application)  I connected with Regina Andrews on 08/15/20 at 4:50 PM  by phone or   as noted above  I verified that I am speaking with or regarding  the correct patient using two identifiers.  Participants: Myself, Gena Frayharley Argie Applegate PA-C Patient: Regina JollyAmber Lopezgarcia   Patient is at home I am in office at [facility]     I discussed the limitations of evaluation and management  by telemedicine and the availability of in person appointments.  The participant(s) above expressed understanding and  agreed to proceed with this appointment via telemedicine.       History of Present Illness: Regina Andrews is a 35 y.o. female who would like to discuss right foot pain   Reports last April she was diagnosed with gout of base of her big toe She was treated with Colchicine with complete resolution of symptoms States she had recurrence of the same symptoms 2 weeks ago, but this time symptoms are lasting longer Endorses pain, swelling, redness of her 1st MTP joint - difficulty ambulating Taking Aleve every 12 hrs without relief She is not interested in preventive medication because she doesn't want to take something daily She does not drink alcohol Reports mother and grandmother have gout     Observations/Objective: There were no vitals taken for this visit. BP Readings from Last 3 Encounters:  08/07/20 128/90  11/06/19 112/79  10/18/19 116/85   Exam limited by telephone Patient will upload photos to MyChart      Lab and Radiology Results No results found for this or any previous visit (from the past 72 hour(s)). No results found.     Assessment and Plan: 35 y.o. female with The encounter diagnosis was Acute gout involving toe of right foot, unspecified cause.  Prednisone 12-day taper D/C Aleve Norco 5 mg QHS prn nighttime pain up to 3 days Follow-up in person if symptoms  are persisting or worsening  PDMP reviewed during this encounter. No orders of the defined types were placed in this encounter.  Meds ordered this encounter  Medications   predniSONE (STERAPRED UNI-PAK 48 TAB) 10 MG (48) TBPK tablet    Sig: Take by mouth daily. Use as directed for taper    Dispense:  48 each    Refill:  0    Order Specific Question:   Supervising Provider    Answer:   Sunnie NielsenALEXANDER, NATALIE [[id number]]   HYDROcodone-acetaminophen (NORCO) 5-325 MG tablet    Sig: Take 1 tablet by mouth at bedtime as needed for up to 3 days for moderate pain or severe pain.    Dispense:  3 tablet    Refill:  0    Order Specific Question:   Supervising Provider    Answer:   Sunnie NielsenLEXANDER, NATALIE [[id number]]   Patient Instructions  Stop Aleve Start Prednisone (steroid) taper - follow instructions on package for the taper Do not combine Prednisone with NSAIDs like Aleve/Naproxen/Ibuprofen/Advil etc. You can take 1 Norco at bedtime as needed for moderate-severe pain interfering with sleep  Gout  Gout is a condition that causes painful swelling of the joints. Gout is a type of inflammation of the joints (arthritis). This condition is caused by having too much uric acid in the body. Uric acid is a chemical that forms when the body breaks down substances called purines.Purines are important for building body proteins. When the body has too much uric acid, sharp crystals can form and  build up inside the joints. This causes pain and swelling. Gout attacks can happen quickly and may be very painful (acute gout). Over time, the attacks can affect more joints and become more frequent (chronic gout). Gout can also cause uric acid to build up under the skin and inside thekidneys. What are the causes? This condition is caused by too much uric acid in your blood. This can happen because: Your kidneys do not remove enough uric acid from your blood. This is the most common cause. Your body makes too much uric acid.  This can happen with some cancers and cancer treatments. It can also occur if your body is breaking down too many red blood cells (hemolytic anemia). You eat too many foods that are high in purines. These foods include organ meats and some seafood. Alcohol, especially beer, is also high in purines. A gout attack may be triggered by trauma or stress. What increases the risk? You are more likely to develop this condition if you: Have a family history of gout. Are female and middle-aged. Are female and have gone through menopause. Are obese. Frequently drink alcohol, especially beer. Are dehydrated. Lose weight too quickly. Have an organ transplant. Have lead poisoning. Take certain medicines, including aspirin, cyclosporine, diuretics, levodopa, and niacin. Have kidney disease. Have a skin condition called psoriasis. What are the signs or symptoms? An attack of acute gout happens quickly. It usually occurs in just one joint. The most common place is the big toe. Attacks often start at night. Other joints that may be affected include joints of the feet, ankle, knee, fingers, wrist, or elbow. Symptoms of this condition may include: Severe pain. Warmth. Swelling. Stiffness. Tenderness. The affected joint may be very painful to touch. Shiny, red, or purple skin. Chills and fever. Chronic gout may cause symptoms more frequently. More joints may be involved. You may also have white or yellow lumps (tophi) on your hands or feet or in other areas near your joints. How is this diagnosed? This condition is diagnosed based on your symptoms, medical history, and physical exam. You may have tests, such as: Blood tests to measure uric acid levels. Removal of joint fluid with a thin needle (aspiration) to look for uric acid crystals. X-rays to look for joint damage. How is this treated? Treatment for this condition has two phases: treating an acute attack and preventing future attacks. Acute gout  treatment may include medicines to reduce pain and swelling, including: NSAIDs. Steroids. These are strong anti-inflammatory medicines that can be taken by mouth (orally) or injected into a joint. Colchicine. This medicine relieves pain and swelling when it is taken soon after an attack. It can be given by mouth or through an IV. Preventive treatment may include: Daily use of smaller doses of NSAIDs or colchicine. Use of a medicine that reduces uric acid levels in your blood. Changes to your diet. You may need to see a dietitian about what to eat and drink to prevent gout. Follow these instructions at home: During a gout attack  If directed, put ice on the affected area: Put ice in a plastic bag. Place a towel between your skin and the bag. Leave the ice on for 20 minutes, 2-3 times a day. Raise (elevate) the affected joint above the level of your heart as often as possible. Rest the joint as much as possible. If the affected joint is in your leg, you may be given crutches to use. Follow instructions from your health care provider  about eating or drinking restrictions.  Avoiding future gout attacks Follow a low-purine diet as told by your dietitian or health care provider. Avoid foods and drinks that are high in purines, including liver, kidney, anchovies, asparagus, herring, mushrooms, mussels, and beer. Maintain a healthy weight or lose weight if you are overweight. If you want to lose weight, talk with your health care provider. It is important that you do not lose weight too quickly. Start or maintain an exercise program as told by your health care provider. Eating and drinking Drink enough fluids to keep your urine pale yellow. If you drink alcohol: Limit how much you use to: 0-1 drink a day for women. 0-2 drinks a day for men. Be aware of how much alcohol is in your drink. In the [place] one drink equals one 12 oz bottle of beer (355 mL) one 5 oz glass of wine (148 mL), or one 1 oz  glass of hard liquor (44 mL). General instructions Take over-the-counter and prescription medicines only as told by your health care provider. Do not drive or use heavy machinery while taking prescription pain medicine. Return to your normal activities as told by your health care provider. Ask your health care provider what activities are safe for you. Keep all follow-up visits as told by your health care provider. This is important. Contact a health care provider if you have: Another gout attack. Continuing symptoms of a gout attack after 10 days of treatment. Side effects from your medicines. Chills or a fever. Burning pain when you urinate. Pain in your lower back or belly. Get help right away if you: Have severe or uncontrolled pain. Cannot urinate. Summary Gout is painful swelling of the joints caused by inflammation. The most common site of pain is the big toe, but it can affect other joints in the body. Medicines and dietary changes can help to prevent and treat gout attacks. This information is not intended to replace advice given to you by your health care provider. Make sure you discuss any questions you have with your healthcare provider. Document Revised: 08/04/2017 Document Reviewed: 08/04/2017 Elsevier Patient Education  2022 [employer].  Instructions sent via MyChart.   Follow Up Instructions: Return if symptoms worsen or fail to improve.    I discussed the assessment and treatment plan with the patient. The patient was provided an opportunity to ask questions and all were answered. The patient agreed with the plan and demonstrated an understanding of the instructions.   The patient was advised to call back or seek an in-person evaluation if any new concerns, if symptoms worsen or if the condition fails to improve as anticipated.  10 minutes of non-face-to-face time was provided during this  encounter.      . . . . . . . . . . . . . [name]                   Historical information moved to improve visibility of documentation.  Past Medical History:  Diagnosis Date   Anxiety    Cervical cancer screening 04/10/2015   Hx abn Pap 05/2014    Cervical cancer screening 04/10/2015   per record review CIN1 09/18/12 which was observed, 08/11/12 Pap ASCUS (+)HPV, 07/06/13 Pap NILM, 06/19/14 Pap NILM and Neg HPV    Cervical cancer screening 04/10/2015   per record review CIN1 09/18/12 which was observed, 08/11/12 Pap ASCUS (+)HPV, 07/06/13 Pap NILM, 06/19/14 Pap NILM and Neg HPV    Encounter  for supervision of normal pregnancy in multigravida 05/13/2015   Moved from [place], [place] > may be going to [place] towards end of pregnancy until baby 6 weeks Clinic  [place] Prenatal Labs Dating  8wk [place] Blood type: O/POS/-- (04/17 1106)  Genetic Screen 1 Screen: NT nml   AFP: Neg     : Antibody:NEG (04/17 1106) Anatomic [place] Nml @ 19 wks, limited > rescan in 4-6 wks-full eval of heart unable to obtain> f/u US nml and complete except RVOT not visualized Rubella:    Family history of depression 04/10/2015   both parents    Family history of depression 04/10/2015   both parents    Family history of diabetes mellitus in first degree relative 04/10/2015   mother    Family history of heart disease 04/10/2015   Family history of heart disease 04/10/2015   History of abdominoplasty 04/10/2015   History of breast augmentation 04/10/2015   History of cholecystectomy 04/10/2015   History of goiter 04/10/2015   History of PCOS 04/10/2015   History of tonsillectomy 04/10/2015   Past Surgical History:  Procedure Laterality Date   BREAST ENHANCEMENT SURGERY     CHOLECYSTECTOMY     TONSILLECTOMY     TUMMY TUCK     Social History   Tobacco Use   Smoking status: Never   Smokeless tobacco: Never  Substance Use Topics   Alcohol use: Not Currently    Alcohol/week: 0.0 standard drinks   family  history includes Cancer in her paternal aunt; Depression in her father and mother; Diabetes in her mother; Hyperlipidemia in her father; Hypertension in her father and mother; Kidney disease in her mother.  Medications: Current Outpatient Medications  Medication Sig Dispense Refill   HYDROcodone-acetaminophen (NORCO) 5-325 MG tablet Take 1 tablet by mouth at bedtime as needed for up to 3 days for moderate pain or severe pain. 3 tablet 0   MELATONIN PO Take by mouth.     naproxen sodium (ALEVE) 220 MG tablet Take 220 mg by mouth 2 (two) times daily as needed.     predniSONE (STERAPRED UNI-PAK 48 TAB) 10 MG (48) TBPK tablet Take by mouth daily. Use as directed for taper 48 each 0   sertraline (ZOLOFT) 50 MG tablet TAKE 1 AND 1/2 TABLETS BY  MOUTH DAILY 135 tablet 0   colchicine 0.6 MG tablet Take 1 tablet (0.6 mg total) by mouth 2 (two) times daily. 60 tablet 2   Magnesium 100 MG CAPS Take 1 capsule by mouth 2 (two) times daily. (Patient not taking: Reported on 08/15/2020)     ondansetron (ZOFRAN) 8 MG tablet Take 1 tablet (8 mg total) by mouth every 8 (eight) hours as needed for nausea or vomiting. (Patient not taking: Reported on 08/15/2020) 20 tablet 0   No current facility-administered medications for this visit.   No Known Allergies   If phone visit, billing and coding can please add appropriate modifier if needed

## 2020-08-15 NOTE — Patient Instructions (Addendum)
Stop Aleve Start Prednisone (steroid) taper - follow instructions on package for the taper Do not combine Prednisone with NSAIDs like Aleve/Naproxen/Ibuprofen/Advil etc. You can take 1 Norco at bedtime as needed for moderate-severe pain interfering with sleep  Gout  Gout is a condition that causes painful swelling of the joints. Gout is a type of inflammation of the joints (arthritis). This condition is caused by having too much uric acid in the body. Uric acid is a chemical that forms when the body breaks down substances called purines.Purines are important for building body proteins. When the body has too much uric acid, sharp crystals can form and build up inside the joints. This causes pain and swelling. Gout attacks can happen quickly and may be very painful (acute gout). Over time, the attacks can affect more joints and become more frequent (chronic gout). Gout can also cause uric acid to build up under the skin and inside thekidneys. What are the causes? This condition is caused by too much uric acid in your blood. This can happen because: Your kidneys do not remove enough uric acid from your blood. This is the most common cause. Your body makes too much uric acid. This can happen with some cancers and cancer treatments. It can also occur if your body is breaking down too many red blood cells (hemolytic anemia). You eat too many foods that are high in purines. These foods include organ meats and some seafood. Alcohol, especially beer, is also high in purines. A gout attack may be triggered by trauma or stress. What increases the risk? You are more likely to develop this condition if you: Have a family history of gout. Are female and middle-aged. Are female and have gone through menopause. Are obese. Frequently drink alcohol, especially beer. Are dehydrated. Lose weight too quickly. Have an organ transplant. Have lead poisoning. Take certain medicines, including aspirin, cyclosporine,  diuretics, levodopa, and niacin. Have kidney disease. Have a skin condition called psoriasis. What are the signs or symptoms? An attack of acute gout happens quickly. It usually occurs in just one joint. The most common place is the big toe. Attacks often start at night. Other joints that may be affected include joints of the feet, ankle, knee, fingers, wrist, or elbow. Symptoms of this condition may include: Severe pain. Warmth. Swelling. Stiffness. Tenderness. The affected joint may be very painful to touch. Shiny, red, or purple skin. Chills and fever. Chronic gout may cause symptoms more frequently. More joints may be involved. You may also have white or yellow lumps (tophi) on your hands or feet or in other areas near your joints. How is this diagnosed? This condition is diagnosed based on your symptoms, medical history, and physical exam. You may have tests, such as: Blood tests to measure uric acid levels. Removal of joint fluid with a thin needle (aspiration) to look for uric acid crystals. X-rays to look for joint damage. How is this treated? Treatment for this condition has two phases: treating an acute attack and preventing future attacks. Acute gout treatment may include medicines to reduce pain and swelling, including: NSAIDs. Steroids. These are strong anti-inflammatory medicines that can be taken by mouth (orally) or injected into a joint. Colchicine. This medicine relieves pain and swelling when it is taken soon after an attack. It can be given by mouth or through an IV. Preventive treatment may include: Daily use of smaller doses of NSAIDs or colchicine. Use of a medicine that reduces uric acid levels in your blood.  Changes to your diet. You may need to see a dietitian about what to eat and drink to prevent gout. Follow these instructions at home: During a gout attack  If directed, put ice on the affected area: Put ice in a plastic bag. Place a towel between your skin  and the bag. Leave the ice on for 20 minutes, 2-3 times a day. Raise (elevate) the affected joint above the level of your heart as often as possible. Rest the joint as much as possible. If the affected joint is in your leg, you may be given crutches to use. Follow instructions from your health care provider about eating or drinking restrictions.  Avoiding future gout attacks Follow a low-purine diet as told by your dietitian or health care provider. Avoid foods and drinks that are high in purines, including liver, kidney, anchovies, asparagus, herring, mushrooms, mussels, and beer. Maintain a healthy weight or lose weight if you are overweight. If you want to lose weight, talk with your health care provider. It is important that you do not lose weight too quickly. Start or maintain an exercise program as told by your health care provider. Eating and drinking Drink enough fluids to keep your urine pale yellow. If you drink alcohol: Limit how much you use to: 0-1 drink a day for women. 0-2 drinks a day for men. Be aware of how much alcohol is in your drink. In the [place] one drink equals one 12 oz bottle of beer (355 mL) one 5 oz glass of wine (148 mL), or one 1 oz glass of hard liquor (44 mL). General instructions Take over-the-counter and prescription medicines only as told by your health care provider. Do not drive or use heavy machinery while taking prescription pain medicine. Return to your normal activities as told by your health care provider. Ask your health care provider what activities are safe for you. Keep all follow-up visits as told by your health care provider. This is important. Contact a health care provider if you have: Another gout attack. Continuing symptoms of a gout attack after 10 days of treatment. Side effects from your medicines. Chills or a fever. Burning pain when you urinate. Pain in your lower back or belly. Get help right away if you: Have severe or  uncontrolled pain. Cannot urinate. Summary Gout is painful swelling of the joints caused by inflammation. The most common site of pain is the big toe, but it can affect other joints in the body. Medicines and dietary changes can help to prevent and treat gout attacks. This information is not intended to replace advice given to you by your health care provider. Make sure you discuss any questions you have with your healthcare provider. Document Revised: 08/04/2017 Document Reviewed: 08/04/2017 Elsevier Patient Education  2022 [employer].

## 2020-08-15 NOTE — Progress Notes (Signed)
2 weeks.  

## 2020-08-21 ENCOUNTER — Other Ambulatory Visit: Payer: Self-pay | Admitting: Osteopathic Medicine

## 2020-08-21 DIAGNOSIS — F411 Generalized anxiety disorder: Secondary | ICD-10-CM

## 2020-11-12 ENCOUNTER — Other Ambulatory Visit: Payer: Self-pay | Admitting: Osteopathic Medicine

## 2020-11-12 DIAGNOSIS — F411 Generalized anxiety disorder: Secondary | ICD-10-CM

## 2020-12-24 ENCOUNTER — Encounter: Payer: Self-pay | Admitting: Emergency Medicine

## 2020-12-24 ENCOUNTER — Emergency Department
Admission: EM | Admit: 2020-12-24 | Discharge: 2020-12-24 | Disposition: A | Discharge status: Home / Self Care | Payer: 59 | Source: Home / Self Care

## 2020-12-24 ENCOUNTER — Other Ambulatory Visit: Payer: Self-pay

## 2020-12-24 DIAGNOSIS — R059 Cough, unspecified: Secondary | ICD-10-CM

## 2020-12-24 DIAGNOSIS — R52 Pain, unspecified: Secondary | ICD-10-CM | POA: Diagnosis not present

## 2020-12-24 MED ORDER — CEFDINIR 300 MG PO CAPS: 300.0000 mg | ORAL_CAPSULE | Freq: Two times a day (BID) | ORAL | 0 refills | Status: AC

## 2020-12-24 MED ORDER — PREDNISONE 20 MG PO TABS: ORAL_TABLET | ORAL | 0 refills | Status: DC

## 2020-12-24 NOTE — ED Provider Notes (Signed)
[facility]    CSN: [id number] Arrival date & time: 12/24/20  5027      History   Chief Complaint Chief Complaint  Patient presents with   Generalized Body Aches    HPI Regina Andrews is a 35 y.o. female.   HPI 35 year old female presents with sore throat, body aches, and congestion for 6 days.  Patient reports now starting to wheeze.  Patient reports that her son had flu last week and she had COVID-19 1 month ago.  Past Medical History:  Diagnosis Date   Anxiety    Cervical cancer screening 04/10/2015   Hx abn Pap 05/2014    Cervical cancer screening 04/10/2015   per record review CIN1 09/18/12 which was observed, 08/11/12 Pap ASCUS (+)HPV, 07/06/13 Pap NILM, 06/19/14 Pap NILM and Neg HPV    Cervical cancer screening 04/10/2015   per record review CIN1 09/18/12 which was observed, 08/11/12 Pap ASCUS (+)HPV, 07/06/13 Pap NILM, 06/19/14 Pap NILM and Neg HPV    Encounter for supervision of normal pregnancy in multigravida 05/13/2015   Moved from [place], [place] > may be going to [place] towards end of pregnancy until baby 6 weeks Clinic  [place] Prenatal Labs Dating  8wk [place] Blood type: O/POS/-- (04/17 1106)  Genetic Screen 1 Screen: NT nml   AFP: Neg     : Antibody:NEG (04/17 1106) Anatomic [place] Nml @ 19 wks, limited > rescan in 4-6 wks-full eval of heart unable to obtain> f/u US nml and complete except RVOT not visualized Rubella:    Family history of depression 04/10/2015   both parents    Family history of depression 04/10/2015   both parents    Family history of diabetes mellitus in first degree relative 04/10/2015   mother    Family history of heart disease 04/10/2015   Family history of heart disease 04/10/2015   History of abdominoplasty 04/10/2015   History of breast augmentation 04/10/2015   History of cholecystectomy 04/10/2015   History of goiter 04/10/2015   History of PCOS 04/10/2015   History of tonsillectomy 04/10/2015    Patient Active Problem List   Diagnosis Date Noted    IUD check up 10/18/2019   Abnormal uterine bleeding (AUB) 07/02/2019   Pain of left thumb 08/05/2016   Weight gain 08/05/2016   Worsening headaches 09/09/2015   Von Willebrand disease, type IIa 07/12/2015   History of dysphagia 06/20/2015   Vasovagal near syncope 06/11/2015   Generalized anxiety disorder 04/16/2015   Anxiety state 04/10/2015   History of PCOS 04/10/2015   History of goiter 04/10/2015   History of breast augmentation 04/10/2015   History of abdominoplasty 04/10/2015   History of cholecystectomy 04/10/2015   History of tonsillectomy 04/10/2015    Past Surgical History:  Procedure Laterality Date   BREAST ENHANCEMENT SURGERY     CHOLECYSTECTOMY     TONSILLECTOMY     TUMMY TUCK      OB History     Gravida  2   Para  2   Term  2   Preterm      AB      Living  2      SAB      IAB      Ectopic      Multiple      Live Births  2            Home Medications    Prior to Admission medications   Medication Sig Start Date End  Date Taking? Authorizing Provider  cefdinir (OMNICEF) 300 MG capsule Take 1 capsule (300 mg total) by mouth 2 (two) times daily for 7 days. 12/24/20 12/31/20 Yes Regina Iha, FNP  predniSONE (DELTASONE) 20 MG tablet Take 3 tabs PO daily x 5 days. 12/24/20  Yes Regina Iha, FNP  colchicine 0.6 MG tablet Take 1 tablet (0.6 mg total) by mouth 2 (two) times daily. Patient not taking: Reported on 12/24/2020 05/04/19   Sunnie Nielsen, DO  Magnesium 100 MG CAPS Take 1 capsule by mouth 2 (two) times daily. Patient not taking: Reported on 08/15/2020    [provider]  MELATONIN PO Take by mouth.    [provider]  naproxen sodium (ALEVE) 220 MG tablet Take 220 mg by mouth 2 (two) times daily as needed. Patient not taking: Reported on 12/24/2020    [provider]  ondansetron (ZOFRAN) 8 MG tablet Take 1 tablet (8 mg total) by mouth every 8 (eight) hours as needed for nausea or  vomiting. Patient not taking: Reported on 08/15/2020 08/07/20   Eustace Moore, MD  sertraline (ZOLOFT) 50 MG tablet TAKE 1 AND 1/2 TABLETS BY  MOUTH DAILY 11/14/20   Agapito Games, MD    Family History Family History  Problem Relation Age of Onset   Hypertension Mother    Depression Mother    Diabetes Mother    Kidney disease Mother    Depression Father    Hyperlipidemia Father    Hypertension Father    Cancer Paternal Aunt     Social History Social History   Tobacco Use   Smoking status: Never   Smokeless tobacco: Never  Vaping Use   Vaping Use: Never used  Substance Use Topics   Alcohol use: Not Currently    Alcohol/week: 0.0 standard drinks   Drug use: Never     Allergies   Patient has no known allergies.   Review of Systems Review of Systems  HENT:  Positive for congestion.   Respiratory:  Positive for wheezing.   Musculoskeletal:  Positive for myalgias.  All other systems reviewed and are negative.   Physical Exam Triage Vital Signs ED Triage Vitals  Enc Vitals Group     BP 12/24/20 0841 132/88     Pulse Rate 12/24/20 0841 69     Resp 12/24/20 0841 16     Temp 12/24/20 0841 98.6 F (37 C)     Temp Source 12/24/20 0841 Oral     SpO2 12/24/20 0841 98 %     Weight 12/24/20 0846 (!) 340 lb (154.2 kg)     Height 12/24/20 0846 5\' 10"  (1.778 m)     Head Circumference --      Peak Flow --      Pain Score 12/24/20 0846 4     Pain Loc --      Pain Edu? --      Excl. in GC? --    No data found.  Updated Vital Signs BP 132/88 (BP Location: Right Arm)   Pulse 69   Temp 98.6 F (37 C) (Oral)   Resp 16   Ht 5\' 10"  (1.778 m)   Wt (!) 340 lb (154.2 kg)   SpO2 98%   BMI 48.78 kg/m     Physical Exam Vitals and nursing note reviewed.  Constitutional:      General: She is not in acute distress.    Appearance: She is obese. She is not ill-appearing.  HENT:  Head: Normocephalic and atraumatic.     Right Ear: Tympanic membrane, ear  canal and external ear normal.     Left Ear: Tympanic membrane, ear canal and external ear normal.     Mouth/Throat:     Mouth: Mucous membranes are moist.     Pharynx: Oropharynx is clear.  Eyes:     Extraocular Movements: Extraocular movements intact.     Conjunctiva/sclera: Conjunctivae normal.     Pupils: Pupils are equal, round, and reactive to light.  Cardiovascular:     Rate and Rhythm: Normal rate and regular rhythm.     Pulses: Normal pulses.     Heart sounds: Normal heart sounds.  Pulmonary:     Effort: Pulmonary effort is normal.     Breath sounds: Normal breath sounds. No wheezing, rhonchi or rales.     Comments: Infrequent nonproductive cough noted on exam Musculoskeletal:        General: Normal range of motion.     Cervical back: Normal range of motion and neck supple.  Skin:    General: Skin is warm and dry.  Neurological:     General: No focal deficit present.     Mental Status: She is alert and oriented to person, place, and time.     UC Treatments / Results  Labs (all labs ordered are listed, but only abnormal results are displayed) Labs Reviewed - No data to display  EKG   Radiology No results found.  Procedures Procedures (including critical care time)  Medications Ordered in UC Medications - No data to display  Initial Impression / Assessment and Plan / UC Course  I have reviewed the triage vital signs and the nursing notes.  Pertinent labs & imaging results that were available during my care of the patient were reviewed by me and considered in my medical decision making (see chart for details).    MDM: 1.  Cough-Rx'd Cefdinir and Prednisone burst. Advised patient to take medications as directed with food to completion.  Advised patient to take Prednisone burst with first dose of Cefdinir for the next 5 of 7 days.  Encouraged patient to increase daily water intake while taking these medications.   Final Clinical Impressions(s) / UC Diagnoses    Final diagnoses:  Body aches  Cough, unspecified type     Discharge Instructions      Advised patient to take medications as directed with food to completion.  Advised patient to take Prednisone burst with first dose of Cefdinir for the next 5 of 7 days.  Encouraged patient to increase daily water intake while taking these medications.     ED Prescriptions     Medication Sig Dispense Auth. Provider   cefdinir (OMNICEF) 300 MG capsule Take 1 capsule (300 mg total) by mouth 2 (two) times daily for 7 days. 14 capsule Regina Iha, FNP   predniSONE (DELTASONE) 20 MG tablet Take 3 tabs PO daily x 5 days. 15 tablet Regina Iha, FNP      PDMP not reviewed this encounter.   Regina Iha, FNP 12/24/20 [phone]

## 2020-12-24 NOTE — Discharge Instructions (Addendum)
Advised patient to take medications as directed with food to completion.  Advised patient to take Prednisone burst with first dose of Cefdinir for the next 5 of 7 days.  Encouraged patient to increase daily water intake while taking these medications.

## 2020-12-24 NOTE — ED Triage Notes (Addendum)
Son had flu last week Pt now has sore throat, body aches, congestion since wed Feels like she is wheezing No fever at home  No flu or COVID vaccine  COVID  one month ago

## 2021-01-21 ENCOUNTER — Encounter: Payer: Self-pay | Admitting: Family Medicine

## 2021-01-21 ENCOUNTER — Ambulatory Visit: Payer: 59 | Admitting: Family Medicine

## 2021-01-21 ENCOUNTER — Other Ambulatory Visit: Payer: Self-pay

## 2021-01-21 VITALS — BP 132/81 | HR 79 | Temp 98.4°F | Ht 70.0 in | Wt 339.0 lb

## 2021-01-21 DIAGNOSIS — M109 Gout, unspecified: Secondary | ICD-10-CM

## 2021-01-21 MED ORDER — COLCHICINE 0.6 MG PO TABS: 0.6000 mg | ORAL_TABLET | Freq: Two times a day (BID) | ORAL | 2 refills | Status: DC | PRN

## 2021-01-21 MED ORDER — PREDNISONE 20 MG PO TABS: ORAL_TABLET | ORAL | 0 refills | Status: AC

## 2021-01-21 NOTE — Progress Notes (Signed)
Acute Office Visit  Subjective:    Patient ID: Regina Andrews, female    DOB: 05-Aug-1985, 35 y.o.   MRN: [id number]  Chief Complaint  Patient presents with   Gout    Since 01/09/21    HPI Patient is in today for gout flare.   Patient states that for the past 1.5 weeks or so she has been experiencing a gout flare to her right foot (great toe and ankle). States it has been swollen, red, warm, extremely painful to light touch and hard to walk on due to the pain. She found some old prednisone at home and was able to take 4 days worth which did help quite a bit, but did not fully resolve.   She reports her flares are getting more frequent. She has had about 3-4 over the past 6 months. She is not on any daily medication for prevention.       Past Medical History:  Diagnosis Date   Anxiety    Cervical cancer screening 04/10/2015   Hx abn Pap 05/2014    Cervical cancer screening 04/10/2015   per record review CIN1 09/18/12 which was observed, 08/11/12 Pap ASCUS (+)HPV, 07/06/13 Pap NILM, 06/19/14 Pap NILM and Neg HPV    Cervical cancer screening 04/10/2015   per record review CIN1 09/18/12 which was observed, 08/11/12 Pap ASCUS (+)HPV, 07/06/13 Pap NILM, 06/19/14 Pap NILM and Neg HPV    Encounter for supervision of normal pregnancy in multigravida 05/13/2015   Moved from [place], [place] > may be going to [place] towards end of pregnancy until baby 6 weeks Clinic  [facility] Prenatal Labs Dating  8wk [place] Blood type: O/POS/-- (04/17 1106)  Genetic Screen 1 Screen: NT nml   AFP: Neg     : Antibody:NEG (04/17 1106) Anatomic [place] Nml @ 19 wks, limited > rescan in 4-6 wks-full eval of heart unable to obtain> f/u US nml and complete except RVOT not visualized Rubella:    Family history of depression 04/10/2015   both parents    Family history of depression 04/10/2015   both parents    Family history of diabetes mellitus in first degree relative 04/10/2015   mother    Family history of heart disease 04/10/2015    Family history of heart disease 04/10/2015   History of abdominoplasty 04/10/2015   History of breast augmentation 04/10/2015   History of cholecystectomy 04/10/2015   History of goiter 04/10/2015   History of PCOS 04/10/2015   History of tonsillectomy 04/10/2015    Past Surgical History:  Procedure Laterality Date   BREAST ENHANCEMENT SURGERY     CHOLECYSTECTOMY     TONSILLECTOMY     TUMMY TUCK      Family History  Problem Relation Age of Onset   Hypertension Mother    Depression Mother    Diabetes Mother    Kidney disease Mother    Depression Father    Hyperlipidemia Father    Hypertension Father    Cancer Paternal Aunt     Social History   Socioeconomic History   Marital status: Married    Spouse name: Not on file   Number of children: Not on file   Years of education: Not on file   Highest education level: Not on file  Occupational History   Not on file  Tobacco Use   Smoking status: Never   Smokeless tobacco: Never  Vaping Use   Vaping Use: Never used  Substance and Sexual Activity   Alcohol  use: Not Currently    Alcohol/week: 0.0 standard drinks   Drug use: Never   Sexual activity: Yes    Birth control/protection: I.U.D.  Other Topics Concern   Not on file  Social History Narrative   Not on file   Social Determinants of Health   Financial Resource Strain: Not on file  Food Insecurity: Not on file  Transportation Needs: Not on file  Physical Activity: Not on file  Stress: Not on file  Social Connections: Not on file  Intimate Partner Violence: Not on file    Outpatient Medications Prior to Visit  Medication Sig Dispense Refill   MELATONIN PO Take by mouth.     sertraline (ZOLOFT) 50 MG tablet TAKE 1 AND 1/2 TABLETS BY  MOUTH DAILY 135 tablet 1   Magnesium 100 MG CAPS Take 1 capsule by mouth 2 (two) times daily.     naproxen sodium (ALEVE) 220 MG tablet Take 220 mg by mouth 2 (two) times daily as needed.     ondansetron (ZOFRAN) 8 MG tablet Take 1  tablet (8 mg total) by mouth every 8 (eight) hours as needed for nausea or vomiting. 20 tablet 0   colchicine 0.6 MG tablet Take 1 tablet (0.6 mg total) by mouth 2 (two) times daily. 60 tablet 2   predniSONE (DELTASONE) 20 MG tablet Take 3 tabs PO daily x 5 days. (Patient not taking: Reported on 01/21/2021) 15 tablet 0   No facility-administered medications prior to visit.    No Known Allergies  Review of Systems All review of systems negative except what is listed in the HPI     Objective:    Physical Exam Vitals reviewed.  Constitutional:      Appearance: Normal appearance. She is obese.  Musculoskeletal:        General: Swelling and tenderness present.     Comments: R great toe   Skin:    General: Skin is warm.     Capillary Refill: Capillary refill takes less than 2 seconds.     Findings: Erythema present.  Neurological:     General: No focal deficit present.     Mental Status: She is alert and oriented to person, place, and time. Mental status is at baseline.  Psychiatric:        Mood and Affect: Mood normal.        Behavior: Behavior normal.        Thought Content: Thought content normal.        Judgment: Judgment normal.    BP 132/81 (BP Location: Left Arm, Patient Position: Sitting, Cuff Size: Large)    Pulse 79    Temp 98.4 F (36.9 C) (Oral)    Ht 5\' 10"  (1.778 m)    Wt (!) 339 lb (153.8 kg)    SpO2 100%    BMI 48.64 kg/m  Wt Readings from Last 3 Encounters:  01/21/21 (!) 339 lb (153.8 kg)  12/24/20 (!) 340 lb (154.2 kg)  08/07/20 (!) 337 lb (152.9 kg)    Health Maintenance Due  Topic Date Due   COVID-19 Vaccine (1) Never done   INFLUENZA VACCINE  Never done    There are no preventive care reminders to display for this patient.   Lab Results  Component Value Date   TSH 1.72 11/06/2019   Lab Results  Component Value Date   WBC 6.6 11/06/2019   HGB 12.4 11/06/2019   HCT 38.4 11/06/2019   MCV 86.1 11/06/2019   PLT 358  11/06/2019   Lab Results   Component Value Date   NA 137 11/06/2019   K 4.0 11/06/2019   CO2 31 11/06/2019   GLUCOSE 99 11/06/2019   BUN 10 11/06/2019   CREATININE 0.69 11/06/2019   BILITOT 0.4 11/06/2019   ALKPHOS 78 08/05/2016   AST 12 11/06/2019   ALT 18 11/06/2019   PROT 7.3 11/06/2019   ALBUMIN 4.7 08/05/2016   CALCIUM 9.9 11/06/2019   Lab Results  Component Value Date   CHOL 193 02/27/2019   Lab Results  Component Value Date   HDL 45 (L) 02/27/2019   Lab Results  Component Value Date   LDLCALC 121 (H) 02/27/2019   Lab Results  Component Value Date   TRIG 159 (H) 02/27/2019   Lab Results  Component Value Date   CHOLHDL 4.3 02/27/2019   Lab Results  Component Value Date   HGBA1C 5.2 11/06/2019       Assessment & Plan:    1. Acute gout of right foot, unspecified cause Prednisone taper Refilled colchicine - take 1-2 tabs a day as needed for flares Review handout attached to AVS Come back about 2 weeks after flare has resolved to have uric acid levels checked    - Uric acid - colchicine 0.6 MG tablet; Take 1 tablet (0.6 mg total) by mouth 2 (two) times daily as needed.  Dispense: 60 tablet; Refill: 2 - predniSONE (DELTASONE) 20 MG tablet; Take 2 tablets (40 mg total) by mouth daily with breakfast for 2 days, THEN 1 tablet (20 mg total) daily with breakfast for 2 days, THEN 0.5 tablets (10 mg total) daily with breakfast for 2 days.  Dispense: 7 tablet; Refill: 0     Follow-up if symptoms worsen or fail to improve. Needs to establish care with new PCP.    Clayborne Dana, NP

## 2021-01-21 NOTE — Patient Instructions (Signed)
Prednisone taper Refilled colchicine - take 1-2 tabs a day as needed for flares Review handout attached  Come back about 2 weeks after flare has resolved to have uric acid levels checked

## 2021-03-04 ENCOUNTER — Ambulatory Visit: Payer: 59 | Admitting: Medical-Surgical

## 2021-03-04 ENCOUNTER — Encounter: Payer: Self-pay | Admitting: Advanced Practice Midwife

## 2021-03-04 ENCOUNTER — Ambulatory Visit (INDEPENDENT_AMBULATORY_CARE_PROVIDER_SITE_OTHER): Payer: 59 | Admitting: Advanced Practice Midwife

## 2021-03-04 ENCOUNTER — Encounter: Payer: Self-pay | Admitting: Medical-Surgical

## 2021-03-04 ENCOUNTER — Other Ambulatory Visit: Payer: Self-pay

## 2021-03-04 ENCOUNTER — Other Ambulatory Visit (HOSPITAL_COMMUNITY)
Admission: RE | Admit: 2021-03-04 | Discharge: 2021-03-04 | Disposition: A | Discharge status: Home / Self Care | Payer: 59 | Source: Ambulatory Visit | Attending: Advanced Practice Midwife | Admitting: Advanced Practice Midwife

## 2021-03-04 VITALS — BP 136/83 | HR 69 | Resp 20 | Ht 70.0 in | Wt 344.0 lb

## 2021-03-04 VITALS — BP 136/83 | Ht 70.0 in | Wt 344.0 lb

## 2021-03-04 DIAGNOSIS — N921 Excessive and frequent menstruation with irregular cycle: Secondary | ICD-10-CM

## 2021-03-04 DIAGNOSIS — Z01419 Encounter for gynecological examination (general) (routine) without abnormal findings: Secondary | ICD-10-CM | POA: Diagnosis not present

## 2021-03-04 DIAGNOSIS — Z7689 Persons encountering health services in other specified circumstances: Secondary | ICD-10-CM

## 2021-03-04 DIAGNOSIS — Z975 Presence of (intrauterine) contraceptive device: Secondary | ICD-10-CM

## 2021-03-04 DIAGNOSIS — Z1329 Encounter for screening for other suspected endocrine disorder: Secondary | ICD-10-CM

## 2021-03-04 DIAGNOSIS — N939 Abnormal uterine and vaginal bleeding, unspecified: Secondary | ICD-10-CM | POA: Diagnosis not present

## 2021-03-04 DIAGNOSIS — Z8742 Personal history of other diseases of the female genital tract: Secondary | ICD-10-CM

## 2021-03-04 DIAGNOSIS — Z8739 Personal history of other diseases of the musculoskeletal system and connective tissue: Secondary | ICD-10-CM

## 2021-03-04 DIAGNOSIS — Z0000 Encounter for general adult medical examination without abnormal findings: Secondary | ICD-10-CM

## 2021-03-04 DIAGNOSIS — R7303 Prediabetes: Secondary | ICD-10-CM | POA: Diagnosis not present

## 2021-03-04 NOTE — Progress Notes (Signed)
°  HPI with pertinent ROS:   CC: Transfer of care  HPI: Pleasant 36 year old female presenting today for transfer care to new PCP.  She is requesting to have her sugar checked.  History of prediabetes but most recently her A1c was 5.2%. Notes that all the women in her family have diabetes. She has recently gained quite a bit of weight and  is worried that her blood sugars have crept up.   Mood- taking Sertraline 50mg  daily. Has been on this medication for almost 5 years. Was previously told to take 75mg  but self-titrated the dose down because she would like to get off the medication. Does not feel that her anxiety and depressive symptoms are well controlled and often has ups and downs. Is doing marital counseling every 2 weeks but is looking to add individual counseling to the regimen. Has a lot of anxiety in general but also has some significant anxiety about her health. Wants basic blood work to make sure everything is ok. Using Ashwaghanda daily. Denies SI/HI.  Gout- uses colchicine prn but has not had to use it since December. No recent gout flares.   I reviewed the past medical history, family history, social history, surgical history, and allergies today and no changes were needed.  Please see the problem list section below in epic for further details.   Physical exam:   General: Well Developed, well nourished, and in no acute distress.  Neuro: Alert and oriented x3.  HEENT: Normocephalic, atraumatic.  Skin: Warm and dry. Cardiac: Regular rate and rhythm, no murmurs rubs or gallops, no lower extremity edema.  Respiratory: Clear to auscultation bilaterally. Not using accessory muscles, speaking in full sentences.  Impression and Recommendations:    1. Encounter to establish care Reviewed available information and discussed care concerns with patient.   2. Prediabetes Checking A1c.  - Hemoglobin A1c  3. History of gout Checking CMP and uric acid. Continue Colchicine for flares.   - Uric acid - COMPLETE METABOLIC PANEL WITH GFR  4. Preventative health care Checking labs. Recommend weight loss, regular intentional exercise, and a low fat heart healthy diet.  - CBC with Differential - Lipid panel  5. Thyroid disorder screen Checking TSH.  - TSH  6. Anxiety Continue Zoloft 50mg  daily. At this point, her symptoms are not well controlled and I feel that coming off of the medication would not be beneficial. She does not want to be on prescribed medication and is still interested in tapering off the Zoloft. Discussed safely tapering the medication with instructions to monitor for worsening s/s, SI, or HI. Continue plans for counseling. Ok to use Ashwaghanda. .  Return in about 6 months (around 09/01/2021) for Prediabetes/gout follow-up. ___________________________________________ Clearnce Sorrel, DNP, APRN, FNP-BC [facility] and [place]

## 2021-03-04 NOTE — Progress Notes (Signed)
Subjective:     Deshanna Kama is a 36 y.o. female here at [facility] for a routine exam.  Current complaints: frequent bleeding and cramping with IUD.  She also reports discharge with odor occasionally.  Personal health questionnaire reviewed: yes.  Do you have a primary care provider? yes Do you feel safe at home? yes  Flowsheet Row Office Visit from 03/04/2021 in [facility]  PHQ-2 Total Score 0       Health Maintenance Due  Topic Date Due   Hepatitis C Screening  Never done     Risk factors for chronic health problems: Smoking: Alchohol/how much: Pt BMI: Body mass index is 49.36 kg/m.   Gynecologic History No LMP recorded. (Menstrual status: IUD). Contraception: IUD Last Pap: 03/08/2019. Results were: normal, negative HPV Last mammogram: n/a  Obstetric History OB History  Gravida Para Term Preterm AB Living  2 2 2     2   SAB IAB Ectopic Multiple Live Births          2    # Outcome Date GA Lbr Len/2nd Weight Sex Delivery Anes PTL Lv  2 Term 10/03/09 [redacted]w[redacted]d   M Vag-Spont   LIV  1 Term              The following portions of the patient's history were reviewed and updated as appropriate: allergies, current medications, past family history, past medical history, past social history, past surgical history, and problem list.  Review of Systems Pertinent items noted in HPI and remainder of comprehensive ROS otherwise negative.    Objective:   BP 136/83    Ht 5\' 10"  (1.778 m)    Wt (!) 344 lb (156 kg)    BMI 49.36 kg/m  VS reviewed, nursing note reviewed,  Constitutional: well developed, well nourished, no distress HEENT: normocephalic CV: normal rate Pulm/chest wall: normal effort Breast Exam:  exam performed: right breast normal without mass, skin or nipple changes or axillary nodes, left breast normal without mass, skin or nipple changes or axillary nodes Abdomen: soft Neuro: alert and oriented x 3 Skin: warm,  dry Psych: affect normal Pelvic exam: Performed: Cervix pink, visually closed, without lesion, scant white creamy discharge, vaginal walls and external genitalia normal Bimanual exam: Cervix 0/long/high, firm, anterior, neg CMT, uterus nontender, nonenlarged, adnexa without tenderness, enlargement, or mass       Assessment/Plan:   1. Well woman exam  - Cytology - PAP( [place])  2. Breakthrough bleeding associated with intrauterine device (IUD) --Frequent bleeding with associated cramping.  Pt with hx AUB, IUD was placed to thin uterine lining, reduce bleeding.  No heavy menses but bleeding almost daily, enough to require pad/tampon/panty liner daily. --Discussed options.  Offered OCPs to regulate menses. Pt desires to avoid additional hormones if possible.  Pt reports weight gain of 20+ lbs recently which  may be affecting menses.   --Pt to try lifestyle changes with exercise, healthy low-carb eating x 2 months then follow up in office.    3. Abnormal uterine bleeding (AUB)   4. History of abnormal cervical Pap smear --Pt last Pap in 2021 was normal with neg hpv but hx hpv with normal Pap on previous pap, and also abnormal Pap with hpv positive on Pap in followed by biopsy with normal findings.  --Will repeat Pap today, if normal and neg hpv, will discuss with pt and per ASCCP guidelines recommend Q 3 year Pap   Follow up  in: 2 months or as needed.   Sharen Counter, CNM 11:02 AM

## 2021-03-04 NOTE — Patient Instructions (Signed)
Reasons why it is important to allow yourself to process and experience true feelings: When you numb sadness, you also numb happiness and Toshiko Kemler. Struggling with your emotions often leads to more suffering. Processing and experiencing your feelings is a part of having a full life.    Coping skills are things we can do to make ourselves feel better when we are going through difficult times. Examples of coping skills include:  Take a deep breath Count to 20 Listen to music Call a friend Take a walk Read a book Do a puzzle Meditate Journal Exercise Stretch Sing Bake Knit Go outside Garden Pray Color Send a note Take a bath Watch a movie Pet an animal Visit a friend Be alone in a quiet place   When your anxiety gets worse, try these grounding techniques:      If you need additional help, please contact your primary care provider or call one of the resources listed below:  [facility]  24-hour HelpLine at 336-832-9700 or 800-711-2635 [address] [place], [place] [address]  National [employer] 800-SUICIDE or 800-784-2433  The National Suicide Prevention Lifeline 800-273-TALK or 800-273-8255  [facility] https://www.celebraterecovery.com/   

## 2021-03-04 NOTE — Patient Instructions (Signed)
Some natural remedies/prevention to try for bacterial vaginosis: --Take a probiotic tablet/capsule every day for at least 1-2 months.   --Whenever you have symptoms, use boric acid suppositories or tampons with coconut oil and 2-3 drops of tea tree oil vaginally every night for a week.   --Do not use scented soaps/perfumes in the vaginal area, and do not overwash multiple times daily. --Wear breathable cotton underwear and do not wear tight restrictive clothing. --Limit pantyliner use, change your underwear several times daily instead. --Use condoms during intercourse.  

## 2021-03-05 LAB — CBC WITH DIFFERENTIAL/PLATELET
Absolute Monocytes: 528 cells/uL (ref 200–950)
Basophils Absolute: 72 cells/uL (ref 0–200)
Basophils Relative: 0.9 %
Eosinophils Absolute: 208 cells/uL (ref 15–500)
Eosinophils Relative: 2.6 %
HCT: 40.1 % (ref 35.0–45.0)
Hemoglobin: 13.5 g/dL (ref 11.7–15.5)
Lymphs Abs: 2024 cells/uL (ref 850–3900)
MCH: 30.3 pg (ref 27.0–33.0)
MCHC: 33.7 g/dL (ref 32.0–36.0)
MCV: 89.9 fL (ref 80.0–100.0)
MPV: 9.9 fL (ref 7.5–12.5)
Monocytes Relative: 6.6 %
Neutro Abs: 5168 cells/uL (ref 1500–7800)
Neutrophils Relative %: 64.6 %
Platelets: 404 10*3/uL — ABNORMAL HIGH (ref 140–400)
RBC: 4.46 10*6/uL (ref 3.80–5.10)
RDW: 12.4 % (ref 11.0–15.0)
Total Lymphocyte: 25.3 %
WBC: 8 10*3/uL (ref 3.8–10.8)

## 2021-03-05 LAB — LIPID PANEL
Cholesterol: 164 mg/dL (ref <200)
HDL: 44 mg/dL — ABNORMAL LOW (ref >=50)
LDL Cholesterol (Calc): 99 mg/dL (calc)
Non-HDL Cholesterol (Calc): 120 mg/dL (calc) (ref <130)
Total CHOL/HDL Ratio: 3.7 (calc) (ref <5.0)
Triglycerides: 114 mg/dL (ref <150)

## 2021-03-05 LAB — COMPLETE METABOLIC PANEL WITH GFR
AG Ratio: 1.6 (calc) (ref 1.0–2.5)
ALT: 15 U/L (ref 6–29)
AST: 12 U/L (ref 10–30)
Albumin: 4.5 g/dL (ref 3.6–5.1)
Alkaline phosphatase (APISO): 71 U/L (ref 31–125)
BUN: 16 mg/dL (ref 7–25)
CO2: 28 mmol/L (ref 20–32)
Calcium: 9.9 mg/dL (ref 8.6–10.2)
Chloride: 102 mmol/L (ref 98–110)
Creat: 0.71 mg/dL (ref 0.50–0.97)
Globulin: 2.8 g/dL (calc) (ref 1.9–3.7)
Glucose, Bld: 101 mg/dL — ABNORMAL HIGH (ref 65–99)
Potassium: 4.2 mmol/L (ref 3.5–5.3)
Sodium: 138 mmol/L (ref 135–146)
Total Bilirubin: 0.6 mg/dL (ref 0.2–1.2)
Total Protein: 7.3 g/dL (ref 6.1–8.1)
eGFR: 112 mL/min/{1.73_m2} (ref >=60)

## 2021-03-05 LAB — URIC ACID: Uric Acid, Serum: 8.8 mg/dL — ABNORMAL HIGH (ref 2.5–7.0)

## 2021-03-05 LAB — HEMOGLOBIN A1C
Hgb A1c MFr Bld: 5.7 % of total Hgb — ABNORMAL HIGH (ref <5.7)
Mean Plasma Glucose: 117 mg/dL
eAG (mmol/L): 6.5 mmol/L

## 2021-03-05 LAB — TSH: TSH: 1.48 mIU/L

## 2021-03-05 MED ORDER — ALLOPURINOL 100 MG PO TABS: 100.0000 mg | ORAL_TABLET | Freq: Every day | ORAL | 1 refills | Status: DC

## 2021-03-05 NOTE — Addendum Note (Signed)
Addended byChristen Butter on: 03/05/2021 02:17 PM   Modules accepted: Orders

## 2021-03-06 LAB — CYTOLOGY - PAP
Comment: NEGATIVE
Diagnosis: UNDETERMINED — AB
High risk HPV: NEGATIVE

## 2021-03-13 ENCOUNTER — Encounter: Payer: Self-pay | Admitting: Medical-Surgical

## 2021-03-13 ENCOUNTER — Encounter: Payer: Self-pay | Admitting: Advanced Practice Midwife

## 2021-03-18 ENCOUNTER — Encounter: Payer: Self-pay | Admitting: Advanced Practice Midwife

## 2021-03-18 DIAGNOSIS — R87610 Atypical squamous cells of undetermined significance on cytologic smear of cervix (ASC-US): Secondary | ICD-10-CM | POA: Insufficient documentation

## 2021-04-14 ENCOUNTER — Telehealth: Payer: 59 | Admitting: Sports Medicine

## 2021-04-14 DIAGNOSIS — M109 Gout, unspecified: Secondary | ICD-10-CM

## 2021-04-14 MED ORDER — ALLOPURINOL 300 MG PO TABS: 300.0000 mg | ORAL_TABLET | Freq: Every day | ORAL | 3 refills | Status: DC

## 2021-04-14 MED ORDER — PREDNISONE 50 MG PO TABS: ORAL_TABLET | ORAL | 0 refills | Status: DC

## 2021-04-14 MED ORDER — HYDROCODONE-ACETAMINOPHEN 5-325 MG PO TABS: 1.0000 | ORAL_TABLET | Freq: Three times a day (TID) | ORAL | 0 refills | Status: DC | PRN

## 2021-04-14 NOTE — Progress Notes (Addendum)
? ?  Virtual Visit via Telephone ?  ?I connected with  Talishia Bergman  on 05/18/21 by telephone/telehealth and verified that I am speaking with the correct person using two identifiers. ?  ?I discussed the limitations, risks, security and privacy concerns of performing an evaluation and management service by telephone, including the higher likelihood of inaccurate diagnosis and treatment, and the availability of in person appointments.  We also discussed the likely need of an additional face to face encounter for complete and high quality delivery of care.  I also discussed with the patient that there may be a patient responsible charge related to this service. The patient expressed understanding and wishes to proceed. ? ?Provider location is in medical facility. ?Patient location is at their home, different from provider location. ?People involved in care of the patient during this telehealth encounter were myself, my nurse/medical assistant, and my front office/scheduling team member. ? ?Review of Systems: No fevers, chills, night sweats, weight loss, chest pain, or shortness of breath.  ? ?Objective Findings:   ? ?General: Speaking full sentences, no audible heavy breathing.  Sounds alert and appropriately interactive.   ? ?Independent interpretation of tests performed by another provider:  ? ?None. ? ?Brief History, Exam, Impression, and Recommendations:   ? ?Gout of big toe ?Lux is a pleasant 36 year old female, history of gout/podagra, elevated uric acid levels recently. ?Currently on colchicine, allopurinol 100 daily. ?Unfortunately having a recurrence of a flare of podagra. ?She has been taking colchicine but doing it 3 times a day, cautioned against this. ?She has also been doing a weight loss diet that does include some high purine foods such as meats. ?I think she should continue her current diet and we will simply try to block the gout with allopurinol, we will add a course of steroids,  hydrocodone. ?Continue colchicine but only at twice daily for the next week. ?I am also going to increase her allopurinol to 300 mg once daily. ?In a month when the flare has resolved she can recheck her uric acid levels with a titration of allopurinol for a goal of less than 5. ?Labs do not need to be fasting, she will follow-up with me in 4 to 6 weeks. ? ?Update: Allopurinol levels are still relatively high, we will increase to 300 mg allopurinol pill, taken twice daily with a 4-week recheck on uric acid, goal less than 5. ? ? ?I discussed the above assessment and treatment plan with the patient. The patient was provided an opportunity to ask questions and all were answered. The patient agreed with the plan and demonstrated an understanding of the instructions. ?  ?The patient was advised to call back or seek an in-person evaluation if the symptoms worsen or if the condition fails to improve as anticipated. ?  ?I provided 30 minutes of verbal and non-verbal time during this encounter date, time was needed to gather information, review chart, records, communicate/coordinate with staff remotely, as well as complete documentation. ? ? ?___________________________________________ ?Ihor Austin. Benjamin Stain, M.D., ABFM., CAQSM. ?Primary Care and Sports Medicine ?[facility] ? ?Adjunct Professor of Family Medicine  ?[employer] ? ?

## 2021-04-14 NOTE — Assessment & Plan Note (Addendum)
Regina Andrews is a pleasant 36 year old female, history of gout/podagra, elevated uric acid levels recently. ?Currently on colchicine, allopurinol 100 daily. ?Unfortunately having a recurrence of a flare of podagra. ?She has been taking colchicine but doing it 3 times a day, cautioned against this. ?She has also been doing a weight loss diet that does include some high purine foods such as meats. ?I think she should continue her current diet and we will simply try to block the gout with allopurinol, we will add a course of steroids, hydrocodone. ?Continue colchicine but only at twice daily for the next week. ?I am also going to increase her allopurinol to 300 mg once daily. ?In a month when the flare has resolved she can recheck her uric acid levels with a titration of allopurinol for a goal of less than 5. ?Labs do not need to be fasting, she will follow-up with me in 4 to 6 weeks. ? ?Update: Allopurinol levels are still relatively high, we will increase to 300 mg allopurinol pill, taken twice daily with a 4-week recheck on uric acid, goal less than 5. ?

## 2021-04-14 NOTE — Progress Notes (Signed)
Regina Andrews has been dealing with a gout flare up for the past 4 days. She is having joint pain and swelling in her left foot. She is currently taking allopurinol daily and colchicine twice daily. ?

## 2021-05-13 ENCOUNTER — Ambulatory Visit: Payer: 59 | Admitting: Advanced Practice Midwife

## 2021-05-14 NOTE — Progress Notes (Signed)
Virtual Visit via Video Note ? ?I connected with Regina Andrews on 05/15/21 at  1:00 PM EDT by a video enabled telemedicine application and verified that I am speaking with the correct person using two identifiers. ?  ?I discussed the limitations of evaluation and management by telemedicine and the availability of in person appointments. The patient expressed understanding and agreed to proceed. ? ?Patient location: home ?Provider locations: office ? ?Subjective:   ? ?CC: discuss weight loss ? ?HPI: ?Pleasant 36 year old female presenting today to discuss weight loss options. She is interested in medications but only on a short term basis. She previously talked to her prior PCP regarding Ozempic but was very hesitant about trying it due to fear of being stuck on it. Has a history of elevated cholesterol and recent labs showed prediabetes. She has been working on dietary changes but has not been able to exercise regularly due to her foot pain from gout.  ? ?Reports that her fingers intermittently turn white and go numb. This only lasts for a short while and then spontaneously resolves. She and her significant other looked it up online and it was consistent with Raynaud's phenomenon. While investigating she ran across other information related to lupus and found that she also has the facial redness, joint pains, and fatigue that are commonly found in lupus. Would like to have blood work to test for this.  ? ?Past medical history, Surgical history, Family history not pertinant except as noted below, Social history, Allergies, and medications have been entered into the medical record, reviewed, and corrections made.  ? ?Review of Systems: See HPI for pertinent positives and negatives.  ? ?Objective:   ? ?General: Speaking clearly in complete sentences without any shortness of breath.  Alert and oriented x3.  Normal judgment. No apparent acute distress. ? ?Impression and Recommendations:   ? ?1. Encounter for weight  management ?Reviewed options for weight loss. Ultimately, it is very important for her to understand that weight loss and maintaining it will take lifelong diet and activity modifications rather than a short term medication. She would like to try Ozempic to see if her insurance will cover it so sending in the 0.23m weekly dose.  ? ?2. Raynaud's disease without gangrene ?Information regarding Raynaud's sent to patient via MyChart message for her review.  ? ?3. Malar rash ?4. Polyarthralgia ?5. Fatigue, unspecified type ?Unclear if these are correlating symptoms or if they are simply coincidental. Adding labs to evaluate for lupus per patient request.  ?- C-reactive protein ?- Sed Rate (ESR) ?- LUPUS(12) PANEL ? ?I discussed the assessment and treatment plan with the patient. The patient was provided an opportunity to ask questions and all were answered. The patient agreed with the plan and demonstrated an understanding of the instructions. ?  ?The patient was advised to call back or seek an in-person evaluation if the symptoms worsen or if the condition fails to improve as anticipated. ? ?25 minutes of non-face-to-face time was provided during this encounter. ? ?Return in about 6 weeks (around 06/26/2021) for weight follow up if a medication gets started. ? ?JClearnce Sorrel DNP, APRN, FNP-BC ?[facility]?Primary Care and Sports Medicine ?

## 2021-05-15 ENCOUNTER — Telehealth: Payer: 59 | Admitting: Medical-Surgical

## 2021-05-15 ENCOUNTER — Encounter: Payer: Self-pay | Admitting: Medical-Surgical

## 2021-05-15 VITALS — Wt 343.6 lb

## 2021-05-15 DIAGNOSIS — R21 Rash and other nonspecific skin eruption: Secondary | ICD-10-CM | POA: Diagnosis not present

## 2021-05-15 DIAGNOSIS — M2550 Pain in unspecified joint: Secondary | ICD-10-CM

## 2021-05-15 DIAGNOSIS — Z7689 Persons encountering health services in other specified circumstances: Secondary | ICD-10-CM | POA: Diagnosis not present

## 2021-05-15 DIAGNOSIS — I7300 Raynaud's syndrome without gangrene: Secondary | ICD-10-CM | POA: Diagnosis not present

## 2021-05-15 DIAGNOSIS — R5383 Other fatigue: Secondary | ICD-10-CM

## 2021-05-15 MED ORDER — SEMAGLUTIDE(0.25 OR 0.5MG/DOS) 2 MG/3ML ~~LOC~~ SOPN: 0.2500 mg | PEN_INJECTOR | SUBCUTANEOUS | 0 refills | Status: DC

## 2021-05-17 LAB — COMPREHENSIVE METABOLIC PANEL
AG Ratio: 1.6 (calc) (ref 1.0–2.5)
ALT: 17 U/L (ref 6–29)
AST: 14 U/L (ref 10–30)
Albumin: 4.4 g/dL (ref 3.6–5.1)
Alkaline phosphatase (APISO): 74 U/L (ref 31–125)
BUN: 11 mg/dL (ref 7–25)
CO2: 29 mmol/L (ref 20–32)
Calcium: 9.6 mg/dL (ref 8.6–10.2)
Chloride: 100 mmol/L (ref 98–110)
Creat: 0.68 mg/dL (ref 0.50–0.97)
Globulin: 2.7 g/dL (calc) (ref 1.9–3.7)
Glucose, Bld: 112 mg/dL — ABNORMAL HIGH (ref 65–99)
Potassium: 4.3 mmol/L (ref 3.5–5.3)
Sodium: 137 mmol/L (ref 135–146)
Total Bilirubin: 0.5 mg/dL (ref 0.2–1.2)
Total Protein: 7.1 g/dL (ref 6.1–8.1)

## 2021-05-17 LAB — URIC ACID: Uric Acid, Serum: 6.7 mg/dL (ref 2.5–7.0)

## 2021-05-18 MED ORDER — ALLOPURINOL 300 MG PO TABS: 300.0000 mg | ORAL_TABLET | Freq: Two times a day (BID) | ORAL | 3 refills | Status: DC

## 2021-05-18 NOTE — Addendum Note (Signed)
Addended by: Monica Becton on: 05/18/2021 03:10 PM ? ? Modules accepted: Orders ? ?

## 2021-05-19 LAB — LUPUS(12) PANEL
Anti Nuclear Antibody (ANA): POSITIVE — AB
C3 Complement: 164 mg/dL (ref 83–193)
C4 Complement: 26 mg/dL (ref 15–57)
ENA SM Ab Ser-aCnc: <1 AI
Rheumatoid fact SerPl-aCnc: <14 IU/mL (ref <14)
Ribosomal P Protein Ab: <1 AI
SM/RNP: <1 AI
SSA (Ro) (ENA) Antibody, IgG: <1 AI
SSB (La) (ENA) Antibody, IgG: <1 AI
Scleroderma (Scl-70) (ENA) Antibody, IgG: <1 AI
Thyroperoxidase Ab SerPl-aCnc: <1 IU/mL (ref <9)
ds DNA Ab: <1 IU/mL

## 2021-05-19 LAB — ANTI-NUCLEAR AB-TITER (ANA TITER): ANA Titer 1: 1:160 {titer} — ABNORMAL HIGH

## 2021-05-19 LAB — SEDIMENTATION RATE: Sed Rate: 11 mm/h (ref 0–20)

## 2021-05-19 LAB — C-REACTIVE PROTEIN: CRP: 11.3 mg/L — ABNORMAL HIGH (ref <8.0)

## 2021-05-20 ENCOUNTER — Encounter: Payer: Self-pay | Admitting: Medical-Surgical

## 2021-05-23 ENCOUNTER — Ambulatory Visit: Payer: 59 | Admitting: Sports Medicine

## 2021-05-23 ENCOUNTER — Telehealth: Payer: Self-pay

## 2021-05-23 ENCOUNTER — Ambulatory Visit (INDEPENDENT_AMBULATORY_CARE_PROVIDER_SITE_OTHER): Payer: 59

## 2021-05-23 DIAGNOSIS — M25572 Pain in left ankle and joints of left foot: Secondary | ICD-10-CM | POA: Diagnosis not present

## 2021-05-23 DIAGNOSIS — M109 Gout, unspecified: Secondary | ICD-10-CM

## 2021-05-23 DIAGNOSIS — L719 Rosacea, unspecified: Secondary | ICD-10-CM | POA: Diagnosis not present

## 2021-05-23 MED ORDER — PREDNISONE 50 MG PO TABS: ORAL_TABLET | ORAL | 0 refills | Status: DC

## 2021-05-23 MED ORDER — METRONIDAZOLE 0.75 % EX GEL: 1.0000 "application " | Freq: Two times a day (BID) | CUTANEOUS | 3 refills | Status: DC

## 2021-05-23 NOTE — Assessment & Plan Note (Addendum)
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 ?

## 2021-05-23 NOTE — Assessment & Plan Note (Signed)
This is a pleasant 36 year old female, she has what sounded to be a gout flare in the great toe, prednisone did relieve her symptoms, today she does have some tenderness at the first MTP on the left with erythema, this was present for a month. ?Considering the duration of symptomatology I think there is likely some osteoarthritis as well, we will check her labs again today, she has a family history of rheumatoid arthritis and she only had her ANA checked so we will add rheumatoid testing, uric acid testing. ?5 days of prednisone. ?Return to see me in a month, she will get more consistent with her allopurinol dosing and we can likely recheck uric acid levels again in a month. ?I have also asked her to get a Morton's plate off of [employer] to put in her shoe to reduce motion in the great toe. ?

## 2021-05-23 NOTE — Telephone Encounter (Signed)
Initiated Prior authorization for: Ozempic (0.25 or 0.5 MG/DOSE) 2MG /1.5ML pen-injectors ?Via: Covermymeds ?Case/Key: [id number] ?Status: approved  as of 05/23/21 ?Reason:approved through 05/24/2022 ?Notified Pt via: Mychart ? ? ?

## 2021-05-23 NOTE — Progress Notes (Signed)
? ? ?  Procedures performed today:   ? ?None. ? ?Independent interpretation of notes and tests performed by another provider:  ? ?None. ? ?Brief History, Exam, Impression, and Recommendations:   ? ?Gout of big toe ?This is a pleasant 35 year old female, she has what sounded to be a gout flare in the great toe, prednisone did relieve her symptoms, today she does have some tenderness at the first MTP on the left with erythema, this was present for a month. ?Considering the duration of symptomatology I think there is likely some osteoarthritis as well, we will check her labs again today, she has a family history of rheumatoid arthritis and she only had her ANA checked so we will add rheumatoid testing, uric acid testing. ?5 days of prednisone. ?Return to see me in a month, she will get more consistent with her allopurinol dosing and we can likely recheck uric acid levels again in a month. ?I have also asked her to get a Morton's plate off of [employer] to put in her shoe to reduce motion in the great toe. ? 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 ? ? ? ?___________________________________________ ?Ihor Austin. Benjamin Stain, M.D., ABFM., CAQSM. ?Primary Care and Sports Medicine ?[facility] ? ?Adjunct Instructor of Family Medicine  ?[employer] ?

## 2021-05-24 ENCOUNTER — Encounter: Payer: Self-pay | Admitting: Sports Medicine

## 2021-05-27 LAB — RHEUMATOID ARTHRITIS DIAGNOSTIC PANEL, COMPREHENSIVE
Cyclic Citrullin Peptide Ab: <16 Units (ref <20)
Rheumatoid Factor (IgA): <5 U (ref <=6)
Rheumatoid Factor (IgG): <5 U (ref <=6)
Rheumatoid Factor (IgM): 7 U — ABNORMAL HIGH (ref <=6)
SSA (Ro) (ENA) Antibody, IgG: <1 AI
SSB (La) (ENA) Antibody, IgG: <1 AI

## 2021-05-27 LAB — URIC ACID: Uric Acid, Serum: 6.2 mg/dL (ref 2.5–7.0)

## 2021-06-20 ENCOUNTER — Ambulatory Visit: Payer: 59 | Admitting: Sports Medicine

## 2021-06-27 ENCOUNTER — Ambulatory Visit: Payer: 59 | Admitting: Obstetrics and Gynecology

## 2021-06-29 ENCOUNTER — Telehealth: Payer: 59 | Admitting: Nurse Practitioner

## 2021-06-29 DIAGNOSIS — J208 Acute bronchitis due to other specified organisms: Secondary | ICD-10-CM

## 2021-06-29 DIAGNOSIS — B9689 Other specified bacterial agents as the cause of diseases classified elsewhere: Secondary | ICD-10-CM

## 2021-06-29 MED ORDER — AZITHROMYCIN 250 MG PO TABS: ORAL_TABLET | ORAL | 0 refills | Status: AC

## 2021-06-29 MED ORDER — PROMETHAZINE-DM 6.25-15 MG/5ML PO SYRP: 5.0000 mL | ORAL_SOLUTION | Freq: Four times a day (QID) | ORAL | 0 refills | Status: DC | PRN

## 2021-06-29 MED ORDER — PREDNISONE 10 MG PO TABS: ORAL_TABLET | ORAL | 0 refills | Status: DC

## 2021-06-29 NOTE — Progress Notes (Signed)
Virtual Visit Consent   Regina Andrews, you are scheduled for a virtual visit with a [facility] provider today. Just as with appointments in the office, your consent must be obtained to participate. Your consent will be active for this visit and any virtual visit you may have with one of our providers in the next 365 days. If you have a MyChart account, a copy of this consent can be sent to you electronically.  As this is a virtual visit, video technology does not allow for your provider to perform a traditional examination. This may limit your provider's ability to fully assess your condition. If your provider identifies any concerns that need to be evaluated in person or the need to arrange testing (such as labs, EKG, etc.), we will make arrangements to do so. Although advances in technology are sophisticated, we cannot ensure that it will always work on either your end or our end. If the connection with a video visit is poor, the visit may have to be switched to a telephone visit. With either a video or telephone visit, we are not always able to ensure that we have a secure connection.  By engaging in this virtual visit, you consent to the provision of healthcare and authorize for your insurance to be billed (if applicable) for the services provided during this visit. Depending on your insurance coverage, you may receive a charge related to this service.  I need to obtain your verbal consent now. Are you willing to proceed with your visit today? [name] has provided verbal consent on 06/29/2021 for a virtual visit (video or telephone). Claiborne RiggZelda W Ayeshia Coppin, NP  Date: 06/29/2021 7:12 PM  Virtual Visit via Video Note   I, Claiborne RiggZelda W Alexander Mcauley, connected with  [employer] Weyandt  ([id number], Jun 09, 1985) on 06/29/21 at  7:00 PM EDT by a video-enabled telemedicine application and verified that I am speaking with the correct person using two identifiers.  Location: Patient: Virtual Visit Location Patient:  Home Provider: Virtual Visit Location Provider: Home Office   I discussed the limitations of evaluation and management by telemedicine and the availability of in person appointments. The patient expressed understanding and agreed to proceed.    History of Present Illness: Regina Andrews is a 36 y.o. who identifies as a female who was assigned female at birth, and is being seen today for bacterial bronchitis.  She currently endorses  bilateral ear congestion, left ear pain, nasal congestion, productive cough, rhinorrhea and PND, and sore throat. Symptoms began 7 days ago and are gradually worsening since that time.     Problems:  Patient Active Problem List   Diagnosis Date Noted   Rosacea 05/23/2021   Gout of big toe 04/14/2021   ASCUS of cervix with negative high risk HPV 03/18/2021   History of abnormal cervical Pap smear 03/04/2021   IUD check up 10/18/2019   Abnormal uterine bleeding (AUB) 07/02/2019   Pain of left thumb 08/05/2016   Weight gain 08/05/2016   Worsening headaches 09/09/2015   Von Willebrand disease, type IIa (HCC) 07/12/2015   History of dysphagia 06/20/2015   Vasovagal near syncope 06/11/2015   Generalized anxiety disorder 04/16/2015   Anxiety state 04/10/2015   History of PCOS 04/10/2015   History of goiter 04/10/2015   History of breast augmentation 04/10/2015   History of abdominoplasty 04/10/2015   History of cholecystectomy 04/10/2015   History of tonsillectomy 04/10/2015    Allergies: No Known Allergies Medications:  Current Outpatient Medications:    azithromycin (  ZITHROMAX) 250 MG tablet, Take 2 tablets on day 1, then 1 tablet daily on days 2 through 5, Disp: 6 tablet, Rfl: 0   predniSONE (DELTASONE) 10 MG tablet, Directions for 6 day taper: Day 1: 2 tablets before breakfast, 1 after both lunch & dinner and 2 at bedtime Day 2: 1 tab before breakfast, 1 after both lunch & dinner and 2 at bedtime Day 3: 1 tab at each meal & 1 at bedtime Day 4: 1 tab at  breakfast, 1 at lunch, 1 at bedtime Day 5: 1 tab at breakfast & 1 tab at bedtime Day 6: 1 tab at breakfast, Disp: 21 tablet, Rfl: 0   promethazine-dextromethorphan (PROMETHAZINE-DM) 6.25-15 MG/5ML syrup, Take 5 mLs by mouth 4 (four) times daily as needed for cough., Disp: 240 mL, Rfl: 0   allopurinol (ZYLOPRIM) 300 MG tablet, Take 1 tablet (300 mg total) by mouth 2 (two) times daily., Disp: 60 tablet, Rfl: 3   Barberry-Oreg Grape-Goldenseal (BERBERINE COMPLEX PO), Take by mouth., Disp: , Rfl:    colchicine 0.6 MG tablet, Take 1 tablet (0.6 mg total) by mouth 2 (two) times daily as needed., Disp: 60 tablet, Rfl: 2   Magnesium 100 MG CAPS, Take 1 capsule by mouth 2 (two) times daily., Disp: , Rfl:    MELATONIN PO, Take by mouth., Disp: , Rfl:    metroNIDAZOLE (METROGEL) 0.75 % gel, Apply 1 application. topically 2 (two) times daily., Disp: 45 g, Rfl: 3   Multiple Vitamin (MULTI-VITAMIN DAILY PO), Take by mouth., Disp: , Rfl:    naproxen sodium (ALEVE) 220 MG tablet, Take 220 mg by mouth 2 (two) times daily as needed., Disp: , Rfl:    predniSONE (DELTASONE) 50 MG tablet, One tab PO daily for 5 days., Disp: 5 tablet, Rfl: 0   Semaglutide,0.25 or 0.5MG /DOS, 2 MG/3ML SOPN, Inject 0.25 mg into the skin once a week., Disp: 3 mL, Rfl: 0   sertraline (ZOLOFT) 50 MG tablet, TAKE 1 AND 1/2 TABLETS BY  MOUTH DAILY, Disp: 135 tablet, Rfl: 1  Observations/Objective: Patient is well-developed, well-nourished in no acute distress.  Resting comfortably at home.  Head is normocephalic, atraumatic.  No labored breathing.  Speech is clear and coherent with logical content.  Patient is alert and oriented at baseline.    Assessment and Plan: 1. Acute bacterial bronchitis - predniSONE (DELTASONE) 10 MG tablet; Directions for 6 day taper: Day 1: 2 tablets before breakfast, 1 after both lunch & dinner and 2 at bedtime Day 2: 1 tab before breakfast, 1 after both lunch & dinner and 2 at bedtime Day 3: 1 tab at each  meal & 1 at bedtime Day 4: 1 tab at breakfast, 1 at lunch, 1 at bedtime Day 5: 1 tab at breakfast & 1 tab at bedtime Day 6: 1 tab at breakfast  Dispense: 21 tablet; Refill: 0 - azithromycin (ZITHROMAX) 250 MG tablet; Take 2 tablets on day 1, then 1 tablet daily on days 2 through 5  Dispense: 6 tablet; Refill: 0 - promethazine-dextromethorphan (PROMETHAZINE-DM) 6.25-15 MG/5ML syrup; Take 5 mLs by mouth 4 (four) times daily as needed for cough.  Dispense: 240 mL; Refill: 0  INSTRUCTIONS: use a humidifier for nasal congestion Drink plenty of fluids, rest and wash hands frequently to avoid the spread of infection Alternate tylenol and Motrin for relief of fever   Follow Up Instructions: I discussed the assessment and treatment plan with the patient. The patient was provided an opportunity to ask questions and  all were answered. The patient agreed with the plan and demonstrated an understanding of the instructions.  A copy of instructions were sent to the patient via MyChart unless otherwise noted below.     The patient was advised to call back or seek an in-person evaluation if the symptoms worsen or if the condition fails to improve as anticipated.  Time:  I spent 11 minutes with the patient via telehealth technology discussing the above problems/concerns.    Claiborne Rigg, NP

## 2021-06-29 NOTE — Patient Instructions (Addendum)
[employer], thank you for joining Claiborne Rigg, NP for today's virtual visit.  While this provider is not your primary care provider (PCP), if your PCP is located in our provider database this encounter information will be shared with them immediately following your visit.  Consent: (Patient) Regina Andrews provided verbal consent for this virtual visit at the beginning of the encounter.  Current Medications:  Current Outpatient Medications:    azithromycin (ZITHROMAX) 250 MG tablet, Take 2 tablets on day 1, then 1 tablet daily on days 2 through 5, Disp: 6 tablet, Rfl: 0   predniSONE (DELTASONE) 10 MG tablet, Directions for 6 day taper: Day 1: 2 tablets before breakfast, 1 after both lunch & dinner and 2 at bedtime Day 2: 1 tab before breakfast, 1 after both lunch & dinner and 2 at bedtime Day 3: 1 tab at each meal & 1 at bedtime Day 4: 1 tab at breakfast, 1 at lunch, 1 at bedtime Day 5: 1 tab at breakfast & 1 tab at bedtime Day 6: 1 tab at breakfast, Disp: 21 tablet, Rfl: 0   promethazine-dextromethorphan (PROMETHAZINE-DM) 6.25-15 MG/5ML syrup, Take 5 mLs by mouth 4 (four) times daily as needed for cough., Disp: 240 mL, Rfl: 0   allopurinol (ZYLOPRIM) 300 MG tablet, Take 1 tablet (300 mg total) by mouth 2 (two) times daily., Disp: 60 tablet, Rfl: 3   Barberry-Oreg Grape-Goldenseal (BERBERINE COMPLEX PO), Take by mouth., Disp: , Rfl:    colchicine 0.6 MG tablet, Take 1 tablet (0.6 mg total) by mouth 2 (two) times daily as needed., Disp: 60 tablet, Rfl: 2   Magnesium 100 MG CAPS, Take 1 capsule by mouth 2 (two) times daily., Disp: , Rfl:    MELATONIN PO, Take by mouth., Disp: , Rfl:    metroNIDAZOLE (METROGEL) 0.75 % gel, Apply 1 application. topically 2 (two) times daily., Disp: 45 g, Rfl: 3   Multiple Vitamin (MULTI-VITAMIN DAILY PO), Take by mouth., Disp: , Rfl:    naproxen sodium (ALEVE) 220 MG tablet, Take 220 mg by mouth 2 (two) times daily as needed., Disp: , Rfl:    predniSONE  (DELTASONE) 50 MG tablet, One tab PO daily for 5 days., Disp: 5 tablet, Rfl: 0   Semaglutide,0.25 or 0.5MG /DOS, 2 MG/3ML SOPN, Inject 0.25 mg into the skin once a week., Disp: 3 mL, Rfl: 0   sertraline (ZOLOFT) 50 MG tablet, TAKE 1 AND 1/2 TABLETS BY  MOUTH DAILY, Disp: 135 tablet, Rfl: 1   Medications ordered in this encounter:  Meds ordered this encounter  Medications   predniSONE (DELTASONE) 10 MG tablet    Sig: Directions for 6 day taper: Day 1: 2 tablets before breakfast, 1 after both lunch & dinner and 2 at bedtime Day 2: 1 tab before breakfast, 1 after both lunch & dinner and 2 at bedtime Day 3: 1 tab at each meal & 1 at bedtime Day 4: 1 tab at breakfast, 1 at lunch, 1 at bedtime Day 5: 1 tab at breakfast & 1 tab at bedtime Day 6: 1 tab at breakfast    Dispense:  21 tablet    Refill:  0    Order Specific Question:   Supervising Provider    Answer:   Hyacinth Meeker, BRIAN [[id number]]   azithromycin (ZITHROMAX) 250 MG tablet    Sig: Take 2 tablets on day 1, then 1 tablet daily on days 2 through 5    Dispense:  6 tablet    Refill:  0  Order Specific Question:   Supervising Provider    Answer:   Noemi Chapel [[id number]]   promethazine-dextromethorphan (PROMETHAZINE-DM) 6.25-15 MG/5ML syrup    Sig: Take 5 mLs by mouth 4 (four) times daily as needed for cough.    Dispense:  240 mL    Refill:  0    Order Specific Question:   Supervising Provider    Answer:   Sabra Heck, BRIAN [[id number]]     *If you need refills on other medications prior to your next appointment, please contact your pharmacy*  Follow-Up: Call back or seek an in-person evaluation if the symptoms worsen or if the condition fails to improve as anticipated.  Other Instructions INSTRUCTIONS: use a humidifier for nasal congestion Drink plenty of fluids, rest and wash hands frequently to avoid the spread of infection Alternate tylenol and Motrin for relief of fever    If you have been instructed to have an in-person evaluation today at a  local Urgent Care facility, please use the link below. It will take you to a list of all of our available [facility], including address, phone number and hours of operation. Please do not delay care.  [facility]  If you or a family member do not have a primary care provider, use the link below to schedule a visit and establish care. When you choose a [facility] primary care physician or advanced practice provider, you gain a long-term partner in health. Find a Primary Care Provider  Learn more about [facility] in-office and virtual care options: [facility]

## 2021-07-01 ENCOUNTER — Other Ambulatory Visit: Payer: Self-pay | Admitting: Family Medicine

## 2021-07-01 DIAGNOSIS — F411 Generalized anxiety disorder: Secondary | ICD-10-CM

## 2021-07-02 ENCOUNTER — Ambulatory Visit: Payer: 59 | Admitting: Sports Medicine

## 2021-07-04 ENCOUNTER — Other Ambulatory Visit: Payer: Self-pay | Admitting: Family Medicine

## 2021-07-04 DIAGNOSIS — F411 Generalized anxiety disorder: Secondary | ICD-10-CM

## 2021-07-10 ENCOUNTER — Encounter: Payer: Self-pay | Admitting: Physician Assistant

## 2021-07-10 ENCOUNTER — Telehealth: Payer: 59 | Admitting: Physician Assistant

## 2021-07-10 DIAGNOSIS — J029 Acute pharyngitis, unspecified: Secondary | ICD-10-CM | POA: Diagnosis not present

## 2021-07-10 MED ORDER — NYSTATIN 100000 UNIT/ML MT SUSP: 5.0000 mL | Freq: Three times a day (TID) | OROMUCOSAL | 0 refills | Status: DC | PRN

## 2021-07-10 MED ORDER — AMOXICILLIN 500 MG PO TABS: 500.0000 mg | ORAL_TABLET | Freq: Two times a day (BID) | ORAL | 0 refills | Status: DC

## 2021-07-10 NOTE — Patient Instructions (Signed)
  [employer], thank you for joining Piedad Climes, PA-C for today's virtual visit.  While this provider is not your primary care provider (PCP), if your PCP is located in our provider database this encounter information will be shared with them immediately following your visit.  Consent: (Patient) Maryanne Gehl provided verbal consent for this virtual visit at the beginning of the encounter.  Current Medications:  Current Outpatient Medications:    allopurinol (ZYLOPRIM) 300 MG tablet, Take 1 tablet (300 mg total) by mouth 2 (two) times daily., Disp: 60 tablet, Rfl: 3   Barberry-Oreg Grape-Goldenseal (BERBERINE COMPLEX PO), Take by mouth., Disp: , Rfl:    colchicine 0.6 MG tablet, Take 1 tablet (0.6 mg total) by mouth 2 (two) times daily as needed., Disp: 60 tablet, Rfl: 2   Magnesium 100 MG CAPS, Take 1 capsule by mouth 2 (two) times daily., Disp: , Rfl:    MELATONIN PO, Take by mouth., Disp: , Rfl:    metroNIDAZOLE (METROGEL) 0.75 % gel, Apply 1 application. topically 2 (two) times daily., Disp: 45 g, Rfl: 3   Multiple Vitamin (MULTI-VITAMIN DAILY PO), Take by mouth., Disp: , Rfl:    naproxen sodium (ALEVE) 220 MG tablet, Take 220 mg by mouth 2 (two) times daily as needed., Disp: , Rfl:    Semaglutide,0.25 or 0.5MG /DOS, 2 MG/3ML SOPN, Inject 0.25 mg into the skin once a week., Disp: 3 mL, Rfl: 0   sertraline (ZOLOFT) 50 MG tablet, TAKE 1 AND 1/2 TABLETS BY  MOUTH DAILY, Disp: 135 tablet, Rfl: 0   Medications ordered in this encounter:  No orders of the defined types were placed in this encounter.    *If you need refills on other medications prior to your next appointment, please contact your pharmacy*  Follow-Up: Call back or seek an in-person evaluation if the symptoms worsen or if the condition fails to improve as anticipated.  Other Instructions Take antibiotic as directed with food. Use the Magic Mouthwash no more than as directed. Ok to continue alternating tylenol and  ibuprofen. Salt-water gargles and chloraseptic spray may also be beneficial.  If not resolving or any new/worsening symptoms despite treatment, please seek an in-person evaluation ASAP.    If you have been instructed to have an in-person evaluation today at a local Urgent Care facility, please use the link below. It will take you to a list of all of our available [facility], including address, phone number and hours of operation. Please do not delay care.  [facility]  If you or a family member do not have a primary care provider, use the link below to schedule a visit and establish care. When you choose a [facility] primary care physician or advanced practice provider, you gain a long-term partner in health. Find a Primary Care Provider  Learn more about [facility]'s in-office and virtual care options:  - Get Care Now

## 2021-07-10 NOTE — Progress Notes (Signed)
Virtual Visit Consent   Regina Andrews, you are scheduled for a virtual visit with a [facility] provider today. Just as with appointments in the office, your consent must be obtained to participate. Your consent will be active for this visit and any virtual visit you may have with one of our providers in the next 365 days. If you have a MyChart account, a copy of this consent can be sent to you electronically.  As this is a virtual visit, video technology does not allow for your provider to perform a traditional examination. This may limit your provider's ability to fully assess your condition. If your provider identifies any concerns that need to be evaluated in person or the need to arrange testing (such as labs, EKG, etc.), we will make arrangements to do so. Although advances in technology are sophisticated, we cannot ensure that it will always work on either your end or our end. If the connection with a video visit is poor, the visit may have to be switched to a telephone visit. With either a video or telephone visit, we are not always able to ensure that we have a secure connection.  By engaging in this virtual visit, you consent to the provision of healthcare and authorize for your insurance to be billed (if applicable) for the services provided during this visit. Depending on your insurance coverage, you may receive a charge related to this service.  I need to obtain your verbal consent now. Are you willing to proceed with your visit today? Regina Andrews has provided verbal consent on 07/10/2021 for a virtual visit (video or telephone). Piedad Climes, [place]  Date: 07/10/2021 4:29 PM  Virtual Visit via Video Note   I, Piedad Climes, connected with  Regina Andrews  ([id number], 09-08-1985) on 07/10/21 at  4:15 PM EDT by a video-enabled telemedicine application and verified that I am speaking with the correct person using two identifiers.  Location: Patient: Virtual Visit Location  Patient: Home Provider: Virtual Visit Location Provider: Home Office   I discussed the limitations of evaluation and management by telemedicine and the availability of in person appointments. The patient expressed understanding and agreed to proceed.    History of Present Illness: Regina Andrews is a 36 y.o. who identifies as a female who was assigned female at birth, and is being seen today for persistent sore throat after recent treatment for bacterial bronchitis with Azithromycin. She notes her chest congestion and productive cough have now resolved but her sore throat has been persistent and continually worsening over past week. Denies fever, chills. Notes substantial odynophagia. Notes seeing white spots in the back of her throat earlier today. Denies any lesions of anterior mouth. Denies swelling of throat or uvula but notes her neck is very tender with palpable lymph nodes.    HPI: HPI  Problems:  Patient Active Problem List   Diagnosis Date Noted   Rosacea 05/23/2021   Gout of big toe 04/14/2021   ASCUS of cervix with negative high risk HPV 03/18/2021   History of abnormal cervical Pap smear 03/04/2021   IUD check up 10/18/2019   Abnormal uterine bleeding (AUB) 07/02/2019   Pain of left thumb 08/05/2016   Weight gain 08/05/2016   Worsening headaches 09/09/2015   Von Willebrand disease, type IIa (HCC) 07/12/2015   History of dysphagia 06/20/2015   Vasovagal near syncope 06/11/2015   Generalized anxiety disorder 04/16/2015   Anxiety state 04/10/2015   History of PCOS 04/10/2015   History of  goiter 04/10/2015   History of breast augmentation 04/10/2015   History of abdominoplasty 04/10/2015   History of cholecystectomy 04/10/2015   History of tonsillectomy 04/10/2015    Allergies: No Known Allergies Medications:  Current Outpatient Medications:    amoxicillin (AMOXIL) 500 MG tablet, Take 1 tablet (500 mg total) by mouth 2 (two) times daily., Disp: 20 tablet, Rfl: 0   magic  mouthwash (nystatin, lidocaine, diphenhydrAMINE, alum & mag hydroxide) suspension, Swish and spit 5 mLs 3 (three) times daily as needed for up to 5 days for mouth pain., Disp: 180 mL, Rfl: 0   allopurinol (ZYLOPRIM) 300 MG tablet, Take 1 tablet (300 mg total) by mouth 2 (two) times daily., Disp: 60 tablet, Rfl: 3   Barberry-Oreg Grape-Goldenseal (BERBERINE COMPLEX PO), Take by mouth., Disp: , Rfl:    colchicine 0.6 MG tablet, Take 1 tablet (0.6 mg total) by mouth 2 (two) times daily as needed., Disp: 60 tablet, Rfl: 2   Magnesium 100 MG CAPS, Take 1 capsule by mouth 2 (two) times daily., Disp: , Rfl:    MELATONIN PO, Take by mouth., Disp: , Rfl:    metroNIDAZOLE (METROGEL) 0.75 % gel, Apply 1 application. topically 2 (two) times daily., Disp: 45 g, Rfl: 3   Multiple Vitamin (MULTI-VITAMIN DAILY PO), Take by mouth., Disp: , Rfl:    naproxen sodium (ALEVE) 220 MG tablet, Take 220 mg by mouth 2 (two) times daily as needed., Disp: , Rfl:    Semaglutide,0.25 or 0.5MG /DOS, 2 MG/3ML SOPN, Inject 0.25 mg into the skin once a week., Disp: 3 mL, Rfl: 0   sertraline (ZOLOFT) 50 MG tablet, TAKE 1 AND 1/2 TABLETS BY  MOUTH DAILY, Disp: 135 tablet, Rfl: 0  Observations/Objective: Patient is well-developed, well-nourished in no acute distress.  Resting comfortably at home.  Head is normocephalic, atraumatic.  No labored breathing. Speech is clear and coherent with logical content.  Patient is alert and oriented at baseline.  Oropharynx is mildly erythematous without noted edema. There are white patches around where tonsils would be (s/p tonsillectomy). No cobblestoning noted of posterior oropharynx.   Assessment and Plan: 1. Exudative pharyngitis - amoxicillin (AMOXIL) 500 MG tablet; Take 1 tablet (500 mg total) by mouth 2 (two) times daily.  Dispense: 20 tablet; Refill: 0 - magic mouthwash (nystatin, lidocaine, diphenhydrAMINE, alum & mag hydroxide) suspension; Swish and spit 5 mLs 3 (three) times daily as  needed for up to 5 days for mouth pain.  Dispense: 180 mL; Refill: 0  Giving ongoing and progressive throat symptoms, will start Amox. Will also give magic mouthwash to help ease pain and reduce any inflammation. Supportive measures and OTC medications reviewed. Strict in-person precautions reviewed.   Follow Up Instructions: I discussed the assessment and treatment plan with the patient. The patient was provided an opportunity to ask questions and all were answered. The patient agreed with the plan and demonstrated an understanding of the instructions.  A copy of instructions were sent to the patient via MyChart unless otherwise noted below.    The patient was advised to call back or seek an in-person evaluation if the symptoms worsen or if the condition fails to improve as anticipated.  Time:  I spent 10 minutes with the patient via telehealth technology discussing the above problems/concerns.    Piedad Climes, PA-C

## 2021-07-11 MED ORDER — NYSTATIN 100000 UNIT/ML MT SUSP: 5.0000 mL | Freq: Three times a day (TID) | OROMUCOSAL | 0 refills | Status: AC | PRN

## 2021-07-11 NOTE — Addendum Note (Signed)
Addended by: Margaretann Loveless on: 07/11/2021 05:29 PM   Modules accepted: Orders

## 2021-10-15 ENCOUNTER — Other Ambulatory Visit: Payer: Self-pay | Admitting: Medical-Surgical

## 2021-10-15 DIAGNOSIS — F411 Generalized anxiety disorder: Secondary | ICD-10-CM

## 2021-10-16 NOTE — Telephone Encounter (Signed)
Patient needs an appointment for further refills.  Last video visit 05/15/2021  Supposed to follow up 06/26/2021 - nothing scheduled  Last filled 07/02/2021  Sent 90 day supply to mail order. No refills.

## 2021-10-16 NOTE — Telephone Encounter (Signed)
Pt stated that she was not requesting Rx's. She still has two bottles. She will call once she gets home to get to her calendar to schedule an appt. Tvt

## 2021-12-29 ENCOUNTER — Other Ambulatory Visit: Payer: Self-pay | Admitting: Medical-Surgical

## 2021-12-29 DIAGNOSIS — F411 Generalized anxiety disorder: Secondary | ICD-10-CM

## 2021-12-30 NOTE — Telephone Encounter (Signed)
Am I calling the patient to schedule a appointment for further refills?

## 2022-02-09 ENCOUNTER — Other Ambulatory Visit: Payer: Self-pay | Admitting: Medical-Surgical

## 2022-02-09 DIAGNOSIS — F411 Generalized anxiety disorder: Secondary | ICD-10-CM

## 2022-03-17 ENCOUNTER — Encounter: Payer: Self-pay | Admitting: Medical-Surgical

## 2022-03-17 ENCOUNTER — Ambulatory Visit (INDEPENDENT_AMBULATORY_CARE_PROVIDER_SITE_OTHER): Payer: 59 | Admitting: Obstetrics and Gynecology

## 2022-03-17 ENCOUNTER — Other Ambulatory Visit (HOSPITAL_COMMUNITY)
Admission: RE | Admit: 2022-03-17 | Discharge: 2022-03-17 | Disposition: A | Discharge status: Home / Self Care | Payer: 59 | Source: Ambulatory Visit | Attending: Obstetrics and Gynecology | Admitting: Obstetrics and Gynecology

## 2022-03-17 ENCOUNTER — Ambulatory Visit: Payer: 59 | Admitting: Medical-Surgical

## 2022-03-17 ENCOUNTER — Encounter: Payer: Self-pay | Admitting: Obstetrics and Gynecology

## 2022-03-17 VITALS — BP 114/82 | HR 78 | Resp 17 | Ht 70.0 in | Wt 337.0 lb

## 2022-03-17 VITALS — BP 117/78 | HR 76 | Ht 70.0 in | Wt 337.0 lb

## 2022-03-17 DIAGNOSIS — R7303 Prediabetes: Secondary | ICD-10-CM

## 2022-03-17 DIAGNOSIS — J020 Streptococcal pharyngitis: Secondary | ICD-10-CM | POA: Diagnosis not present

## 2022-03-17 DIAGNOSIS — Z01419 Encounter for gynecological examination (general) (routine) without abnormal findings: Secondary | ICD-10-CM | POA: Diagnosis not present

## 2022-03-17 DIAGNOSIS — J029 Acute pharyngitis, unspecified: Secondary | ICD-10-CM | POA: Diagnosis not present

## 2022-03-17 DIAGNOSIS — Z7689 Persons encountering health services in other specified circumstances: Secondary | ICD-10-CM | POA: Diagnosis not present

## 2022-03-17 DIAGNOSIS — R768 Other specified abnormal immunological findings in serum: Secondary | ICD-10-CM

## 2022-03-17 LAB — POCT GLYCOSYLATED HEMOGLOBIN (HGB A1C): Hemoglobin A1C: 5.7 % — AB (ref 4.0–5.6)

## 2022-03-17 LAB — POCT RAPID STREP A (OFFICE): Rapid Strep A Screen: POSITIVE — AB

## 2022-03-17 MED ORDER — OZEMPIC (0.25 OR 0.5 MG/DOSE) 2 MG/3ML ~~LOC~~ SOPN: 0.5000 mg | PEN_INJECTOR | SUBCUTANEOUS | 0 refills | Status: DC

## 2022-03-17 MED ORDER — FLUCONAZOLE 150 MG PO TABS: 150.0000 mg | ORAL_TABLET | Freq: Once | ORAL | 0 refills | Status: AC

## 2022-03-17 MED ORDER — AMOXICILLIN-POT CLAVULANATE 875-125 MG PO TABS: 1.0000 | ORAL_TABLET | Freq: Two times a day (BID) | ORAL | 0 refills | Status: DC

## 2022-03-17 NOTE — Progress Notes (Signed)
Established Patient Office Visit  Subjective   Patient ID: Regina Andrews, female   DOB: 15-Nov-1985 Age: 37 y.o. MRN: [id number]   Chief Complaint  Patient presents with   Follow-up   Sore Throat   Sinus Problem   HPI Pleasant 37 year old female presenting today for the following:  URI symptoms: feels that she has been sick since October without decent relief of symptoms. About 5 days ago, had a worsening of her sinus congestion, cough (productive of thick yellow sputum), ear pain/pressure, and sore throat. Her son recently had Strep throat.   Weight concerns: last year we discussed Ozempic and it was ordered. She was able to get the medication but never started it due to various issues. She has been doing counseling regarding her eating behaviors and has been encouraged to start the Gate. She has completed two weeks of the 0.28m dose. The first week, she spent a lot of time crying since she was unable to eat like usual and food is a source of comfort.  Notes that her second week has been much better and she is tolerating the medication without difficulty.  Interested in increasing the dose to 0.5 mg weekly but would like to stay there for a while.  Her goal of using the medication is to not need it long-term but to support her while she works on eating behaviors in her relationships with food.  Not currently exercising but has plans to get started once she recovers from her current illness.   Objective:    Vitals:   03/17/22 1031 03/17/22 1032  BP: 114/82   Pulse: 78 78  Resp: 17   Height: 5' 10"$  (1.778 m)   Weight: (!) 337 lb 0.6 oz (152.9 kg)   SpO2: 100% 100%  BMI (Calculated): 48.36    Physical Exam Vitals reviewed.  Constitutional:      General: She is not in acute distress.    Appearance: Normal appearance. She is well-developed. She is obese. She is not ill-appearing.  HENT:     Head: Normocephalic and atraumatic.     Right Ear: Tympanic membrane and ear canal  normal. No middle ear effusion. Tympanic membrane is not erythematous.     Left Ear: Tympanic membrane and ear canal normal.  No middle ear effusion. Tympanic membrane is not erythematous.     Nose: Congestion present. No rhinorrhea.     Mouth/Throat:     Mouth: Mucous membranes are moist.     Pharynx: Uvula midline. Posterior oropharyngeal erythema present. No oropharyngeal exudate.     Tonsils: No tonsillar exudate.  Eyes:     Extraocular Movements:     Right eye: Normal extraocular motion.     Left eye: Normal extraocular motion.     Conjunctiva/sclera: Conjunctivae normal.     Pupils: Pupils are equal, round, and reactive to light.  Cardiovascular:     Rate and Rhythm: Normal rate and regular rhythm.     Pulses: Normal pulses.     Heart sounds: Normal heart sounds.  Pulmonary:     Effort: Pulmonary effort is normal. No respiratory distress.     Breath sounds: Normal breath sounds. No wheezing, rhonchi or rales.  Musculoskeletal:     Cervical back: Normal range of motion.  Lymphadenopathy:     Cervical: No cervical adenopathy.  Skin:    General: Skin is warm and dry.  Neurological:     Mental Status: She is alert and oriented to person, place, and time.  Psychiatric:        Mood and Affect: Mood normal.        Behavior: Behavior normal.        Thought Content: Thought content normal.        Judgment: Judgment normal.    Results for orders placed or performed in visit on 03/17/22 (from the past 24 hour(s))  POCT rapid strep A     Status: Abnormal   Collection Time: 03/17/22 10:39 AM  Result Value Ref Range   Rapid Strep A Screen Positive (A) Negative  POCT HgB A1C     Status: Abnormal   Collection Time: 03/17/22 11:33 AM  Result Value Ref Range   Hemoglobin A1C 5.7 (A) 4.0 - 5.6 %   HbA1c POC (<> result, manual entry)     HbA1c, POC (prediabetic range)     HbA1c, POC (controlled diabetic range)        The ASCVD Risk score (Arnett DK, et al., 2019) failed to  calculate for the following reasons:   The 2019 ASCVD risk score is only valid for ages 20 to 36   Assessment & Plan:   1. Sore throat POCT strep positive. - POCT rapid strep A  2. Strep pharyngitis Treating with Augmentin twice daily x 10 days.  This will address strep pharyngitis and any possible sinobronchitis that may have set in.  Adding Diflucan for vulvovaginal candidiasis prophylaxis.  3. Encounter for weight management Reviewed the use of Ozempic.  Feel that she will benefit greatly from using this medication.  Discussed dosing recommendations.  After completing her next 2 injections of 0.25 mg, increase Ozempic to 0.5 mg weekly.  We will remain on this dose for at least 3 months and discuss dose changes when she returns at that time.  Strongly recommend working on dietary modification as well as exercise habits.  Continue counseling as instructed.  4. Prediabetes POCT hemoglobin A1c 5.7% today.  Continue Ozempic as above. - POCT HgB A1C   Return in about 3 months (around 06/15/2022) for weight check and fasting labs.  ___________________________________________ Clearnce Sorrel, DNP, APRN, FNP-BC Primary Care and Sports Medicine [place]

## 2022-03-17 NOTE — Progress Notes (Signed)
GYNECOLOGY ANNUAL PREVENTATIVE CARE ENCOUNTER NOTE  History:     Regina Andrews is a 37 y.o. G38P2002 female here for a routine annual gynecologic exam.  Current complaints: weight gain and concerning rash on face. Had + ANA with PCP however says her lupus workup was negative. She had a positive ANA titer and very concerned. She recently started Ozempic for weight loss and is doing well over all. She has f/u with PCP today.    Denies abnormal vaginal bleeding, discharge, pelvic pain, problems with intercourse or other gynecologic concerns.  She would like a 2nd opinion d/t positive ANA. Concerned she has an autoimmune disorder based on recurrent illness, rash on face and chest without relief.   Has heavy periods with abnormal uterine bleeding. Has IUD in place, does not check placement.  Gynecologic History No LMP recorded. (Menstrual status: IUD). Contraception: IUD Last Pap: 2023. Result was abnormal with negative HPV, ASC-US   Obstetric History OB History  Gravida Para Term Preterm AB Living  2 2 2     2  $ SAB IAB Ectopic Multiple Live Births          2    # Outcome Date GA Lbr Len/2nd Weight Sex Delivery Anes PTL Lv  2 Term 10/03/09 [redacted]w[redacted]d  M Vag-Spont   LIV  1 Term             Past Medical History:  Diagnosis Date   Anxiety    Cervical cancer screening 04/10/2015   Hx abn Pap 05/2014    Cervical cancer screening 04/10/2015   per record review CIN1 09/18/12 which was observed, 08/11/12 Pap ASCUS (+)HPV, 07/06/13 Pap NILM, 06/19/14 Pap NILM and Neg HPV    Cervical cancer screening 04/10/2015   per record review CIN1 09/18/12 which was observed, 08/11/12 Pap ASCUS (+)HPV, 07/06/13 Pap NILM, 06/19/14 Pap NILM and Neg HPV    Encounter for supervision of normal pregnancy in multigravida 05/13/2015   [place] [facility] [place] [facility]  [facility] Prenatal Labs Dating  8wk [place] type: O/POS/-- (04/17 1106)  Genetic Screen 1  Screen: NT nml   AFP: Neg     : Antibody:NEG (04/17 1106) Anatomic [place]oreaNml @ 19 wks, limited > rescan in 4-6 wks-full eval of heart unable to obtain> f/u [place]ml and complete except RVOT not visualized Rubella:    Family history of depression 04/10/2015   both parents    Family history of depression 04/10/2015   both parents    Family history of diabetes mellitus in first degree relative 04/10/2015   mother    Family history of heart disease 04/10/2015   Family history of heart disease 04/10/2015   History of abdominoplasty 04/10/2015   History of breast augmentation 04/10/2015   History of cholecystectomy 04/10/2015   History of goiter 04/10/2015   History of PCOS 04/10/2015   History of tonsillectomy 04/10/2015    Past Surgical History:  Procedure Laterality Date   BREAST ENHANCEMENT SURGERY     CHOLECYSTECTOMY     TONSILLECTOMY     TUMMY TUCK      Current Outpatient Medications on File Prior to Visit  Medication Sig Dispense Refill   levonorgestrel (MIRENA) 20 MCG/DAY IUD 1 each by Intrauterine route once.     sertraline (ZOLOFT) 50 MG tablet Take 1 tablet (50 mg total) by mouth daily. TAKE 1 AND 1/2 TABLETS BY  MOUTH  DAILY. NEEDS APPOINTMENT FOR FURTHER REFILLS. 90 tablet 0   amoxicillin-clavulanate (AUGMENTIN) 875-125 MG tablet Take 1 tablet by mouth 2 (two) times daily. 20 tablet 0   fluconazole (DIFLUCAN) 150 MG tablet Take 1 tablet (150 mg total) by mouth once for 1 dose. 1 tablet 0   Semaglutide,0.25 or 0.5MG/DOS, (OZEMPIC, 0.25 OR 0.5 MG/DOSE,) 2 MG/3ML SOPN Inject 0.5 mg into the skin once a week. 9 mL 0   No current facility-administered medications on file prior to visit.    No Known Allergies  Social History:  reports that she has never smoked. She has never used smokeless tobacco. She reports that she does not currently use alcohol. She reports that she does not use drugs.  Family History  Problem Relation Age of Onset   Hypertension Mother    Depression Mother     Diabetes Mother    Kidney disease Mother    Depression Father    Hyperlipidemia Father    Hypertension Father    Cancer Paternal Aunt     The following portions of the patient's history were reviewed and updated as appropriate: allergies, current medications, past family history, past medical history, past social history, past surgical history and problem list.  Review of Systems Pertinent items noted in HPI and remainder of comprehensive ROS otherwise negative.  Physical Exam:  BP 117/78   Pulse 76   Ht 5' 10"$  (1.778 m)   Wt (!) 337 lb (152.9 kg)   BMI 48.35 kg/m  CONSTITUTIONAL: Well-developed, well-nourished female in no acute distress.  HENT:  Normocephalic, atraumatic, External right and left ear normal.  EYES: Conjunctivae and EOM are normal. Pupils are equal, round, and reactive to light. No scleral icterus.  NECK: Normal range of motion, supple, no masses.  Normal thyroid.  SKIN: Skin is warm and dry. No rash noted. Not diaphoretic. No erythema. No pallor. MUSCULOSKELETAL: Normal range of motion. No tenderness.  No cyanosis, clubbing, or edema. NEUROLOGIC: Alert and oriented to person, place, and time. Normal reflexes, muscle tone coordination.  PSYCHIATRIC: Normal mood and affect. Normal behavior. Normal judgment and thought content. CARDIOVASCULAR: Normal heart rate noted, regular rhythm RESPIRATORY: Clear to auscultation bilaterally. Effort and breath sounds normal, no problems with respiration noted. BREASTS: Symmetric in size. No masses, tenderness, skin changes, nipple drainage, or lymphadenopathy bilaterally. Performed in the presence of a chaperone. ABDOMEN: Soft, no distention noted.  No tenderness, rebound or guarding.  PELVIC: Normal appearing external genitalia and urethral meatus; normal appearing vaginal mucosa and cervix.  No abnormal vaginal discharge noted.  Pap smear obtained.  Normal uterine size, no other palpable masses, no uterine or adnexal tenderness.   IUD strings not visualized or palpated. Performed in the presence of a chaperone.   Assessment and Plan:  1. ANA positive  - Ambulatory referral to Rheumatology  2. Well woman exam  - Cytology - PAP( [facility]) - US PELVIC COMPLETE WITH TRANSVAGINAL; Future for IUD confirmation.   Will follow up results of pap smear and manage accordingly.  Glendel Jaggers, [place] for [employer], [place] we

## 2022-03-23 ENCOUNTER — Ambulatory Visit (INDEPENDENT_AMBULATORY_CARE_PROVIDER_SITE_OTHER): Payer: 59

## 2022-03-23 DIAGNOSIS — Z30431 Encounter for routine checking of intrauterine contraceptive device: Secondary | ICD-10-CM | POA: Diagnosis not present

## 2022-03-23 DIAGNOSIS — Z01419 Encounter for gynecological examination (general) (routine) without abnormal findings: Secondary | ICD-10-CM

## 2022-03-23 LAB — CYTOLOGY - PAP
Comment: NEGATIVE
Diagnosis: NEGATIVE
Diagnosis: REACTIVE
High risk HPV: NEGATIVE

## 2022-03-26 ENCOUNTER — Encounter: Payer: Self-pay | Admitting: Medical-Surgical

## 2022-03-31 ENCOUNTER — Telehealth: Payer: Self-pay

## 2022-03-31 NOTE — Telephone Encounter (Addendum)
Initiated Prior authorization [id number] (0.25 or 0.5 MG/DOSE) 2MG /3ML pen-injectors Via: Covermymeds Case/Key:[id number] Status: denied  as of 03/31/22 Reason:The request for coverage for OZEMPIC INJ 2MG /3ML, use as directed (3ml per 28 days), is denied. This decision is based on health plan criteria for OZEMPIC INJ 2MG /3ML. This medicine is covered only if: One of the following: (1) Your doctor submits medical records (for example, chart notes) confirming diagnosis of type 2 diabetes mellitus as evidenced by one of the following laboratory values: (A) A1C greater than or equal to 6.5%. (B) Fasting plasma glucose greater than or equal to 126mg /dL. (C) 2-hour plasma glucose greater than or equal to 200mg /dL during oral glucose tolerance test. (2) If you require ongoing treatment for type 2 diabetes mellitus (that is, diagnosed greater than 2 years ago), your doctor submits medical records (for example, chart notes) confirming diagnosis of type 2 diabetes mellitus. The information provided does not show that you meet the criteria listed above.  Notified Pt via: Mychart

## 2022-04-22 ENCOUNTER — Encounter: Payer: Self-pay | Admitting: Medical-Surgical

## 2022-06-15 ENCOUNTER — Encounter: Payer: Self-pay | Admitting: Medical-Surgical

## 2022-06-15 ENCOUNTER — Ambulatory Visit: Payer: 59 | Admitting: Medical-Surgical

## 2022-06-15 VITALS — BP 126/76 | HR 70 | Resp 20 | Ht 70.0 in | Wt 340.0 lb

## 2022-06-15 DIAGNOSIS — R7301 Impaired fasting glucose: Secondary | ICD-10-CM | POA: Diagnosis not present

## 2022-06-15 DIAGNOSIS — Z20818 Contact with and (suspected) exposure to other bacterial communicable diseases: Secondary | ICD-10-CM | POA: Diagnosis not present

## 2022-06-15 DIAGNOSIS — R635 Abnormal weight gain: Secondary | ICD-10-CM

## 2022-06-15 DIAGNOSIS — F411 Generalized anxiety disorder: Secondary | ICD-10-CM

## 2022-06-15 DIAGNOSIS — Z0000 Encounter for general adult medical examination without abnormal findings: Secondary | ICD-10-CM

## 2022-06-15 DIAGNOSIS — M109 Gout, unspecified: Secondary | ICD-10-CM | POA: Diagnosis not present

## 2022-06-15 LAB — CBC WITH DIFFERENTIAL/PLATELET
Absolute Monocytes: 392 cells/uL (ref 200–950)
Eosinophils Relative: 3.7 %
MCV: 89.4 fL (ref 80.0–100.0)
MPV: 9.8 fL (ref 7.5–12.5)
RDW: 12.7 % (ref 11.0–15.0)
WBC: 7 10*3/uL (ref 3.8–10.8)

## 2022-06-15 LAB — POCT RAPID STREP A (OFFICE): Rapid Strep A Screen: NEGATIVE

## 2022-06-15 MED ORDER — SERTRALINE HCL 50 MG PO TABS
50.0000 mg | ORAL_TABLET | Freq: Every day | ORAL | 0 refills | Status: DC
Start: 2022-06-15 — End: 2023-06-16

## 2022-06-15 MED ORDER — OZEMPIC (0.25 OR 0.5 MG/DOSE) 2 MG/3ML ~~LOC~~ SOPN: 0.5000 mg | PEN_INJECTOR | SUBCUTANEOUS | 0 refills | Status: DC

## 2022-06-15 NOTE — Progress Notes (Signed)
        Established patient visit  History, exam, impression, and plan:  1. Weight gain Pleasant 37 year old female presenting today to follow-up on weight concerns.  Previously started on Ozempic 0.25 mg weekly and has been taking this, tolerating well without side effects.  Has not noticed any weight loss on this dose but when she tried to get the increased dose from the pharmacy, she was told that this was unavailable.  She refilled the 0.25 mg weekly dose and would like to try going up to the 0.5 mg now.  Has been working to make healthier dietary choices and practice portion control.  Activity as tolerated.  Checking TSH today.  Increase Ozempic to 0.5 mg weekly. - TSH  2. Impaired fasting glucose Checking hemoglobin A1c. - Hemoglobin A1c  3. Gout of big toe History of gout with infrequent flares approximately 1-2 times per year.  Recently had an issue with a flare and she took colchicine which resolved it.  Interested in having her uric acid levels checked.  Ordering today. - Uric acid  4. Generalized anxiety disorder Doing well on sertraline 50 mg daily.  Tolerating the medication well without side effects.  This seems to do great for her and keeps her mood stable.  Denies SI/HI.  Continue Zoloft 50 mg daily. - sertraline (ZOLOFT) 50 MG tablet; Take 1 tablet (50 mg total) by mouth daily.  Dispense: 90 tablet; Refill: 0  5. Exposure to Streptococcus infection Exposure to strep A through her children and would like to make sure that she is not a carrier or testing positive.  POCT rapid strep A negative today. - POCT rapid strep A  6. Preventative health care Checking labs as below.  - CBC with Differential/Platelet - COMPLETE METABOLIC PANEL WITH GFR - Lipid panel  Procedures performed this visit: None.  Return in about 3 months (around 09/15/2022) for weight check.  __________________________________ Thayer Ohm, DNP, APRN, FNP-BC Primary Care and Sports Medicine [facility] [place]

## 2022-06-16 ENCOUNTER — Encounter: Payer: Self-pay | Admitting: Medical-Surgical

## 2022-06-16 LAB — COMPLETE METABOLIC PANEL WITH GFR
AG Ratio: 1.6 (calc) (ref 1.0–2.5)
ALT: 14 U/L (ref 6–29)
AST: 13 U/L (ref 10–30)
Albumin: 4.6 g/dL (ref 3.6–5.1)
Alkaline phosphatase (APISO): 71 U/L (ref 31–125)
BUN: 13 mg/dL (ref 7–25)
CO2: 29 mmol/L (ref 20–32)
Calcium: 9.6 mg/dL (ref 8.6–10.2)
Chloride: 99 mmol/L (ref 98–110)
Creat: 0.67 mg/dL (ref 0.50–0.97)
Globulin: 2.9 g/dL (calc) (ref 1.9–3.7)
Glucose, Bld: 98 mg/dL (ref 65–99)
Potassium: 4.3 mmol/L (ref 3.5–5.3)
Sodium: 137 mmol/L (ref 135–146)
Total Bilirubin: 0.6 mg/dL (ref 0.2–1.2)
Total Protein: 7.5 g/dL (ref 6.1–8.1)
eGFR: 114 mL/min/{1.73_m2} (ref >=60)

## 2022-06-16 LAB — CBC WITH DIFFERENTIAL/PLATELET
Basophils Absolute: 84 cells/uL (ref 0–200)
Basophils Relative: 1.2 %
Eosinophils Absolute: 259 cells/uL (ref 15–500)
HCT: 41.3 % (ref 35.0–45.0)
Hemoglobin: 13.7 g/dL (ref 11.7–15.5)
Lymphs Abs: 1911 cells/uL (ref 850–3900)
MCH: 29.7 pg (ref 27.0–33.0)
MCHC: 33.2 g/dL (ref 32.0–36.0)
Monocytes Relative: 5.6 %
Neutro Abs: 4354 cells/uL (ref 1500–7800)
Neutrophils Relative %: 62.2 %
Platelets: 334 10*3/uL (ref 140–400)
RBC: 4.62 10*6/uL (ref 3.80–5.10)
Total Lymphocyte: 27.3 %

## 2022-06-16 LAB — LIPID PANEL
Cholesterol: 190 mg/dL (ref <200)
HDL: 57 mg/dL (ref >=50)
LDL Cholesterol (Calc): 107 mg/dL (calc) — ABNORMAL HIGH
Non-HDL Cholesterol (Calc): 133 mg/dL (calc) — ABNORMAL HIGH (ref <130)
Total CHOL/HDL Ratio: 3.3 (calc) (ref <5.0)
Triglycerides: 147 mg/dL (ref <150)

## 2022-06-16 LAB — HEMOGLOBIN A1C
Hgb A1c MFr Bld: 5.7 % of total Hgb — ABNORMAL HIGH (ref <5.7)
Mean Plasma Glucose: 117 mg/dL
eAG (mmol/L): 6.5 mmol/L

## 2022-06-16 LAB — TSH: TSH: 1.66 mIU/L

## 2022-06-16 LAB — URIC ACID: Uric Acid, Serum: 10.1 mg/dL — ABNORMAL HIGH (ref 2.5–7.0)

## 2022-06-16 MED ORDER — ALLOPURINOL 100 MG PO TABS: 100.0000 mg | ORAL_TABLET | Freq: Every day | ORAL | 6 refills | Status: DC

## 2022-06-16 NOTE — Addendum Note (Signed)
Addended byChristen Butter on: 06/16/2022 07:34 AM   Modules accepted: Orders

## 2022-06-26 ENCOUNTER — Ambulatory Visit (INDEPENDENT_AMBULATORY_CARE_PROVIDER_SITE_OTHER): Payer: No Typology Code available for payment source

## 2022-06-26 ENCOUNTER — Encounter: Payer: Self-pay | Admitting: Internal Medicine

## 2022-06-26 ENCOUNTER — Ambulatory Visit: Payer: No Typology Code available for payment source | Attending: Internal Medicine | Admitting: Internal Medicine

## 2022-06-26 VITALS — BP 130/82 | HR 79 | Resp 12 | Ht 70.75 in | Wt 344.0 lb

## 2022-06-26 DIAGNOSIS — R768 Other specified abnormal immunological findings in serum: Secondary | ICD-10-CM | POA: Diagnosis not present

## 2022-06-26 DIAGNOSIS — M25441 Effusion, right hand: Secondary | ICD-10-CM | POA: Diagnosis not present

## 2022-06-26 DIAGNOSIS — M109 Gout, unspecified: Secondary | ICD-10-CM

## 2022-06-26 NOTE — Progress Notes (Signed)
Office Visit Note  Patient: Regina Andrews             Date of Birth: Mar 15, 1985           MRN: [id number]             PCP: Christen Butter, NP Referring: Duane Lope, NP Visit Date: 06/26/2022   Subjective:  New Patient (Initial Visit) (Patient states her hands, shoulders, knees, hips, and feet hurt. Patient states her hands and feet hurt her the most. Patient states she has an elevated Uric Acid. )   History of Present Illness: Nanami Haegele is a 37 y.o. female here for evaluation of positive ANA associated with chronic joint pain in multiple areas, skin rashes, and chronic fatigue.  Symptoms have been ongoing for years and had lab testing checked out last year due to concern about these.  She has a family history of lupus no personal history of autoimmune disease.  Pain is worse in her hands and feet and she notices some swelling occurring in the distal finger joints.  Not associated with warmth or discoloration.  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  Also has lateral hip pain that is worse on the left side more provoked with movements than with pressure.  Pain does not radiate through the leg. Does not have numbness or weakness. Also developed gout with several episodes of podagra about twice per year over the past few years and now on allopurinol daily.  She initially started on 100 mg and was then increased to 600 mg that was discontinued but restarted with return of hyperuricemia to 10.1 and is now on 300 mg total daily.  These have been distinct episodes severely painful cannot tolerate even light pressure on the affected area and with full resolution.  Has never had similar flares outside of the feet. Previously was prescribed some topical medication for suspected rosacea rash on the face and upper chest.  She never saw significant improvement with treatment.  Does not notice a  specific activities or exposures either worsening or improving skin rash.  Does not get associated scarring lesions or discoloration left behind in the red areas. She experiences canker sores in the mouth these are usually painful sometimes occurring on the lips sometimes on side of tongue or on the roof of the mouth. Denies alopecia, lymphadenopathy, Raynaud's symptoms.  Labs reviewed ANA 1:160 Reflex panel neg RF IgM 7 IgA, IgG neg CCP neg ESR wnl CRP wnl Complement wnl   Activities of Daily Living:  Patient reports morning stiffness for 20 minutes.   Patient Reports nocturnal pain.  Difficulty dressing/grooming: Denies Difficulty climbing stairs: Denies Difficulty getting out of chair: Denies Difficulty using hands for taps, buttons, cutlery, and/or writing: Reports  Review of Systems  Constitutional:  Positive for fatigue.  HENT:  Positive for mouth sores and mouth dryness.   Eyes:  Positive for dryness.  Respiratory:  Positive for shortness of breath.   Cardiovascular:  Positive for chest pain and palpitations.  Gastrointestinal:  Positive for diarrhea. Negative for blood in stool and constipation.  Endocrine: Negative for increased urination.  Genitourinary:  Negative for involuntary urination.  Musculoskeletal:  Positive for joint pain, joint pain, joint swelling, myalgias, morning stiffness, muscle tenderness and myalgias. Negative for gait problem and muscle weakness.  Skin:  Positive for rash, hair loss and sensitivity to sunlight. Negative for color  change.  Allergic/Immunologic: Positive for susceptible to infections.  Neurological:  Positive for dizziness and headaches.  Hematological:  Negative for swollen glands.  Psychiatric/Behavioral:  Positive for depressed mood and sleep disturbance. The patient is nervous/anxious.     PMFS History:  Patient Active Problem List   Diagnosis Date Noted   Finger joint swelling, right 06/26/2022   Positive ANA (antinuclear  antibody) 06/26/2022   Rosacea 05/23/2021   Gout of big toe 04/14/2021   ASCUS of cervix with negative high risk HPV 03/18/2021   History of abnormal cervical Pap smear 03/04/2021   IUD check up 10/18/2019   Abnormal uterine bleeding (AUB) 07/02/2019   Pain of left thumb 08/05/2016   Weight gain 08/05/2016   Worsening headaches 09/09/2015   Von Willebrand disease, type IIa (HCC) 07/12/2015   History of dysphagia 06/20/2015   Vasovagal near syncope 06/11/2015   Generalized anxiety disorder 04/16/2015   Anxiety state 04/10/2015   History of PCOS 04/10/2015   History of goiter 04/10/2015   History of breast augmentation 04/10/2015   History of abdominoplasty 04/10/2015   History of cholecystectomy 04/10/2015   History of tonsillectomy 04/10/2015    Past Medical History:  Diagnosis Date   Anxiety    Cervical cancer screening 04/10/2015   Hx abn Pap 05/2014    Cervical cancer screening 04/10/2015   per record review CIN1 09/18/12 which was observed, 08/11/12 Pap ASCUS (+)HPV, 07/06/13 Pap NILM, 06/19/14 Pap NILM and Neg HPV    Cervical cancer screening 04/10/2015   per record review CIN1 09/18/12 which was observed, 08/11/12 Pap ASCUS (+)HPV, 07/06/13 Pap NILM, 06/19/14 Pap NILM and Neg HPV    Encounter for supervision of normal pregnancy in multigravida 05/13/2015   Moved from [place], [place] > may be going to [place] towards end of pregnancy until baby 6 weeks Clinic  [place] Prenatal Labs Dating  8wk [place] Blood type: O/POS/-- (04/17 1106)  Genetic Screen 1 Screen: NT nml   AFP: Neg     : Antibody:NEG (04/17 1106) Anatomic [place] Nml @ 19 wks, limited > rescan in 4-6 wks-full eval of heart unable to obtain> f/u US nml and complete except RVOT not visualized Rubella:    Family history of depression 04/10/2015   both parents    Family history of depression 04/10/2015   both parents    Family history of diabetes mellitus in first degree relative 04/10/2015   mother    Family history of heart disease  04/10/2015   Family history of heart disease 04/10/2015   History of abdominoplasty 04/10/2015   History of breast augmentation 04/10/2015   History of cholecystectomy 04/10/2015   History of goiter 04/10/2015   History of PCOS 04/10/2015   History of tonsillectomy 04/10/2015    Family History  Problem Relation Age of Onset   Hypertension Mother    Depression Mother    Diabetes Mother    Kidney disease Mother    Depression Father    Hyperlipidemia Father    Hypertension Father    Lupus Maternal Grandfather    Cancer Paternal Aunt    Past Surgical History:  Procedure Laterality Date   BREAST ENHANCEMENT SURGERY     CHOLECYSTECTOMY     TONSILLECTOMY     TUMMY TUCK     Social History   Social History Narrative   Not on file   Immunization History  Administered Date(s) Administered   Tdap 10/02/2015     Objective: Vital Signs: BP (!) 129/91 (BP Location:  Left Arm, Patient Position: Sitting, Cuff Size: Normal)   Pulse 72   Resp 12   Ht 5' 10.75" (1.797 m)   Wt (!) 344 lb (156 kg)   BMI 48.32 kg/m    Physical Exam Constitutional:      Appearance: She is obese.  HENT:     Mouth/Throat:     Mouth: Mucous membranes are moist.     Pharynx: Oropharynx is clear.  Eyes:     Conjunctiva/sclera: Conjunctivae normal.  Cardiovascular:     Rate and Rhythm: Normal rate and regular rhythm.  Pulmonary:     Effort: Pulmonary effort is normal.     Breath sounds: Normal breath sounds.  Lymphadenopathy:     Cervical: No cervical adenopathy.  Skin:    General: Skin is warm and dry.     Findings: Rash present.     Comments: Mildly erythematous rash across face and upper chest, blanching, no papules, few visible telangiectasias Nailfold capillaries appear normal  Neurological:     Mental Status: She is alert.  Psychiatric:        Mood and Affect: Mood normal.      Musculoskeletal Exam:  Shoulders full ROM no tenderness or swelling Elbows full ROM no tenderness or  swelling Wrists full ROM no tenderness or swelling Fingers full ROM bilateral first IP joints with mild swelling present, right second DIP swelling present with tenderness Left hip pain provoked with FADIR movement, no lateral tenderness to palpation Knees full ROM no tenderness or swelling, patellofemoral crepitus on let side Ankles full ROM no tenderness or swelling   Investigation: No additional findings.  Imaging: No results found.  Recent Labs: Lab Results  Component Value Date   WBC 7.0 06/15/2022   HGB 13.7 06/15/2022   PLT 334 06/15/2022   NA 137 06/15/2022   K 4.3 06/15/2022   CL 99 06/15/2022   CO2 29 06/15/2022   GLUCOSE 98 06/15/2022   BUN 13 06/15/2022   CREATININE 0.67 06/15/2022   BILITOT 0.6 06/15/2022   ALKPHOS 78 08/05/2016   AST 13 06/15/2022   ALT 14 06/15/2022   PROT 7.5 06/15/2022   ALBUMIN 4.7 08/05/2016   CALCIUM 9.6 06/15/2022   GFRAA 130 11/06/2019    Speciality Comments: No specialty comments available.  Procedures:  No procedures performed Allergies: Patient has no known allergies.   Assessment / Plan:     Visit Diagnoses: Finger joint swelling, right - Plan: XR Hand 2 View Right, Sedimentation rate, C-reactive protein  Joint swelling present in distal finger joints on both hands the distribution is most consistent with osteoarthritis.  Does not have any overlying erythema and range of motion is well-preserved.  Checking x-ray of that right hand which is more effective for evidence of underlying structural joint changes.  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  Also checking sed rate and CRP for systemic inflammation monitoring.  Gout of big toe - Plan: Uric acid  Appears to be pretty well-controlled no podagra episodes no inflammation in her feet on exam today.  Will recheck the uric acid level since she resumed now on 300 mg once daily if it is at  goal can remain there.  Positive ANA (antinuclear antibody) - Plan: C3 and C4, Chromatin (Nucleosomal) Antibody  Previous positive ANA but with negative reflex antibody panel.  She has joint symptoms in multiple areas but overall not typical for lupus clinical criteria.  Will check serum complements also check chromatin antibody trying to identify or rule out as best possible.  If workup remains negative do not think we need to empirically start any DMARD treatment to continue follow-up with existing team.  Orders: Orders Placed This Encounter  Procedures   XR Hand 2 View Right   Sedimentation rate   C-reactive protein   C3 and C4   Uric acid   Chromatin (Nucleosomal) Antibody   No orders of the defined types were placed in this encounter.   Follow-Up Instructions: No follow-ups on file.   Fuller Plan, MD  Note - This record has been created using [employer].  Chart creation errors have been sought, but may not always  have been located. Such creation errors do not reflect on  the standard of medical care.

## 2022-06-28 LAB — URIC ACID: Uric Acid, Serum: 6.5 mg/dL (ref 2.5–7.0)

## 2022-06-28 LAB — C3 AND C4
C3 Complement: 160 mg/dL (ref 83–193)
C4 Complement: 26 mg/dL (ref 15–57)

## 2022-06-28 LAB — CHROMATIN (NUCLEOSOMAL) ANTIBODY: Chromatin (Nucleosomal) Antibody: <1 AI

## 2022-06-28 LAB — C-REACTIVE PROTEIN: CRP: 17.1 mg/L — ABNORMAL HIGH (ref <8.0)

## 2022-06-28 LAB — SEDIMENTATION RATE: Sed Rate: 19 mm/h (ref 0–20)

## 2022-06-28 NOTE — Progress Notes (Signed)
Her x-rays show a moderate amount of osteoarthritis in the further out finger joints and I think this explains the joint widening and the swelling be noticed at the thumb and index finger.  Her sedimentation rate and complement test are normal.  The chromatin antibody test for the positive ANA was also negative.  Based on this I do not think her results are really indicative for lupus or rheumatoid arthritis.  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  If these get more problematic she could benefit to see a orthopedic hand specialist.  Uric acid of 6.5 this is slightly above normal but her gout seems to be doing well and I do not think needs to change treatment for this.

## 2022-08-04 ENCOUNTER — Ambulatory Visit: Payer: No Typology Code available for payment source | Admitting: Internal Medicine

## 2022-09-15 ENCOUNTER — Ambulatory Visit: Payer: No Typology Code available for payment source | Admitting: Medical-Surgical

## 2022-10-05 ENCOUNTER — Ambulatory Visit
Admission: EM | Admit: 2022-10-05 | Discharge: 2022-10-05 | Disposition: A | Discharge status: Home / Self Care | Payer: 59 | Attending: Family Medicine | Admitting: Family Medicine

## 2022-10-05 ENCOUNTER — Other Ambulatory Visit: Payer: Self-pay

## 2022-10-05 DIAGNOSIS — R1013 Epigastric pain: Secondary | ICD-10-CM

## 2022-10-05 DIAGNOSIS — K2900 Acute gastritis without bleeding: Secondary | ICD-10-CM | POA: Diagnosis not present

## 2022-10-05 MED ORDER — SUCRALFATE 1 G PO TABS: 1.0000 g | ORAL_TABLET | Freq: Three times a day (TID) | ORAL | 0 refills | Status: DC

## 2022-10-05 MED ORDER — LIDOCAINE VISCOUS HCL 2 % MT SOLN
15.0000 mL | Freq: Once | OROMUCOSAL | Status: AC
  Administered 2022-10-05: 15 mL via ORAL

## 2022-10-05 MED ORDER — ALUM & MAG HYDROXIDE-SIMETH 200-200-20 MG/5ML PO SUSP
30.0000 mL | Freq: Once | ORAL | Status: AC
  Administered 2022-10-05: 30 mL via ORAL

## 2022-10-05 MED ORDER — OMEPRAZOLE 40 MG PO CPDR: 40.0000 mg | DELAYED_RELEASE_CAPSULE | Freq: Every day | ORAL | 0 refills | Status: DC

## 2022-10-05 NOTE — ED Triage Notes (Signed)
X 1 day has had burning abdominal pain (epigastric). Has nausea, no vomiting or diarrhea.

## 2022-10-05 NOTE — ED Provider Notes (Signed)
[facility]    CSN: [id number] Arrival date & time: 10/05/22  1610      History   Chief Complaint Chief Complaint  Patient presents with   Abdominal Pain    HPI Regina Andrews is a 37 y.o. female.   HPI  Since yesterday Regina Andrews has had severe abdominal pain.  She points to her epigastrium.  She states this started while she was on the phone with her father.  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  She does not drink alcohol.  She does not take NSAID drugs.  She has never had ulcers or GERD symptoms previously.  She will get brief improvement from antacids and then it comes back again.  She has a decreased appetite.  No nausea vomiting or diarrhea.  Last bowel movement is today.  Past Medical History:  Diagnosis Date   Anxiety    Cervical cancer screening 04/10/2015   Hx abn Pap 05/2014    Cervical cancer screening 04/10/2015   per record review CIN1 09/18/12 which was observed, 08/11/12 Pap ASCUS (+)HPV, 07/06/13 Pap NILM, 06/19/14 Pap NILM and Neg HPV    Cervical cancer screening 04/10/2015   per record review CIN1 09/18/12 which was observed, 08/11/12 Pap ASCUS (+)HPV, 07/06/13 Pap NILM, 06/19/14 Pap NILM and Neg HPV    Encounter for supervision of normal pregnancy in multigravida 05/13/2015   Moved from [place], [place] > may be going to [place] towards end of pregnancy until baby 6 weeks Clinic  [place] Prenatal Labs Dating  8wk [place] Blood type: O/POS/-- (04/17 1106)  Genetic Screen 1 Screen: NT nml   AFP: Neg     : Antibody:NEG (04/17 1106) Anatomic [place] Nml @ 19 wks, limited > rescan in 4-6 wks-full eval of heart unable to obtain> f/u US nml and complete except RVOT not visualized Rubella:    Family history of depression 04/10/2015   both parents    Family history of depression 04/10/2015   both parents    Family history of diabetes mellitus in first degree relative 04/10/2015   mother    Family history of heart disease 04/10/2015   Family history  of heart disease 04/10/2015   History of abdominoplasty 04/10/2015   History of breast augmentation 04/10/2015   History of cholecystectomy 04/10/2015   History of goiter 04/10/2015   History of PCOS 04/10/2015   History of tonsillectomy 04/10/2015    Patient Active Problem List   Diagnosis Date Noted   Finger joint swelling, right 06/26/2022   Positive ANA (antinuclear antibody) 06/26/2022   Rosacea 05/23/2021   Gout of big toe 04/14/2021   ASCUS of cervix with negative high risk HPV 03/18/2021   History of abnormal cervical Pap smear 03/04/2021   IUD check up 10/18/2019   Abnormal uterine bleeding (AUB) 07/02/2019   Pain of left thumb 08/05/2016   Weight gain 08/05/2016   Worsening headaches 09/09/2015   Von Willebrand disease, type IIa (HCC) 07/12/2015   History of dysphagia 06/20/2015   Vasovagal near syncope 06/11/2015   Generalized anxiety disorder 04/16/2015   Anxiety state 04/10/2015   History of PCOS 04/10/2015   History of goiter 04/10/2015   History of breast augmentation 04/10/2015   History of abdominoplasty 04/10/2015   History of cholecystectomy 04/10/2015   History of tonsillectomy 04/10/2015    Past Surgical History:  Procedure Laterality Date   BREAST ENHANCEMENT SURGERY     CHOLECYSTECTOMY     TONSILLECTOMY  TUMMY TUCK      OB History     Gravida  2   Para  2   Term  2   Preterm      AB      Living  2      SAB      IAB      Ectopic      Multiple      Live Births  2            Home Medications    Prior to Admission medications   Medication Sig Start Date End Date Taking? Authorizing Provider  omeprazole (PRILOSEC) 40 MG capsule Take 1 capsule (40 mg total) by mouth daily. 10/05/22  Yes Eustace Moore, MD  sucralfate (CARAFATE) 1 g tablet Take 1 tablet (1 g total) by mouth 4 (four) times daily -  with meals and at bedtime. 10/05/22  Yes Eustace Moore, MD  levonorgestrel (MIRENA) 20 MCG/DAY IUD 1 each by Intrauterine  route once.    [provider]  MELATONIN PO Take by mouth.    [provider]  sertraline (ZOLOFT) 50 MG tablet Take 1 tablet (50 mg total) by mouth daily. 06/15/22   Christen Butter, NP    Family History Family History  Problem Relation Age of Onset   Hypertension Mother    Depression Mother    Diabetes Mother    Kidney disease Mother    Depression Father    Hyperlipidemia Father    Hypertension Father    Lupus Maternal Grandfather    Cancer Paternal Aunt     Social History Social History   Tobacco Use   Smoking status: Never    Passive exposure: Never   Smokeless tobacco: Never  Vaping Use   Vaping status: Never Used  Substance Use Topics   Alcohol use: Not Currently    Alcohol/week: 0.0 standard drinks of alcohol   Drug use: Never     Allergies   Patient has no known allergies.   Review of Systems Review of Systems See HPI  Physical Exam Triage Vital Signs ED Triage Vitals  Encounter Vitals Group     BP 10/05/22 1629 128/85     Systolic BP Percentile --      Diastolic BP Percentile --      Pulse Rate 10/05/22 1629 76     Resp 10/05/22 1629 18     Temp 10/05/22 1629 98.4 F (36.9 C)     Temp Source 10/05/22 1629 Oral     SpO2 10/05/22 1629 98 %     Weight --      Height --      Head Circumference --      Peak Flow --      Pain Score 10/05/22 1632 8     Pain Loc --      Pain Education --      Exclude from Growth Chart --    No data found.  Updated Vital Signs BP 128/85 (BP Location: Right Arm)   Pulse 76   Temp 98.4 F (36.9 C) (Oral)   Resp 18   SpO2 98%       Physical Exam Constitutional:      General: She is not in acute distress.    Appearance: She is well-developed. She is obese. She is ill-appearing.  HENT:     Head: Normocephalic and atraumatic.  Eyes:     Conjunctiva/sclera: Conjunctivae normal.     Pupils: Pupils  are equal, round, and reactive to light.  Cardiovascular:     Rate and Rhythm: Normal rate.   Pulmonary:     Effort: Pulmonary effort is normal. No respiratory distress.  Abdominal:     General: Abdomen is protuberant. Bowel sounds are decreased. There is no distension.     Palpations: Abdomen is soft. There is no hepatomegaly or splenomegaly.     Tenderness: There is abdominal tenderness in the epigastric area. There is no guarding or rebound.  Musculoskeletal:        General: Normal range of motion.     Cervical back: Normal range of motion.  Skin:    General: Skin is warm and dry.  Neurological:     Mental Status: She is alert.      UC Treatments / Results  Labs (all labs ordered are listed, but only abnormal results are displayed) Labs Reviewed - No data to display  EKG   Radiology No results found.  Procedures Procedures (including critical care time)  Medications Ordered in UC Medications  alum & mag hydroxide-simeth (MAALOX/MYLANTA) 200-200-20 MG/5ML suspension 30 mL (30 mLs Oral Given 10/05/22 1658)    And  lidocaine (XYLOCAINE) 2 % viscous mouth solution 15 mL (15 mLs Oral Given 10/05/22 1658)    Initial Impression / Assessment and Plan / UC Course  I have reviewed the triage vital signs and the nursing notes.  Pertinent labs & imaging results that were available during my care of the patient were reviewed by me and considered in my medical decision making (see chart for details).     Patient saw moderate to good improvement from the GI cocktail.  Still has some soreness that persists.  I believe that she has a gastritis.  Needs to follow-up with her PCP. Final Clinical Impressions(s) / UC Diagnoses   Final diagnoses:  Acute gastritis, presence of bleeding unspecified, unspecified gastritis type  Abdominal pain, epigastric     Discharge Instructions      Take omeprazole daily once a day Take the Carafate 4 times a day with meals and bedtime Continue to avoid aspirin, Advil, Aleve type medicines Call your doctor tomorrow to schedule an  appointment for next week You may call here for problems   ED Prescriptions     Medication Sig Dispense Auth. Provider   omeprazole (PRILOSEC) 40 MG capsule Take 1 capsule (40 mg total) by mouth daily. 30 capsule Eustace Moore, MD   sucralfate (CARAFATE) 1 g tablet Take 1 tablet (1 g total) by mouth 4 (four) times daily -  with meals and at bedtime. 120 tablet Eustace Moore, MD      PDMP not reviewed this encounter.   Eustace Moore, MD 10/05/22 [id number]

## 2022-10-05 NOTE — Discharge Instructions (Signed)
Take omeprazole daily once a day Take the Carafate 4 times a day with meals and bedtime Continue to avoid aspirin, Advil, Aleve type medicines Call your doctor tomorrow to schedule an appointment for next week You may call here for problems

## 2022-10-07 ENCOUNTER — Ambulatory Visit (INDEPENDENT_AMBULATORY_CARE_PROVIDER_SITE_OTHER): Payer: 59 | Admitting: Medical-Surgical

## 2022-10-07 ENCOUNTER — Encounter: Payer: Self-pay | Admitting: Medical-Surgical

## 2022-10-07 VITALS — BP 123/82 | HR 76 | Resp 20 | Ht 70.75 in | Wt 338.4 lb

## 2022-10-07 DIAGNOSIS — Z6841 Body Mass Index (BMI) 40.0 and over, adult: Secondary | ICD-10-CM | POA: Diagnosis not present

## 2022-10-07 DIAGNOSIS — F321 Major depressive disorder, single episode, moderate: Secondary | ICD-10-CM | POA: Diagnosis not present

## 2022-10-07 DIAGNOSIS — D68020 Von Willebrand disease, type 2a: Secondary | ICD-10-CM | POA: Diagnosis not present

## 2022-10-07 DIAGNOSIS — E010 Iodine-deficiency related diffuse (endemic) goiter: Secondary | ICD-10-CM

## 2022-10-07 DIAGNOSIS — K2900 Acute gastritis without bleeding: Secondary | ICD-10-CM | POA: Diagnosis not present

## 2022-10-07 DIAGNOSIS — R1084 Generalized abdominal pain: Secondary | ICD-10-CM

## 2022-10-07 NOTE — Progress Notes (Signed)
        Established patient visit  History, exam, impression, and plan:  1. Acute gastritis, presence of bleeding unspecified, unspecified gastritis type Pleasant 37 year old female presenting today for reevaluation of epigastric and generalized abdominal pain.  Notes that she has had issues on and off throughout the years however it is recently gotten to the point of intolerable.  She reports the pain in the epigastric area is burning and spread throughout the abdomen.  She was seen in urgent care where she was given omeprazole and Carafate.  These seem to be helping and her pain level today is a 1 out of 10.  Prior to going to urgent care she tried Tums without relief.  Has had some nausea and near syncope when the pain was severe.  Notes that her bowel movements are regular however they have been softer than usual.  She has had a lot of belching, flatulence, and bloating.  Notes that she does have some stools that are darker than usual but no black tarry stools or bright red blood seen.  Today she has a bit of a headache and is not sure if this is related to the new medications or her typical headache pattern.  Plan to check CBC as she does have von Willebrand's disease to make sure her hemoglobin and hematocrit are stable.  Continue omeprazole and Carafate as prescribed.  Referring to GI due to long history of GI concerns.  May need endoscopy for further evaluation.  We will follow-up in approximately 3-4 weeks to reevaluate her symptoms and the need for continued therapy. - CBC  2. Major depressive disorder, single episode, moderate (HCC) Currently taking sertraline, tolerating well without side effects.  Feels medication is working well for her although she does have some baseline anxiety.  Denies SI/HI.  No change in therapy.  3. BMI 45.0-49.9, adult (HCC) History of elevated BMI and morbid obesity.  She is aware of recommendation for weight loss.  Recommend working on dietary modifications and  getting regular intentional exercise to help with weight loss.  4. Von Willebrand disease, type IIa (HCC) Managed by hematology.  We will rechecking CBC today. - CBC  5. Generalized abdominal pain Notes generalized abdominal pain and a history of fatty liver disease that was never followed up on.  Plan for ultrasound of the abdomen for further characterization and investigation of pain. - US Abdomen Complete; Future  6. Thyromegaly Was noted to have thyromegaly when she went to the rheumatologist.  She has had a recent TSH however no other thyroid labs have been done.  No prior history of thyroid imaging but would like to get this evaluated.  Plan for ultrasound of the thyroid today.  Drawing a full thyroid panel for further evaluation. - US THYROID; Future - TSH+T4F+T3Free+ThyAbs+TPO+VD25  Procedures performed this visit: None.  Return in about 4 weeks (around 11/04/2022) for abdominal pain follow up.  __________________________________ Thayer Ohm, DNP, APRN, FNP-BC Primary Care and Sports Medicine [facility] [place]

## 2022-10-14 ENCOUNTER — Ambulatory Visit (INDEPENDENT_AMBULATORY_CARE_PROVIDER_SITE_OTHER): Payer: 59

## 2022-10-14 DIAGNOSIS — R1084 Generalized abdominal pain: Secondary | ICD-10-CM

## 2022-10-14 DIAGNOSIS — E010 Iodine-deficiency related diffuse (endemic) goiter: Secondary | ICD-10-CM | POA: Diagnosis not present

## 2022-10-16 ENCOUNTER — Encounter: Payer: Self-pay | Admitting: Medical-Surgical

## 2022-10-16 DIAGNOSIS — R1084 Generalized abdominal pain: Secondary | ICD-10-CM

## 2022-10-16 DIAGNOSIS — R161 Splenomegaly, not elsewhere classified: Secondary | ICD-10-CM

## 2022-10-16 LAB — CBC
Hematocrit: 40.8 % (ref 34.0–46.6)
Hemoglobin: 13.2 g/dL (ref 11.1–15.9)
MCH: 30.1 pg (ref 26.6–33.0)
MCHC: 32.4 g/dL (ref 31.5–35.7)
MCV: 93 fL (ref 79–97)
Platelets: 323 x10E3/uL (ref 150–450)
RBC: 4.38 x10E6/uL (ref 3.77–5.28)
RDW: 12.6 % (ref 11.7–15.4)
WBC: 7.2 x10E3/uL (ref 3.4–10.8)

## 2022-10-16 LAB — TSH+T4F+T3FREE+THYABS+TPO+VD25
Free T4: 0.92 ng/dL (ref 0.82–1.77)
T3, Free: 3.1 pg/mL (ref 2.0–4.4)
TSH: 1.68 u[IU]/mL (ref 0.450–4.500)
Thyroglobulin Antibody: <1 IU/mL (ref 0.0–0.9)
Thyroperoxidase Ab SerPl-aCnc: 21 IU/mL (ref 0–34)
Vit D, 25-Hydroxy: 33.6 ng/mL (ref 30.0–100.0)

## 2022-10-23 ENCOUNTER — Inpatient Hospital Stay: Payer: 59 | Attending: Hematology & Oncology

## 2022-10-23 ENCOUNTER — Inpatient Hospital Stay (HOSPITAL_BASED_OUTPATIENT_CLINIC_OR_DEPARTMENT_OTHER): Payer: 59 | Admitting: Oncology

## 2022-10-23 ENCOUNTER — Encounter: Payer: Self-pay | Admitting: Oncology

## 2022-10-23 VITALS — BP 123/103 | HR 77 | Temp 98.4°F | Resp 18 | Ht 71.0 in | Wt 341.1 lb

## 2022-10-23 DIAGNOSIS — D68020 Von Willebrand disease, type 2a: Secondary | ICD-10-CM

## 2022-10-23 DIAGNOSIS — R1013 Epigastric pain: Secondary | ICD-10-CM

## 2022-10-23 DIAGNOSIS — R161 Splenomegaly, not elsewhere classified: Secondary | ICD-10-CM | POA: Insufficient documentation

## 2022-10-23 DIAGNOSIS — K760 Fatty (change of) liver, not elsewhere classified: Secondary | ICD-10-CM | POA: Diagnosis not present

## 2022-10-23 LAB — CMP (CANCER CENTER ONLY)
ALT: 17 U/L (ref 0–44)
AST: 14 U/L — ABNORMAL LOW (ref 15–41)
Albumin: 4.2 g/dL (ref 3.5–5.0)
Alkaline Phosphatase: 74 U/L (ref 38–126)
Anion gap: 10 (ref 5–15)
BUN: 15 mg/dL (ref 6–20)
CO2: 28 mmol/L (ref 22–32)
Calcium: 9.3 mg/dL (ref 8.9–10.3)
Chloride: 101 mmol/L (ref 98–111)
Creatinine: 0.67 mg/dL (ref 0.44–1.00)
GFR, Estimated: >60 mL/min (ref >60)
Glucose, Bld: 118 mg/dL — ABNORMAL HIGH (ref 70–99)
Potassium: 3.6 mmol/L (ref 3.5–5.1)
Sodium: 139 mmol/L (ref 135–145)
Total Bilirubin: 0.4 mg/dL (ref 0.3–1.2)
Total Protein: 7.5 g/dL (ref 6.5–8.1)

## 2022-10-23 LAB — RETIC PANEL
Immature Retic Fract: 11.7 % (ref 2.3–15.9)
RBC.: 4.39 MIL/uL (ref 3.87–5.11)
Retic Count, Absolute: 133.5 10*3/uL (ref 19.0–186.0)
Retic Ct Pct: 3 % (ref 0.4–3.1)
Reticulocyte Hemoglobin: 31.6 pg (ref >27.9)

## 2022-10-23 LAB — CBC WITH DIFFERENTIAL (CANCER CENTER ONLY)
Abs Immature Granulocytes: 0.02 10*3/uL (ref 0.00–0.07)
Basophils Absolute: 0.1 10*3/uL (ref 0.0–0.1)
Basophils Relative: 1 %
Eosinophils Absolute: 0.2 10*3/uL (ref 0.0–0.5)
Eosinophils Relative: 4 %
HCT: 39.5 % (ref 36.0–46.0)
Hemoglobin: 13.1 g/dL (ref 12.0–15.0)
Immature Granulocytes: 0 %
Lymphocytes Relative: 29 %
Lymphs Abs: 1.7 10*3/uL (ref 0.7–4.0)
MCH: 29.8 pg (ref 26.0–34.0)
MCHC: 33.2 g/dL (ref 30.0–36.0)
MCV: 90 fL (ref 80.0–100.0)
Monocytes Absolute: 0.3 10*3/uL (ref 0.1–1.0)
Monocytes Relative: 5 %
Neutro Abs: 3.7 10*3/uL (ref 1.7–7.7)
Neutrophils Relative %: 61 %
Platelet Count: 315 10*3/uL (ref 150–400)
RBC: 4.39 MIL/uL (ref 3.87–5.11)
RDW: 12.4 % (ref 11.5–15.5)
WBC Count: 6 10*3/uL (ref 4.0–10.5)
nRBC: 0 % (ref 0.0–0.2)

## 2022-10-23 LAB — LACTATE DEHYDROGENASE: LDH: 123 U/L (ref 98–192)

## 2022-10-23 LAB — SAVE SMEAR(SSMR), FOR PROVIDER SLIDE REVIEW

## 2022-10-23 NOTE — Progress Notes (Signed)
[facility] [phone]   [phone]) [phone]   CONSULT NOTE  REFERRING PHYSICIAN: Christen Butter, NP  REASON FOR CONSULTATION: Splenomegaly  HPI: Regina Andrews is a 37 y.o. female with a past medical history significant for anxiety, depression, von Willebrand disease type IIa.  She has been referred to hematology for evaluation of splenomegaly.  Abdominal ultrasound from 10/14/2022 reviewed which showed increased hepatic parenchymal echogenicity suggestive of steatosis and splenomegaly.  Her most recent lab work was performed on 10/14/2022 showed a normal CBC.  The patient tells me that she was having abdominal pain particularly in her epigastric area sometimes radiates down to her lower abdomen which is what prompted the abdominal ultrasound that she recently had.  Was also experiencing some nausea with this pain.  She was having some burning with this as well.  He is currently taking omeprazole and sucralfate.  Overall, the pain nausea are better.  She is not having any fevers, chills, night sweats.  She is not having any headaches but reports occasional dizziness.  Denies chest pain shortness of breath.  For some intermittent epistaxis but no other bleeding reported.  States that her stools can be dark at time not black.  She would characterize the color as a dark brown.  The patient tells me that she was diagnosed with von Willebrand's at the age of 44.  This was in [place].  She has required medication for treatment of von Willebrand's when she gave birth.  However, she has had abdominal surgery and dental surgery without any treatment with no major bleeding.  Past Medical History:  Diagnosis Date   Anxiety    Cervical cancer screening 04/10/2015   Hx abn Pap 05/2014    Cervical cancer screening 04/10/2015   per record review CIN1 09/18/12 which was observed, 08/11/12 Pap ASCUS (+)HPV, 07/06/13 Pap NILM, 06/19/14 Pap NILM and Neg HPV    Cervical cancer screening  04/10/2015   per record review CIN1 09/18/12 which was observed, 08/11/12 Pap ASCUS (+)HPV, 07/06/13 Pap NILM, 06/19/14 Pap NILM and Neg HPV    Encounter for supervision of normal pregnancy in multigravida 05/13/2015   Moved from [place], [place] > may be going to [place] towards end of pregnancy until baby 6 weeks Clinic  [place] Prenatal Labs Dating  8wk [place] Blood type: O/POS/-- (04/17 1106)  Genetic Screen 1 Screen: NT nml   AFP: Neg     : Antibody:NEG (04/17 1106) Anatomic [place] Nml @ 19 wks, limited > rescan in 4-6 wks-full eval of heart unable to obtain> f/u US nml and complete except RVOT not visualized Rubella:    Family history of depression 04/10/2015   both parents    Family history of depression 04/10/2015   both parents    Family history of diabetes mellitus in first degree relative 04/10/2015   mother    Family history of heart disease 04/10/2015   Family history of heart disease 04/10/2015   Gout    History of abdominoplasty 04/10/2015   History of breast augmentation 04/10/2015   History of cholecystectomy 04/10/2015   History of goiter 04/10/2015   History of PCOS 04/10/2015   History of tonsillectomy 04/10/2015   Prediabetes    Raynaud disease   :    Past Surgical History:  Procedure Laterality Date   BREAST ENHANCEMENT SURGERY     CHOLECYSTECTOMY     TONSILLECTOMY     TUMMY TUCK    :   CURRENT MEDS: Current Outpatient Medications  Medication Sig Dispense Refill   allopurinol (ZYLOPRIM) 300 MG tablet Take 300 mg by mouth 2 (two) times daily. As needed     colchicine 0.6 MG tablet Take 0.6 mg by mouth 2 (two) times daily. As needed     ketoconazole (NIZORAL) 2 % shampoo Apply 1 Application topically.     levonorgestrel (MIRENA) 20 MCG/DAY IUD 1 each by Intrauterine route once.     MELATONIN PO Take by mouth.     omeprazole (PRILOSEC) 40 MG capsule Take 1 capsule (40 mg total) by mouth daily. 30 capsule 0   sertraline (ZOLOFT) 50 MG tablet Take 1 tablet (50 mg total) by  mouth daily. 90 tablet 0   sucralfate (CARAFATE) 1 g tablet Take 1 tablet (1 g total) by mouth 4 (four) times daily -  with meals and at bedtime. 120 tablet 0   No current facility-administered medications for this visit.    No Known Allergies:   Family History  Problem Relation Age of Onset   Hypertension Mother    Depression Mother    Diabetes Mother    Kidney disease Mother    Depression Father    Hyperlipidemia Father    Hypertension Father    Colon cancer Maternal Uncle    Cancer Maternal Uncle    Cancer Paternal Aunt    Skin cancer Maternal Grandmother    Lupus Maternal Grandfather    Edema Paternal Grandmother    Anemia Paternal Grandmother    Skin cancer Cousin    Cancer Cousin    Lymphoma Neg Hx    Leukemia Neg Hx   :   Social History   Socioeconomic History   Marital status: Married    Spouse name: Not on file   Number of children: 2   Years of education: Not on file   Highest education level: Not on file  Occupational History   Not on file  Tobacco Use   Smoking status: Never    Passive exposure: Never   Smokeless tobacco: Never  Vaping Use   Vaping status: Never Used  Substance and Sexual Activity   Alcohol use: Not Currently    Alcohol/week: 0.0 standard drinks of alcohol   Drug use: Never   Sexual activity: Yes    Birth control/protection: I.U.D.  Other Topics Concern   Not on file  Social History Narrative   Not on file   Social Determinants of Health   Financial Resource Strain: Not on file  Food Insecurity: No Food Insecurity (10/23/2022)   Hunger Vital Sign    Worried About Running Out of Food in the Last Year: Never true    Ran Out of Food in the Last Year: Never true  Transportation Needs: No Transportation Needs (10/23/2022)   PRAPARE - Administrator, Civil Service (Medical): No    Lack of Transportation (Non-Medical): No  Physical Activity: Not on file  Stress: Not on file  Social Connections: Not on file   Intimate Partner Violence: Not At Risk (10/23/2022)   Humiliation, Afraid, Rape, and Kick questionnaire    Fear of Current or Ex-Partner: No    Emotionally Abused: No    Physically Abused: No    Sexually Abused: No  :  REVIEW OF SYSTEMS:   Review of Systems  Constitutional:  Negative for chills, fever and weight loss.  HENT: Negative.    Eyes: Negative.   Respiratory: Negative.    Cardiovascular: Negative.   Gastrointestinal:  Positive for abdominal pain  and diarrhea. Negative for blood in stool.  Genitourinary: Negative.   Musculoskeletal: Negative.   Skin: Negative.   Neurological:        Reports occasional dizziness  Endo/Heme/Allergies: Negative.   Psychiatric/Behavioral: Negative.       PHYSICAL EXAMINATION: Blood pressure (!) 123/103, pulse 77, temperature 98.4 F (36.9 C), temperature source Oral, resp. rate 18, height 5\' 11"  (1.803 m), weight (!) 154.7 kg, SpO2 100%.  Physical Exam Vitals reviewed.  Constitutional:      General: She is not in acute distress. HENT:     Head: Normocephalic and atraumatic.  Eyes:     General: No scleral icterus.    Conjunctiva/sclera: Conjunctivae normal.  Cardiovascular:     Rate and Rhythm: Normal rate and regular rhythm.     Heart sounds: No murmur heard. Pulmonary:     Effort: Pulmonary effort is normal. No respiratory distress.     Breath sounds: Normal breath sounds.  Abdominal:     General: Bowel sounds are normal. There is no distension.     Palpations: Abdomen is soft.  Musculoskeletal:        General: Normal range of motion.     Right lower leg: No edema.     Left lower leg: No edema.  Lymphadenopathy:     Cervical: No cervical adenopathy.     Upper Body:     Right upper body: No supraclavicular or axillary adenopathy.     Left upper body: No supraclavicular or axillary adenopathy.  Skin:    General: Skin is warm and dry.  Neurological:     Mental Status: She is alert and oriented to person, place, and time.     LABS:  Lab Results  Component Value Date   WBC 6.0 10/23/2022   HGB 13.1 10/23/2022   HCT 39.5 10/23/2022   PLT 315 10/23/2022   GLUCOSE 118 (H) 10/23/2022   CHOL 190 06/15/2022   TRIG 147 06/15/2022   HDL 57 06/15/2022   LDLCALC 107 (H) 06/15/2022   ALT 17 10/23/2022   AST 14 (L) 10/23/2022   NA 139 10/23/2022   K 3.6 10/23/2022   CL 101 10/23/2022   CREATININE 0.67 10/23/2022   BUN 15 10/23/2022   CO2 28 10/23/2022   HGBA1C 5.7 (H) 06/15/2022    US THYROID  Result Date: 10/14/2022 CLINICAL DATA:  Palpable abnormality.  Thyromegaly on physical exam EXAM: THYROID ULTRASOUND TECHNIQUE: Ultrasound examination of the thyroid gland and adjacent soft tissues was performed. COMPARISON:  None Available. FINDINGS: Parenchymal Echotexture: Mildly heterogenous Isthmus: 0.6 cm Right lobe: 5.3 x 1.8 x 1.6 cm Left lobe: 5.0 x 1.4 x 1.7 cm _________________________________________________________ Estimated total number of nodules >/= 1 cm: 0 Number of spongiform nodules >/=  2 cm not described below (TR1): 0 Number of mixed cystic and solid nodules >/= 1.5 cm not described below (TR2): 0 _________________________________________________________ Mildly enlarged remark and heterogeneous thyroid gland. No significant hypervascularity. Incidental note is made of a subcentimeter nodule in the right mid gland. This does not meet criteria for further evaluation. Additional imaging of the right submandibular region demonstrates and ovoid hypoechoic soft tissue nodule measuring 1.8 x 1.0 cm. Similar findings are present in the left submandibular region a lymph node measuring up to 0.8 cm in short axis. IMPRESSION: 1. Mildly heterogeneous and enlarged but otherwise unremarkable thyroid gland. 2. No discrete thyroid nodules that would warrant further evaluation. 3. Incidental note is made of prominent and borderline enlarged submandibular lymph nodes bilaterally.  These are almost certainly reactive in nature.  The above is in keeping with the ACR TI-RADS recommendations - J Am Coll Radiol 2017;14:587-595. Electronically Signed   By: Malachy Moan M.D.   On: 10/14/2022 14:54   US Abdomen Complete  Result Date: 10/14/2022 CLINICAL DATA:  Generalized abdominal pain EXAM: ABDOMEN ULTRASOUND COMPLETE COMPARISON:  None Available. FINDINGS: Gallbladder: Surgically absent Common bile duct: Diameter: 9.4 mm Liver: Increased echogenicity. No focal lesion. Portal vein is patent on color Doppler imaging with normal direction of blood flow towards the liver. IVC: No abnormality visualized. Pancreas: Visualized portion unremarkable. Spleen: 14.2 cm, enlarged. Right Kidney: Length: 12.4 cm. Echogenicity within normal limits. No mass or hydronephrosis visualized. Left Kidney: Length: 12.9 cm. Echogenicity within normal limits. No mass or hydronephrosis visualized. Abdominal aorta: No aneurysm visualized. Other findings: None. IMPRESSION: 1. Status post cholecystectomy. 2. Increased hepatic parenchymal echogenicity suggestive of steatosis. 3. Splenomegaly. Electronically Signed   By: Annia Belt M.D.   On: 10/14/2022 10:49    ASSESSMENT: 1.  Splenomegaly 2.  Hepatic steatosis 3.  Epigastric abdominal pain 4.  Von Willebrand disease. type IIa  PLAN:  Abdominal ultrasound results discussed with the patient.  She was noted to have mild splenomegaly on this ultrasound.  We discussed potential causes for splenomegaly including underlying malignancy such as a low proliferative neoplasm or lymphoma, liver disease, autoimmune condition.  She has been seen by rheumatology who evaluated her for possible lupus and rheumatoid arthritis and have ruled these out.  I think an underlying malignancy is less likely given that her CBC is completely normal.  However, will perform additional workup today including peripheral blood smear and LDH.  Further discussed that there are findings of hepatic steatosis on her abdominal ultrasound.  I  would recommend evaluation by GI for underlying liver disease.  She has a pending referral to GI in [place] but has not yet been able to get an appointment.  I have also placed a referral to [facility] to try to expedite workup for her abdominal pain and hepatic steatosis.  She has a history of von Willebrand disease.  I do not have records regarding this.  Von Willebrand panel is pending today.  Will bring her back for follow-up in approximately 2 to 3 weeks to review her results.  Clenton Pare, NP

## 2022-10-26 LAB — VON WILLEBRAND PANEL
Coagulation Factor VIII: 99 % (ref 56–140)
Ristocetin Co-factor, Plasma: 91 % (ref 50–200)
Von Willebrand Antigen, Plasma: 101 % (ref 50–200)

## 2022-10-26 LAB — COAG STUDIES INTERP REPORT

## 2022-10-28 LAB — HGB FRACTIONATION CASCADE
Hgb A2: 2.4 % (ref 1.8–3.2)
Hgb A: 97.6 % (ref 96.4–98.8)
Hgb F: 0 % (ref 0.0–2.0)
Hgb S: 0 %

## 2022-11-05 ENCOUNTER — Ambulatory Visit: Payer: 59 | Admitting: Medical-Surgical

## 2022-11-11 ENCOUNTER — Inpatient Hospital Stay: Payer: 59

## 2022-11-11 ENCOUNTER — Ambulatory Visit: Payer: 59 | Admitting: Hematology & Oncology

## 2022-11-25 ENCOUNTER — Other Ambulatory Visit: Payer: Self-pay

## 2022-11-25 DIAGNOSIS — D68020 Von Willebrand disease, type 2a: Secondary | ICD-10-CM

## 2022-11-26 ENCOUNTER — Encounter: Payer: Self-pay | Admitting: Hematology & Oncology

## 2022-11-26 ENCOUNTER — Inpatient Hospital Stay (HOSPITAL_BASED_OUTPATIENT_CLINIC_OR_DEPARTMENT_OTHER): Payer: 59 | Admitting: Hematology & Oncology

## 2022-11-26 ENCOUNTER — Inpatient Hospital Stay: Payer: 59 | Attending: Hematology & Oncology

## 2022-11-26 VITALS — BP 138/74 | HR 70 | Temp 97.9°F | Resp 20 | Ht 71.0 in | Wt 342.1 lb

## 2022-11-26 DIAGNOSIS — R161 Splenomegaly, not elsewhere classified: Secondary | ICD-10-CM

## 2022-11-26 DIAGNOSIS — D68020 Von Willebrand disease, type 2a: Secondary | ICD-10-CM

## 2022-11-26 DIAGNOSIS — K760 Fatty (change of) liver, not elsewhere classified: Secondary | ICD-10-CM | POA: Insufficient documentation

## 2022-11-26 LAB — CBC WITH DIFFERENTIAL (CANCER CENTER ONLY)
Abs Immature Granulocytes: 0.04 10*3/uL (ref 0.00–0.07)
Basophils Absolute: 0.1 10*3/uL (ref 0.0–0.1)
Basophils Relative: 1 %
Eosinophils Absolute: 0.2 10*3/uL (ref 0.0–0.5)
Eosinophils Relative: 2 %
HCT: 39 % (ref 36.0–46.0)
Hemoglobin: 13 g/dL (ref 12.0–15.0)
Immature Granulocytes: 1 %
Lymphocytes Relative: 24 %
Lymphs Abs: 2.2 10*3/uL (ref 0.7–4.0)
MCH: 29.4 pg (ref 26.0–34.0)
MCHC: 33.3 g/dL (ref 30.0–36.0)
MCV: 88.2 fL (ref 80.0–100.0)
Monocytes Absolute: 0.5 10*3/uL (ref 0.1–1.0)
Monocytes Relative: 5 %
Neutro Abs: 5.9 10*3/uL (ref 1.7–7.7)
Neutrophils Relative %: 67 %
Platelet Count: 342 10*3/uL (ref 150–400)
RBC: 4.42 MIL/uL (ref 3.87–5.11)
RDW: 12.4 % (ref 11.5–15.5)
WBC Count: 8.8 10*3/uL (ref 4.0–10.5)
nRBC: 0 % (ref 0.0–0.2)

## 2022-11-26 LAB — APTT: aPTT: 34 s (ref 24–36)

## 2022-11-26 NOTE — Progress Notes (Signed)
Hematology and Oncology Follow Up Visit  Regina Andrews [id number] 1985/10/22 37 y.o. 11/26/2022   Principle Diagnosis:  Splenomegaly --mild  Current Therapy:   Observation     Interim History:  Ms. Regina Andrews is back for follow-up.  She is a very charming 37 year old white female.  She is originally from [place].  She and her husband and family have been in [place] for about 8 years.  Her husband actually works for [employer] and is involved with [employer].  I think this is incredibly interesting.  She has been found to have mild splenomegaly.  She saw Belenda Cruise back in September.  At that time, she had an ultrasound that was done which showed a spleen that was 14.2 cm in size.  She has tested positive for lupus at least by serological studies.  I am not sure exactly what this means as she actually does have lupus..  She said that she has von Willebrand's disease.  She says she was told she had this at age 35.  She is mostly concerned about her weight.  She says that her weight has gone up 100 pounds since I think she has moved to [place].  All of her family is back in [place].  She does have some hepatic steatosis.  She has not yet been able to see Gastroenterology for this.  She has had no abdominal pain.  There has been no problems with bowel or bladder habits.  She does have her monthly cycles.  She says that she does have some bleeding after having an IUD placed.  She has had no fever.  She has had no issues with COVID.[name]  There is been no rashes.  She has had no swollen lymph nodes..  Overall, I would say her performance status is probably ECOG 0.  Medications:  Current Outpatient Medications:    levonorgestrel (MIRENA) 20 MCG/DAY IUD, 1 each by Intrauterine route once., Disp: , Rfl:    MELATONIN PO, Take 2.5 mg by mouth at bedtime., Disp: , Rfl:    sertraline (ZOLOFT) 50 MG tablet, Take 1 tablet (50 mg total) by mouth daily., Disp: 90 tablet, Rfl: 0   allopurinol (ZYLOPRIM)  300 MG tablet, Take 300 mg by mouth 2 (two) times daily. As needed (Patient not taking: Reported on 11/26/2022), Disp: , Rfl:    colchicine 0.6 MG tablet, Take 0.6 mg by mouth 2 (two) times daily. As needed, Disp: , Rfl:    ketoconazole (NIZORAL) 2 % shampoo, Apply 1 Application topically. (Patient not taking: Reported on 11/26/2022), Disp: , Rfl:    omeprazole (PRILOSEC) 40 MG capsule, Take 1 capsule (40 mg total) by mouth daily. (Patient not taking: Reported on 11/26/2022), Disp: 30 capsule, Rfl: 0   sucralfate (CARAFATE) 1 g tablet, Take 1 tablet (1 g total) by mouth 4 (four) times daily -  with meals and at bedtime. (Patient not taking: Reported on 11/26/2022), Disp: 120 tablet, Rfl: 0  Allergies: No Known Allergies  Past Medical History, Surgical history, Social history, and Family History were reviewed and updated.  Review of Systems: Review of Systems  Constitutional: Negative.   HENT:  Negative.    Eyes: Negative.   Respiratory: Negative.    Cardiovascular: Negative.   Gastrointestinal: Negative.   Endocrine: Negative.   Genitourinary: Negative.    Musculoskeletal: Negative.   Skin: Negative.   Neurological: Negative.   Hematological: Negative.   Psychiatric/Behavioral: Negative.      Physical Exam:  height is 5\' 11"  (1.803  m) and weight is 342 lb 1.9 oz (155.2 kg) (abnormal). Her oral temperature is 97.9 F (36.6 C). Her blood pressure is 138/74 and her pulse is 70. Her respiration is 20 and oxygen saturation is 99%.   Wt Readings from Last 3 Encounters:  11/26/22 (!) 342 lb 1.9 oz (155.2 kg)  10/23/22 (!) 341 lb 1.3 oz (154.7 kg)  10/07/22 (!) 338 lb 6.4 oz (153.5 kg)    Physical Exam Vitals reviewed.  HENT:     Head: Normocephalic and atraumatic.  Eyes:     Pupils: Pupils are equal, round, and reactive to light.  Cardiovascular:     Rate and Rhythm: Normal rate and regular rhythm.     Heart sounds: Normal heart sounds.  Pulmonary:     Effort: Pulmonary effort  is normal.     Breath sounds: Normal breath sounds.  Abdominal:     General: Bowel sounds are normal.     Palpations: Abdomen is soft.     Comments: Abdominal exam is soft but obese.  She has decent bowel sounds.  There is no guarding or rebound tenderness.  There is no fluid wave.  There is no abdominal masses.  There is no palpable liver or spleen tip.  Musculoskeletal:        General: No tenderness or deformity. Normal range of motion.     Cervical back: Normal range of motion.  Lymphadenopathy:     Cervical: No cervical adenopathy.  Skin:    General: Skin is warm and dry.     Findings: No erythema or rash.  Neurological:     Mental Status: She is alert and oriented to person, place, and time.  Psychiatric:        Behavior: Behavior normal.        Thought Content: Thought content normal.        Judgment: Judgment normal.      Lab Results  Component Value Date   WBC 8.8 11/26/2022   HGB 13.0 11/26/2022   HCT 39.0 11/26/2022   MCV 88.2 11/26/2022   PLT 342 11/26/2022     Chemistry      Component Value Date/Time   NA 139 10/23/2022 1034   K 3.6 10/23/2022 1034   CL 101 10/23/2022 1034   CO2 28 10/23/2022 1034   BUN 15 10/23/2022 1034   CREATININE 0.67 10/23/2022 1034   CREATININE 0.67 06/15/2022 1040      Component Value Date/Time   CALCIUM 9.3 10/23/2022 1034   ALKPHOS 74 10/23/2022 1034   AST 14 (L) 10/23/2022 1034   ALT 17 10/23/2022 1034   BILITOT 0.4 10/23/2022 1034       Impression and Plan: Ms. Regina Andrews is a very charming 37 year old white female.  I really had a visit fantastic time talking with her.  She is incredibly eloquent.  She is incredibly interesting.  I cannot imagine that this splenomegaly is going to be a reflection of any hematologic malignancy.  Her blood counts certainly look fine.  She is not thrombocytopenic.  She is not neutropenic/leukopenic.  She has a good white cell differential.  For right now, I think we can just watch her.  I  think would be nice to get another ultrasound of her spleen to see if there is any change in the spleen size.  We will see about getting this in December for her.  I told her that the ultimate diagnostic test would be a bone marrow biopsy.  However, I  would think that this would be the very last test I would consider doing in this situation.  Again, I just do not see that there is a likely chance of her having any type of hematologic malignancy.  I will also had to check out this von Willebrand issue.  We may have to do a little bit of a workup to more see her back.  We will plan to see her back in January 2025.   Josph Macho, MD 10/31/20243:57 PM

## 2023-01-04 ENCOUNTER — Telehealth (HOSPITAL_BASED_OUTPATIENT_CLINIC_OR_DEPARTMENT_OTHER): Payer: Self-pay | Admitting: Hematology & Oncology

## 2023-01-11 ENCOUNTER — Ambulatory Visit: Payer: 59

## 2023-01-11 DIAGNOSIS — R161 Splenomegaly, not elsewhere classified: Secondary | ICD-10-CM

## 2023-03-17 ENCOUNTER — Telehealth: Payer: Self-pay | Admitting: *Deleted

## 2023-03-17 NOTE — Telephone Encounter (Signed)
Left patient a message to call and schedule annual. 

## 2023-04-20 ENCOUNTER — Telehealth: Payer: Self-pay | Admitting: *Deleted

## 2023-04-20 NOTE — Telephone Encounter (Signed)
 Left patient a message to call and reschedule appointment for 04/30/23 due to provider schedule change.

## 2023-04-30 ENCOUNTER — Ambulatory Visit: Payer: 59 | Admitting: Obstetrics and Gynecology

## 2023-05-13 ENCOUNTER — Ambulatory Visit
Admission: EM | Admit: 2023-05-13 | Discharge: 2023-05-13 | Disposition: A | Discharge status: Home / Self Care | Attending: Family Medicine | Admitting: Family Medicine

## 2023-05-13 ENCOUNTER — Other Ambulatory Visit: Payer: Self-pay

## 2023-05-13 DIAGNOSIS — J0100 Acute maxillary sinusitis, unspecified: Secondary | ICD-10-CM | POA: Diagnosis not present

## 2023-05-13 MED ORDER — AMOXICILLIN-POT CLAVULANATE 875-125 MG PO TABS: 1.0000 | ORAL_TABLET | Freq: Two times a day (BID) | ORAL | 0 refills | Status: DC

## 2023-05-13 NOTE — ED Provider Notes (Signed)
 [facility]    CSN: [id number] Arrival date & time: 05/13/23  1020      History   Chief Complaint Chief Complaint  Patient presents with   Sore Throat    HPI Regina Andrews is a 38 y.o. female.   Patient has had sinus symptoms for just under 1 week.  Pressure and pain at her sinuses, headache.  Thick yellow drainage.  Sore throat.  Some coughing.  Her daughter is also here with her.  She has had sinus symptoms going on for a week and a half.  They both appear to have the same infection.  Patient states she has had sinus infections in the past.    Past Medical History:  Diagnosis Date   Anxiety    Cervical cancer screening 04/10/2015   Hx abn Pap 05/2014    Cervical cancer screening 04/10/2015   per record review CIN1 09/18/12 which was observed, 08/11/12 Pap ASCUS (+)HPV, 07/06/13 Pap NILM, 06/19/14 Pap NILM and Neg HPV    Cervical cancer screening 04/10/2015   per record review CIN1 09/18/12 which was observed, 08/11/12 Pap ASCUS (+)HPV, 07/06/13 Pap NILM, 06/19/14 Pap NILM and Neg HPV    Encounter for supervision of normal pregnancy in multigravida 05/13/2015   Moved from [place], [place] > may be going to [place] towards end of pregnancy until baby 6 weeks Clinic  [place] Prenatal Labs Dating  8wk US  Blood type: O/POS/-- (04/17 1106)  Genetic Screen 1 Screen: NT nml   AFP: Neg     : Antibody:NEG (04/17 1106) Anatomic US  Nml @ 19 wks, limited > rescan in 4-6 wks-full eval of heart unable to obtain> f/u US  nml and complete except RVOT not visualized Rubella:    Family history of depression 04/10/2015   both parents    Family history of depression 04/10/2015   both parents    Family history of diabetes mellitus in first degree relative 04/10/2015   mother    Family history of heart disease 04/10/2015   Family history of heart disease 04/10/2015   Gout    History of abdominoplasty 04/10/2015   History of breast augmentation 04/10/2015   History of cholecystectomy 04/10/2015    History of goiter 04/10/2015   History of PCOS 04/10/2015   History of tonsillectomy 04/10/2015   Prediabetes    Raynaud disease     Patient Active Problem List   Diagnosis Date Noted   Major depressive disorder, single episode, moderate (HCC) 10/07/2022   BMI 45.0-49.9, adult (HCC) 10/07/2022   Finger joint swelling, right 06/26/2022   Positive ANA (antinuclear antibody) 06/26/2022   Rosacea 05/23/2021   Gout of big toe 04/14/2021   ASCUS of cervix with negative high risk HPV 03/18/2021   History of abnormal cervical Pap smear 03/04/2021   IUD check up 10/18/2019   Abnormal uterine bleeding (AUB) 07/02/2019   Pain of left thumb 08/05/2016   Weight gain 08/05/2016   Worsening headaches 09/09/2015   Von Willebrand disease, type IIa (HCC) 07/12/2015   History of dysphagia 06/20/2015   Vasovagal near syncope 06/11/2015   Generalized anxiety disorder 04/16/2015   Anxiety state 04/10/2015   History of PCOS 04/10/2015   History of goiter 04/10/2015   History of breast augmentation 04/10/2015   History of abdominoplasty 04/10/2015   History of cholecystectomy 04/10/2015   History of tonsillectomy 04/10/2015    Past Surgical History:  Procedure Laterality Date   BREAST ENHANCEMENT SURGERY     CHOLECYSTECTOMY  TONSILLECTOMY     TUMMY TUCK      OB History     Gravida  2   Para  2   Term  2   Preterm      AB      Living  2      SAB      IAB      Ectopic      Multiple      Live Births  2            Home Medications    Prior to Admission medications   Medication Sig Start Date End Date Taking? Authorizing Provider  amoxicillin-clavulanate (AUGMENTIN) 875-125 MG tablet Take 1 tablet by mouth every 12 (twelve) hours. 05/13/23  Yes Eustace Moore, MD  levonorgestrel (MIRENA) 20 MCG/DAY IUD 1 each by Intrauterine route once.    [provider]  MELATONIN PO Take 2.5 mg by mouth at bedtime.    [provider]  sertraline  (ZOLOFT) 50 MG tablet Take 1 tablet (50 mg total) by mouth daily. 06/15/22   Christen Butter, NP    Family History Family History  Problem Relation Age of Onset   Hypertension Mother    Depression Mother    Diabetes Mother    Kidney disease Mother    Depression Father    Hyperlipidemia Father    Hypertension Father    Colon cancer Maternal Uncle    Cancer Maternal Uncle    Cancer Paternal Aunt    Skin cancer Maternal Grandmother    Lupus Maternal Grandfather    Edema Paternal Grandmother    Anemia Paternal Grandmother    Skin cancer Cousin    Cancer Cousin    Lymphoma Neg Hx    Leukemia Neg Hx     Social History Social History   Tobacco Use   Smoking status: Never    Passive exposure: Never   Smokeless tobacco: Never  Vaping Use   Vaping status: Never Used  Substance Use Topics   Alcohol use: Not Currently    Alcohol/week: 0.0 standard drinks of alcohol   Drug use: Never     Allergies   Patient has no known allergies.   Review of Systems Review of Systems See HPI  Physical Exam Triage Vital Signs ED Triage Vitals  Encounter Vitals Group     BP 05/13/23 1040 127/88     Systolic BP Percentile --      Diastolic BP Percentile --      Pulse Rate 05/13/23 1040 77     Resp 05/13/23 1040 16     Temp 05/13/23 1040 97.9 F (36.6 C)     Temp src --      SpO2 05/13/23 1040 96 %     Weight --      Height --      Head Circumference --      Peak Flow --      Pain Score 05/13/23 1043 5     Pain Loc --      Pain Education --      Exclude from Growth Chart --    No data found.  Updated Vital Signs BP 127/88   Pulse 77   Temp 97.9 F (36.6 C)   Resp 16   SpO2 96%      Physical Exam Constitutional:      General: She is not in acute distress.    Appearance: She is well-developed.     Comments: Pleasant.  Overweight.  Mild ill appearance  HENT:     Head: Normocephalic and atraumatic.     Ears:     Comments: Right TM has a cluster of yellow bumps that  may be a cholesteatoma.  There is some dull appearance to the eardrum.    Nose: Congestion and rhinorrhea present.     Mouth/Throat:     Mouth: Mucous membranes are moist.     Pharynx: Posterior oropharyngeal erythema present.     Tonsils: No tonsillar exudate. 0 on the right. 0 on the left.  Eyes:     Conjunctiva/sclera: Conjunctivae normal.     Pupils: Pupils are equal, round, and reactive to light.  Cardiovascular:     Rate and Rhythm: Normal rate and regular rhythm.     Heart sounds: Normal heart sounds.  Pulmonary:     Effort: Pulmonary effort is normal. No respiratory distress.     Breath sounds: Normal breath sounds.  Abdominal:     General: There is no distension.     Palpations: Abdomen is soft.  Musculoskeletal:        General: Normal range of motion.     Cervical back: Normal range of motion.  Lymphadenopathy:     Cervical: Cervical adenopathy present.  Skin:    General: Skin is warm and dry.  Neurological:     Mental Status: She is alert.      UC Treatments / Results  Labs (all labs ordered are listed, but only abnormal results are displayed) Labs Reviewed - No data to display  EKG   Radiology No results found.  Procedures Procedures (including critical care time)  Medications Ordered in UC Medications - No data to display  Initial Impression / Assessment and Plan / UC Course  I have reviewed the triage vital signs and the nursing notes.  Pertinent labs & imaging results that were available during my care of the patient were reviewed by me and considered in my medical decision making (see chart for details).     Patient advised to mention her your down to her PCP at her next visit Patient has underlying allergies with new symptoms suspicious for sinusitis.  Final Clinical Impressions(s) / UC Diagnoses   Final diagnoses:  Acute maxillary sinusitis, recurrence not specified     Discharge Instructions      Take Augmentin 2 times a  day Consider a probiotic while taking Augmentin Drink lots of fluids May take over-the-counter cough or cold medicines as needed See your doctor if not improving by next week   ED Prescriptions     Medication Sig Dispense Auth. Provider   amoxicillin-clavulanate (AUGMENTIN) 875-125 MG tablet Take 1 tablet by mouth every 12 (twelve) hours. 14 tablet Stephany Ehrich, MD      PDMP not reviewed this encounter.   Stephany Ehrich, MD 05/13/23 [phone]

## 2023-05-13 NOTE — ED Triage Notes (Signed)
 Since Sunday has been ill, has c/o body aches, headache, congestion, ears feel like fluid in them, stomach ache "think it's from drainage", sneezing, coughing. No fever. Yesterday took mucinex dm.

## 2023-05-13 NOTE — Discharge Instructions (Addendum)
 Take Augmentin 2 times a day Consider a probiotic while taking Augmentin Drink lots of fluids May take over-the-counter cough or cold medicines as needed See your doctor if not improving by next week

## 2023-05-25 ENCOUNTER — Encounter: Payer: Self-pay | Admitting: Obstetrics and Gynecology

## 2023-05-25 ENCOUNTER — Ambulatory Visit (INDEPENDENT_AMBULATORY_CARE_PROVIDER_SITE_OTHER): Admitting: Obstetrics and Gynecology

## 2023-05-25 VITALS — BP 129/84 | HR 80 | Ht 70.5 in | Wt 345.0 lb

## 2023-05-25 DIAGNOSIS — Z01419 Encounter for gynecological examination (general) (routine) without abnormal findings: Secondary | ICD-10-CM

## 2023-05-25 DIAGNOSIS — E282 Polycystic ovarian syndrome: Secondary | ICD-10-CM | POA: Diagnosis not present

## 2023-05-25 MED ORDER — METFORMIN HCL 500 MG PO TABS: 500.0000 mg | ORAL_TABLET | Freq: Two times a day (BID) | ORAL | 1 refills | Status: AC

## 2023-05-25 NOTE — Progress Notes (Signed)
 GYNECOLOGY ANNUAL PREVENTATIVE CARE ENCOUNTER NOTE  History:     Regina Andrews is a 38 y.o. G35P2002 female here for a routine annual gynecologic exam.  Current complaints: irregular bleeding with IUD in place.   Denies abnormal vaginal bleeding, discharge, pelvic pain, problems with intercourse or other gynecologic concerns.    Gynecologic History No LMP recorded. (Menstrual status: IUD). No periods with IUD, irregular bleeding with IUD. Had 3 days of bleeding total this month and felt more like a period.  Not sexually active x 1 year  Fibroid noted on pelvic US  < 1 cm Contraception: IUD placed in 2021 Last Pap: 2023. Result was normal with negative HPV Last Mammogram: NA.   Last Colonoscopy:NA  Obstetric History OB History  Gravida Para Term Preterm AB Living  2 2 2   2   SAB IAB Ectopic Multiple Live Births      2    # Outcome Date GA Lbr Len/2nd Weight Sex Type Anes PTL Lv  2 Term 10/03/09 [redacted]w[redacted]d   M Vag-Spont   LIV  1 Term             Past Medical History:  Diagnosis Date   Anxiety    Cervical cancer screening 04/10/2015   Hx abn Pap 05/2014    Cervical cancer screening 04/10/2015   per record review CIN1 09/18/12 which was observed, 08/11/12 Pap ASCUS (+)HPV, 07/06/13 Pap NILM, 06/19/14 Pap NILM and Neg HPV    Cervical cancer screening 04/10/2015   per record review CIN1 09/18/12 which was observed, 08/11/12 Pap ASCUS (+)HPV, 07/06/13 Pap NILM, 06/19/14 Pap NILM and Neg HPV    Encounter for supervision of normal pregnancy in multigravida 05/13/2015   Moved from [place], [place] > may be going to [place] towards end of pregnancy until baby 6 weeks Clinic  [facility] Prenatal Labs Dating  8wk US  Blood type: O/POS/-- (04/17 1106)  Genetic Screen 1 Screen: NT nml   AFP: Neg     : Antibody:NEG (04/17 1106) Anatomic US  Nml @ 19 wks, limited > rescan in 4-6 wks-full eval of heart unable to obtain> f/u US  nml and complete except RVOT not visualized Rubella:    Family history of depression  04/10/2015   both parents    Family history of depression 04/10/2015   both parents    Family history of diabetes mellitus in first degree relative 04/10/2015   mother    Family history of heart disease 04/10/2015   Family history of heart disease 04/10/2015   Gout    History of abdominoplasty 04/10/2015   History of breast augmentation 04/10/2015   History of cholecystectomy 04/10/2015   History of goiter 04/10/2015   History of PCOS 04/10/2015   History of tonsillectomy 04/10/2015   Prediabetes    Raynaud disease     Past Surgical History:  Procedure Laterality Date   BREAST ENHANCEMENT SURGERY     CHOLECYSTECTOMY     TONSILLECTOMY     TUMMY TUCK      Current Outpatient Medications on File Prior to Visit  Medication Sig Dispense Refill   levonorgestrel  (MIRENA ) 20 MCG/DAY IUD 1 each by Intrauterine route once.     MELATONIN PO Take 2.5 mg by mouth at bedtime.     sertraline  (ZOLOFT ) 50 MG tablet Take 1 tablet (50 mg total) by mouth daily. 90 tablet 0   No current facility-administered medications on file prior to visit.    No Known Allergies  Social History:  reports that  she has never smoked. She has never been exposed to tobacco smoke. She has never used smokeless tobacco. She reports that she does not currently use alcohol. She reports that she does not use drugs.  Family History  Problem Relation Age of Onset   Hypertension Mother    Depression Mother    Diabetes Mother    Kidney disease Mother    Depression Father    Hyperlipidemia Father    Hypertension Father    Colon cancer Maternal Uncle    Cancer Maternal Uncle    Cancer Paternal Aunt    Skin cancer Maternal Grandmother    Lupus Maternal Grandfather    Edema Paternal Grandmother    Anemia Paternal Grandmother    Skin cancer Cousin    Cancer Cousin    Lymphoma Neg Hx    Leukemia Neg Hx     The following portions of the patient's history were reviewed and updated as appropriate: allergies,  current medications, past family history, past medical history, past social history, past surgical history and problem list.  Review of Systems Pertinent items noted in HPI and remainder of comprehensive ROS otherwise negative.  Physical Exam:  BP 129/84   Pulse 80   Ht 5' 10.5" (1.791 m)   Wt (!) 345 lb (156.5 kg)   BMI 48.80 kg/m  CONSTITUTIONAL: Well-developed, well-nourished female in no acute distress.  HENT:  Normocephalic, atraumatic, External right and left ear normal.  EYES: Conjunctivae and EOM are normal. Pupils are equal, round, and reactive to light. No scleral icterus.  NECK: Normal range of motion, supple, no masses observed. SKIN: Skin is warm and dry. No rash noted. Not diaphoretic. No erythema. No pallor. MUSCULOSKELETAL: Normal range of motion. No tenderness.  No cyanosis, clubbing, or edema. NEUROLOGIC: Alert and oriented to person, place, and time. Normal muscle tone coordination.  PSYCHIATRIC: Normal mood and affect. Normal behavior. Normal judgment and thought content. CARDIOVASCULAR: Normal heart rate noted, regular rhythm RESPIRATORY: Clear to auscultation bilaterally. Effort and breath sounds normal, no problems with respiration noted. BREASTS: Symmetric in size. No masses, tenderness, skin changes, nipple drainage, or lymphadenopathy bilaterally. Performed in the presence of a chaperone. ABDOMEN: Soft, no distention noted.  No tenderness, rebound or guarding.  PELVIC: Normal appearing external genitalia and urethral meatus;   Normal uterine size, no other palpable masses, no uterine or adnexal tenderness.  Performed in the presence of a chaperone.   Assessment and Plan:    1. PCOS (polycystic ovarian syndrome) (Primary)  - IUD in place - HgB A1c - Testosterone - Glucose tolerance, 2 hour- Gold standard for diagnosing Diabetes in PCOS population.  - Very strong family of DM history including mom, dad.  - Start metformin 500 mg PO qPM. Plan to initiate  dosing up to a max of 2000 mg daily. Discussed GI upset.   2. Women's annual routine gynecological examination  Pap due next year  Mammo ordered Continue f/u with PcP     Normal breast examination today, she was advised to perform periodic self breast examinations.   Mammogram scheduled for breast cancer screening.  Routine preventative health maintenance measures emphasized. Please refer to After Visit Summary for other counseling recommendations.     Yarel Rushlow, Juliette Oh, NP [facility] [employer], [facility]

## 2023-06-01 LAB — GLUCOSE TOLERANCE, 2 HOURS
Glucose, 2 hour: 116 mg/dL (ref 70–139)
Glucose, GTT - Fasting: 112 mg/dL — ABNORMAL HIGH (ref 70–99)

## 2023-06-01 LAB — HEMOGLOBIN A1C
Est. average glucose Bld gHb Est-mCnc: 123 mg/dL
Hgb A1c MFr Bld: 5.9 % — ABNORMAL HIGH (ref 4.8–5.6)

## 2023-06-01 LAB — TESTOSTERONE: Testosterone: 27 ng/dL (ref 8–60)

## 2023-06-16 ENCOUNTER — Encounter: Payer: Self-pay | Admitting: Medical-Surgical

## 2023-06-16 ENCOUNTER — Ambulatory Visit (INDEPENDENT_AMBULATORY_CARE_PROVIDER_SITE_OTHER): Admitting: Medical-Surgical

## 2023-06-16 VITALS — BP 119/80 | HR 72 | Resp 20 | Ht 70.5 in | Wt 336.1 lb

## 2023-06-16 DIAGNOSIS — F411 Generalized anxiety disorder: Secondary | ICD-10-CM | POA: Diagnosis not present

## 2023-06-16 DIAGNOSIS — R1011 Right upper quadrant pain: Secondary | ICD-10-CM

## 2023-06-16 DIAGNOSIS — F321 Major depressive disorder, single episode, moderate: Secondary | ICD-10-CM

## 2023-06-16 MED ORDER — SERTRALINE HCL 50 MG PO TABS
50.0000 mg | ORAL_TABLET | Freq: Every day | ORAL | 3 refills | Status: AC
Start: 2023-06-16 — End: ?

## 2023-06-16 NOTE — Progress Notes (Signed)
        Established patient visit  History, exam, impression, and plan:  1. RUQ pain (Primary) Pleasant 38 year old female presenting today for follow-up on history of intermittent abdominal pain, mostly in the right upper quadrant.  This has been going on over the last couple of years but no etiology has been identified.  Notes that she has pain and pressure in the right upper quadrant that is achy but sometimes sharp and stabbing.  Also has been having some muscle spasms in the ribs around that area.  Has not been able to identify alleviating or aggravating factors.  Has not taken any medications to treat her symptoms.  Notes that she was found to have fatty liver disease during her pregnancy and thinks that this pain may be related to that.  History of cholecystectomy.  On exam, negative CVA tenderness bilaterally.  Abdomen soft, nondistended, bowel sounds positive x 4 quadrants.  Right upper quadrant tenderness just below the ribs but otherwise nontender.  Consider possible fatty liver disease versus musculoskeletal etiology.  It has been sometime since she had imaging completed so plan for right upper quadrant ultrasound.  Adding labs for further evaluation. - CBC with Differential/Platelet - CMP14+EGFR - Sed Rate (ESR) - C-reactive protein - Amylase - Lipase - US  Abdomen Limited RUQ (LIVER/GB); Future  2. Major depressive disorder, single episode, moderate (HCC) 3. Anxiety state Taking sertraline  50 mg daily, tolerating well without side effects.  The medication is working well for her and she is happy with the results.  Keeps her moods stable.  Denies SI/HI.  Mood, affect, thought pattern, cognition, and speech normal today.  Continue sertraline  50 mg daily as prescribed. - sertraline  (ZOLOFT ) 50 MG tablet; Take 1 tablet (50 mg total) by mouth daily.  Dispense: 90 tablet; Refill: 3  Procedures performed this visit: None.  Return if symptoms worsen or fail to  improve.  __________________________________ Maryl Snook, DNP, APRN, FNP-BC Primary Care and Sports Medicine [facility]

## 2023-06-17 ENCOUNTER — Ambulatory Visit: Payer: Self-pay | Admitting: Medical-Surgical

## 2023-06-17 ENCOUNTER — Ambulatory Visit (INDEPENDENT_AMBULATORY_CARE_PROVIDER_SITE_OTHER)

## 2023-06-17 ENCOUNTER — Other Ambulatory Visit: Payer: Self-pay | Admitting: Obstetrics and Gynecology

## 2023-06-17 DIAGNOSIS — E282 Polycystic ovarian syndrome: Secondary | ICD-10-CM

## 2023-06-17 DIAGNOSIS — Z01419 Encounter for gynecological examination (general) (routine) without abnormal findings: Secondary | ICD-10-CM

## 2023-06-17 DIAGNOSIS — Z1231 Encounter for screening mammogram for malignant neoplasm of breast: Secondary | ICD-10-CM | POA: Diagnosis not present

## 2023-06-17 DIAGNOSIS — R1011 Right upper quadrant pain: Secondary | ICD-10-CM

## 2023-06-17 LAB — CBC WITH DIFFERENTIAL/PLATELET
Basophils Absolute: 0.1 10*3/uL (ref 0.0–0.2)
Basos: 1 %
EOS (ABSOLUTE): 0.2 10*3/uL (ref 0.0–0.4)
Eos: 2 %
Hematocrit: 40.8 % (ref 34.0–46.6)
Hemoglobin: 13.9 g/dL (ref 11.1–15.9)
Immature Grans (Abs): 0 10*3/uL (ref 0.0–0.1)
Immature Granulocytes: 0 %
Lymphocytes Absolute: 1.8 10*3/uL (ref 0.7–3.1)
Lymphs: 25 %
MCH: 30.2 pg (ref 26.6–33.0)
MCHC: 34.1 g/dL (ref 31.5–35.7)
MCV: 89 fL (ref 79–97)
Monocytes Absolute: 0.4 10*3/uL (ref 0.1–0.9)
Monocytes: 6 %
Neutrophils Absolute: 4.7 10*3/uL (ref 1.4–7.0)
Neutrophils: 66 %
Platelets: 390 10*3/uL (ref 150–450)
RBC: 4.61 x10E6/uL (ref 3.77–5.28)
RDW: 12.2 % (ref 11.7–15.4)
WBC: 7.2 10*3/uL (ref 3.4–10.8)

## 2023-06-17 LAB — CMP14+EGFR
ALT: 35 IU/L — ABNORMAL HIGH (ref 0–32)
AST: 23 IU/L (ref 0–40)
Albumin: 4.6 g/dL (ref 3.9–4.9)
Alkaline Phosphatase: 93 IU/L (ref 44–121)
BUN/Creatinine Ratio: 21 (ref 9–23)
BUN: 16 mg/dL (ref 6–24)
Bilirubin Total: 0.4 mg/dL (ref 0.0–1.2)
CO2: 21 mmol/L (ref 20–29)
Calcium: 9.5 mg/dL (ref 8.7–10.2)
Chloride: 98 mmol/L (ref 96–106)
Creatinine, Ser: 0.76 mg/dL (ref 0.57–1.00)
Globulin, Total: 2.6 g/dL (ref 1.5–4.5)
Glucose: 103 mg/dL — ABNORMAL HIGH (ref 70–99)
Potassium: 4 mmol/L (ref 3.5–5.2)
Sodium: 137 mmol/L (ref 134–144)
Total Protein: 7.2 g/dL (ref 6.0–8.5)
eGFR: 102 mL/min/{1.73_m2} (ref >59)

## 2023-06-17 LAB — AMYLASE: Amylase: 32 U/L (ref 31–110)

## 2023-06-17 LAB — SEDIMENTATION RATE: Sed Rate: 21 mm/h (ref 0–32)

## 2023-06-17 LAB — LIPASE: Lipase: 26 U/L (ref 14–72)

## 2023-06-17 LAB — C-REACTIVE PROTEIN: CRP: 10 mg/L (ref 0–10)

## 2023-09-21 ENCOUNTER — Telehealth: Admitting: Physician Assistant

## 2023-09-21 DIAGNOSIS — B370 Candidal stomatitis: Secondary | ICD-10-CM

## 2023-09-21 MED ORDER — NYSTATIN 100000 UNIT/ML MT SUSP: 5.0000 mL | Freq: Four times a day (QID) | OROMUCOSAL | 0 refills | Status: DC

## 2023-09-21 NOTE — Progress Notes (Signed)
 E-Visit Treatment for Thrush symptoms  We are sorry that you are not feeling well. Here is how we plan to help!  Based on what you have shared with me, it appears that you may have the start of mild thrush.  Regina Andrews is a fungal (yeast) infection that can grow in your mouth, throat, and other parts of your body. With oral thrush (oral candidiasis), you may develop white, raised, cottage cheese-like lesions (spots) on your tongue and cheeks. Regina Andrews can quickly become irritated and cause mouth pain and redness.  Other symptoms may include: Redness and soreness inside and at the corners of your mouth Loss of sense of taste ([place]) Cottony feeling in your mouth  The lesions can hurt and may bleed a little when you scrape them or brush your teeth. In severe cases, the lesions can spread into your esophagus and cause: Pain or difficulty swallowing A feeling that food gets stuck in your throat or mid-chest area Fever, if the infection spreads beyond your esophagus  Most people have small amounts of the Candida fungus in their mouth, digestive tract and skin. When illnesses, stress or medications disturb this balance, the fungus grows out of control and causes thrush.  Medications that can make yeast flourish and cause infection include: Corticosteroids Inhalers Antibiotics Birth Control Pills  Regina Andrews can be contagious to those at risk (like people with weakened immune systems or who take certain medications). In people with healthy immune systems, it's unusual to pass thrush through kissing or other close contact. In most cases, thrush isn't particularly contagious (meaning, it doesn't spread from person to person), but it is transmittable (meaning, you can catch it in other ways, like through saliva when you are immunocompromised).  AS a precautions, I have prescribed I have prescribed an antifungal - Nystatin  suspension 100,000 units/mL Swish and swallow 5mL every 6 hours for up to 10-14 days  and Antifungals can clear up thrush in one to two weeks. You may need to continue the medication for a few more days to kill any fungus that's left behind.  Prevention: Practice good oral hygiene. See your dentist regularly. This is especially important if you have diabetes or wear dentures. Limit the amount of sugar and yeast-containing foods you eat. Foods such as bread, beer and wine encourage Candida growth. Avoid smoking and other tobacco use. Ask your healthcare provider about ways to help you quit smoking (We do offer a smoking cessation program through the [facility] that you can schedule on your time through MyChart). With treatment, thrush usually goes away within one to two weeks. But if your symptoms linger or get worse, please seek in-person evaluation.  Home Care: Swish with warm saltwater. Take probiotics. Eat yogurt that contains healthy bacteria.  GET HELP RIGHT AWAY IF: Ulcers that are spreading, are very large or particularly painful Ulcers last longer than one week without improving on treatment If you develop a fever, swollen glands and begin to feel unwell  MAKE SURE YOU: Understand these instructions Will watch your condition. Will get help right away if you are not doing well or get worse.  Thank you for choosing an e-visit!  Your e-visit answers were reviewed by a board certified advanced clinical practitioner to complete your personal care plan. Depending upon the condition, your plan could have included both over the counter or prescription medications.  Please review your pharmacy choice. Make sure the pharmacy is open so you can pick up prescription now. If there  is a problem, you may contact your provider through [employer] and have the prescription routed to another pharmacy. Your safety is important to us . If you have drug allergies, check your prescription carefully.  For the next 24 hours you can use MyChart to ask questions  about today's visit, request a non-urgent call back, or ask for a work or school excuse.  You will get an email in the next two days asking about your experience. I hope that your e-visit has been valuable and will speed your recovery.

## 2023-09-21 NOTE — Progress Notes (Signed)
 I have spent 5 minutes in review of e-visit questionnaire, review and updating patient chart, medical decision making and response to patient.   Elsie Velma Lunger, PA-C

## 2023-09-28 ENCOUNTER — Encounter: Payer: Self-pay | Admitting: Sports Medicine

## 2023-10-11 ENCOUNTER — Telehealth: Payer: Self-pay

## 2023-10-11 ENCOUNTER — Ambulatory Visit: Payer: Self-pay

## 2023-10-11 NOTE — Telephone Encounter (Signed)
 FYI Only or Action Required?: FYI only for provider.  Patient was last seen in primary care on 06/16/2023 by Willo Mini, NP.  Called Nurse Triage reporting Dysuria.  Symptoms began a week ago.  Interventions attempted: OTC medications: Azo, cranberry and Rest, hydration, or home remedies.  Symptoms are: gradually worsening.  Triage Disposition: See HCP Within 4 Hours (Or PCP Triage)  Patient/caregiver understands and will follow disposition?: Yes   Pt would also like immunization records.    Copied from CRM [id number]. Topic: Clinical - Red Word Triage >> Oct 11, 2023  8:58 AM Zane F wrote: [facility] that prompted transfer to Nurse Triage:   Concern: Possible UTI   Symptoms: frequency, bladder pressure, consistent burning sensation in vaginal area (Not when urinating)   When did the symptoms start?:4-5 days   What have you done to aid in the concern ? Have you taken anything to assist with the matter?: Yes    If so, what did you take?: Taken AZO cranberry supplement but not seeing relief Reason for Disposition  [1] SEVERE pain with urination (e.g., excruciating) AND [2] not improved after 2 hours of pain medicine  Answer Assessment - Initial Assessment Questions 1. SEVERITY: How bad is the pain?  (e.g., Scale 1-10; mild, moderate, or severe)     5/10  3. PATTERN: Is pain present every time you urinate or just sometimes?      Constant 4. ONSET: When did the painful urination start?      4-5 days ago 5. FEVER: Do you have a fever? If Yes, ask: What is your temperature, how was it measured, and when did it start?     None 7. CAUSE: What do you think is causing the painful urination?  (e.g., UTI, scratch, Herpes sore)     Possible UTI 8. OTHER SYMPTOMS: Do you have any other symptoms? (e.g., blood in urine, flank pain, genital sores, urgency, vaginal discharge)     R flank pain, frequency, urgency, bladder pressure 9. PREGNANCY: Is there any chance  you are pregnant? When was your last menstrual period?     None  Protocols used: Urination Pain - Female-A-AH

## 2023-10-11 NOTE — Telephone Encounter (Signed)
 O.k. to schedule as a nurse visit for TB skin test?

## 2023-10-11 NOTE — Telephone Encounter (Signed)
 Copied from CRM #[id number]. Topic: Clinical - Request for Lab/Test Order >> Oct 11, 2023  9:05 AM Regina Andrews wrote: Reason for CRM:   Patient is calling in to have a TB test order placed for her to get into school. Please place order and contact the patient to schedule after the order has been placed.   Callback Number: [id number]

## 2023-10-11 NOTE — Telephone Encounter (Signed)
 Appointment scheduled.

## 2023-10-12 ENCOUNTER — Encounter: Payer: Self-pay | Admitting: Medical-Surgical

## 2023-10-12 ENCOUNTER — Ambulatory Visit (INDEPENDENT_AMBULATORY_CARE_PROVIDER_SITE_OTHER): Admitting: Medical-Surgical

## 2023-10-12 VITALS — BP 115/74 | HR 83 | Resp 20 | Ht 70.5 in | Wt 304.0 lb

## 2023-10-12 DIAGNOSIS — R300 Dysuria: Secondary | ICD-10-CM

## 2023-10-12 DIAGNOSIS — Z111 Encounter for screening for respiratory tuberculosis: Secondary | ICD-10-CM

## 2023-10-12 LAB — POCT URINALYSIS DIP (CLINITEK)
Bilirubin, UA: NEGATIVE
Glucose, UA: NEGATIVE mg/dL
Ketones, POC UA: NEGATIVE mg/dL
Leukocytes, UA: NEGATIVE
Nitrite, UA: NEGATIVE
POC PROTEIN,UA: 100 — AB
Spec Grav, UA: 1.01 (ref 1.010–1.025)
Urobilinogen, UA: 0.2 U/dL
pH, UA: 7 (ref 5.0–8.0)

## 2023-10-12 MED ORDER — NITROFURANTOIN MONOHYD MACRO 100 MG PO CAPS: 100.0000 mg | ORAL_CAPSULE | Freq: Two times a day (BID) | ORAL | 0 refills | Status: AC

## 2023-10-12 NOTE — Progress Notes (Unsigned)
        Established patient visit   History of Present Illness   Discussed the use of AI scribe software for clinical note transcription with the patient, who gave verbal consent to proceed.  History of Present Illness   Regina Andrews is a 38 year old female who presents with urinary symptoms and right flank pain.  Dysuria and lower urinary tract symptoms - Burning sensation with urination for five days - Increased urinary frequency and urgency - Azo provided no symptom relief - No fever or chills - No history of bubble bath use - Urinates before and after sexual activity  Flank pain - Right flank pain is recurrent - Renal studies in 2021 showed no abnormalities - No history of kidney stones  Gastrointestinal symptoms - Diarrhea for several days      Physical Exam   Physical Exam Vitals reviewed.  Constitutional:      General: She is not in acute distress.    Appearance: Normal appearance. She is not ill-appearing.  HENT:     Head: Normocephalic and atraumatic.  Cardiovascular:     Rate and Rhythm: Normal rate and regular rhythm.     Pulses: Normal pulses.     Heart sounds: Normal heart sounds. No murmur heard.    No friction rub. No gallop.  Pulmonary:     Effort: Pulmonary effort is normal. No respiratory distress.     Breath sounds: Normal breath sounds. No wheezing.  Skin:    General: Skin is warm and dry.  Neurological:     Mental Status: She is alert and oriented to person, place, and time.  Psychiatric:        Mood and Affect: Mood normal.        Behavior: Behavior normal.        Thought Content: Thought content normal.        Judgment: Judgment normal.    Assessment & Plan   Assessment and Plan    Suspected urinary tract infection Urinalysis suggests no clear infection, but symptoms and findings warrant empiric UTI treatment. - Prescribe Macrobid . - Send urine sample for culture. - Perform self-swab to rule out bacterial vaginosis or yeast  infection.  General Health Maintenance Discussed vaccinations and screenings. She is uncomfortable with COVID vaccine due to not meeting age guidelines and low risk status. - Administer flu vaccine. - Perform Quantiferon TB test. - Provide note stating she does not meet qualifications for COVID vaccine.      Follow up   Return if symptoms worsen or fail to improve. __________________________________ Zada FREDRIK Palin, DNP, APRN, FNP-BC Primary Care and Sports Medicine [facility] [place]

## 2023-10-13 ENCOUNTER — Ambulatory Visit: Payer: Self-pay | Admitting: Medical-Surgical

## 2023-10-13 LAB — WET PREP FOR TRICH, YEAST, CLUE
Clue Cell Exam: NEGATIVE
Trichomonas Exam: NEGATIVE
Yeast Exam: NEGATIVE

## 2023-10-14 NOTE — Telephone Encounter (Signed)
 Patient seen in office 10/12/2023

## 2023-10-15 LAB — QUANTIFERON-TB GOLD PLUS
QuantiFERON Mitogen Value: >10 [IU]/mL
QuantiFERON Nil Value: 0.02 [IU]/mL
QuantiFERON TB1 Ag Value: 0.03 [IU]/mL
QuantiFERON TB2 Ag Value: 0.03 [IU]/mL
